# Patient Record
Sex: Female | Born: 1935 | Race: Black or African American | Hispanic: No | Smoking: Current every day smoker
Health system: Southern US, Community
[De-identification: ages and names within clinical notes are randomized; demographics above are authoritative.]

## PROBLEM LIST (undated history)

## (undated) DIAGNOSIS — I10 Essential (primary) hypertension: Secondary | ICD-10-CM

## (undated) DIAGNOSIS — K644 Residual hemorrhoidal skin tags: Secondary | ICD-10-CM

## (undated) DIAGNOSIS — R7303 Prediabetes: Secondary | ICD-10-CM

## (undated) DIAGNOSIS — C50919 Malignant neoplasm of unspecified site of unspecified female breast: Secondary | ICD-10-CM

## (undated) DIAGNOSIS — R42 Dizziness and giddiness: Secondary | ICD-10-CM

## (undated) DIAGNOSIS — R0600 Dyspnea, unspecified: Secondary | ICD-10-CM

## (undated) DIAGNOSIS — R001 Bradycardia, unspecified: Secondary | ICD-10-CM

## (undated) DIAGNOSIS — K219 Gastro-esophageal reflux disease without esophagitis: Secondary | ICD-10-CM

## (undated) DIAGNOSIS — Z8719 Personal history of other diseases of the digestive system: Secondary | ICD-10-CM

## (undated) DIAGNOSIS — K5790 Diverticulosis of intestine, part unspecified, without perforation or abscess without bleeding: Secondary | ICD-10-CM

## (undated) DIAGNOSIS — E785 Hyperlipidemia, unspecified: Secondary | ICD-10-CM

## (undated) DIAGNOSIS — M069 Rheumatoid arthritis, unspecified: Secondary | ICD-10-CM

## (undated) DIAGNOSIS — R0609 Other forms of dyspnea: Secondary | ICD-10-CM

## (undated) DIAGNOSIS — R002 Palpitations: Secondary | ICD-10-CM

## (undated) DIAGNOSIS — R195 Other fecal abnormalities: Secondary | ICD-10-CM

## (undated) DIAGNOSIS — R519 Headache, unspecified: Secondary | ICD-10-CM

## (undated) DIAGNOSIS — R51 Headache: Secondary | ICD-10-CM

## (undated) DIAGNOSIS — R55 Syncope and collapse: Secondary | ICD-10-CM

## (undated) DIAGNOSIS — H269 Unspecified cataract: Secondary | ICD-10-CM

## (undated) DIAGNOSIS — Z8709 Personal history of other diseases of the respiratory system: Secondary | ICD-10-CM

## (undated) HISTORY — DX: Personal history of other diseases of the digestive system: Z87.19

## (undated) HISTORY — DX: Rheumatoid arthritis, unspecified: M06.9

## (undated) HISTORY — PX: EYE SURGERY: SHX253

## (undated) HISTORY — DX: Essential (primary) hypertension: I10

## (undated) HISTORY — DX: Unspecified cataract: H26.9

## (undated) HISTORY — PX: TONSILLECTOMY: SUR1361

## (undated) HISTORY — DX: Gastro-esophageal reflux disease without esophagitis: K21.9

## (undated) HISTORY — PX: POLYPECTOMY: SHX149

---

## 1968-01-24 HISTORY — PX: VAGINAL HYSTERECTOMY: SUR661

## 1991-01-24 HISTORY — PX: CYSTECTOMY: SUR359

## 1993-01-23 HISTORY — PX: MASTECTOMY: SHX3

## 1994-01-23 DIAGNOSIS — C50919 Malignant neoplasm of unspecified site of unspecified female breast: Secondary | ICD-10-CM

## 1994-01-23 HISTORY — DX: Malignant neoplasm of unspecified site of unspecified female breast: C50.919

## 1997-07-16 ENCOUNTER — Other Ambulatory Visit: Admission: RE | Admit: 1997-07-16 | Discharge: 1997-07-16 | Payer: Self-pay | Admitting: Hematology and Oncology

## 2000-04-19 ENCOUNTER — Encounter: Payer: Self-pay | Admitting: Emergency Medicine

## 2000-04-19 ENCOUNTER — Emergency Department (HOSPITAL_COMMUNITY): Admission: EM | Admit: 2000-04-19 | Discharge: 2000-04-19 | Payer: Self-pay | Admitting: Emergency Medicine

## 2000-05-24 ENCOUNTER — Encounter: Admission: RE | Admit: 2000-05-24 | Discharge: 2000-08-22 | Payer: Self-pay | Admitting: Orthopedic Surgery

## 2000-07-04 ENCOUNTER — Encounter: Payer: Self-pay | Admitting: General Surgery

## 2000-07-04 ENCOUNTER — Encounter: Admission: RE | Admit: 2000-07-04 | Discharge: 2000-07-04 | Payer: Self-pay | Admitting: General Surgery

## 2001-05-01 ENCOUNTER — Encounter: Admission: RE | Admit: 2001-05-01 | Discharge: 2001-05-01 | Payer: Self-pay | Admitting: Internal Medicine

## 2001-05-01 ENCOUNTER — Encounter (HOSPITAL_BASED_OUTPATIENT_CLINIC_OR_DEPARTMENT_OTHER): Payer: Self-pay | Admitting: Internal Medicine

## 2001-11-28 ENCOUNTER — Encounter: Admission: RE | Admit: 2001-11-28 | Discharge: 2002-01-06 | Payer: Self-pay | Admitting: Internal Medicine

## 2003-01-24 DIAGNOSIS — R195 Other fecal abnormalities: Secondary | ICD-10-CM

## 2003-01-24 HISTORY — DX: Other fecal abnormalities: R19.5

## 2003-06-12 ENCOUNTER — Emergency Department (HOSPITAL_COMMUNITY): Admission: EM | Admit: 2003-06-12 | Discharge: 2003-06-12 | Payer: Self-pay | Admitting: Emergency Medicine

## 2003-06-30 ENCOUNTER — Encounter: Admission: RE | Admit: 2003-06-30 | Discharge: 2003-06-30 | Payer: Self-pay | Admitting: Internal Medicine

## 2005-06-06 ENCOUNTER — Ambulatory Visit: Payer: Self-pay | Admitting: Internal Medicine

## 2005-06-12 ENCOUNTER — Ambulatory Visit: Payer: Self-pay | Admitting: Internal Medicine

## 2005-06-12 ENCOUNTER — Encounter (INDEPENDENT_AMBULATORY_CARE_PROVIDER_SITE_OTHER): Payer: Self-pay | Admitting: *Deleted

## 2006-01-20 ENCOUNTER — Emergency Department (HOSPITAL_COMMUNITY): Admission: EM | Admit: 2006-01-20 | Discharge: 2006-01-20 | Payer: Self-pay | Admitting: Emergency Medicine

## 2006-08-31 ENCOUNTER — Ambulatory Visit (HOSPITAL_COMMUNITY): Admission: RE | Admit: 2006-08-31 | Discharge: 2006-08-31 | Payer: Self-pay | Admitting: Internal Medicine

## 2008-03-26 ENCOUNTER — Observation Stay (HOSPITAL_COMMUNITY): Admission: EM | Admit: 2008-03-26 | Discharge: 2008-03-27 | Payer: Self-pay | Admitting: Emergency Medicine

## 2010-02-13 ENCOUNTER — Encounter: Payer: Self-pay | Admitting: Diagnostic Radiology

## 2010-05-05 LAB — DIFFERENTIAL
Basophils Absolute: 0 10*3/uL (ref 0.0–0.1)
Basophils Relative: 0 % (ref 0–1)
Eosinophils Absolute: 0 10*3/uL (ref 0.0–0.7)
Eosinophils Relative: 0 % (ref 0–5)
Monocytes Absolute: 0.2 10*3/uL (ref 0.1–1.0)
Neutrophils Relative %: 90 % — ABNORMAL HIGH (ref 43–77)

## 2010-05-05 LAB — CARDIAC PANEL(CRET KIN+CKTOT+MB+TROPI)
CK, MB: 2.2 ng/mL (ref 0.3–4.0)
CK, MB: 3.7 ng/mL (ref 0.3–4.0)
Relative Index: 0.7 (ref 0.0–2.5)
Relative Index: 1 (ref 0.0–2.5)
Troponin I: <0.01 ng/mL (ref 0.00–0.06)

## 2010-05-05 LAB — POCT CARDIAC MARKERS: Myoglobin, poc: 98.4 ng/mL (ref 12–200)

## 2010-05-05 LAB — CBC
HCT: 36 % (ref 36.0–46.0)
Hemoglobin: 12.1 g/dL (ref 12.0–15.0)
MCHC: 33.7 g/dL (ref 30.0–36.0)
MCV: 93.2 fL (ref 78.0–100.0)
RBC: 3.86 MIL/uL — ABNORMAL LOW (ref 3.87–5.11)
WBC: 9.5 10*3/uL (ref 4.0–10.5)

## 2010-05-05 LAB — BASIC METABOLIC PANEL
BUN: 10 mg/dL (ref 6–23)
GFR calc Af Amer: >60 mL/min (ref >60)
GFR calc non Af Amer: >60 mL/min (ref >60)
Potassium: 3.4 mEq/L — ABNORMAL LOW (ref 3.5–5.1)

## 2010-05-05 LAB — CK TOTAL AND CKMB (NOT AT ARMC): Total CK: 92 U/L (ref 7–177)

## 2010-05-05 LAB — PROTIME-INR: INR: 1.1 (ref 0.00–1.49)

## 2010-06-07 NOTE — Discharge Summary (Signed)
NAME:  Carol Bright, Carol Bright NO.:  [id number]   MEDICAL RECORD NO.:  [id number]          PATIENT TYPE:  INP   LOCATION:  3707                         FACILITY:  [facility]   PHYSICIAN:  Barry Dienes. Eloise Harman, M.D.DATE OF BIRTH:  05/29/1930   DATE OF ADMISSION:  03/26/2008  DATE OF DISCHARGE:  03/27/2008                               DISCHARGE SUMMARY   PERTINENT FINDINGS:  The patient is a 77 year old African American woman  who has had urticaria for the past few days.  Today, she awoke with  intermittent substernal chest pressure, nausea, and a mildly productive  cough.  The chest pressure was mild and would last less than 5 seconds  and resolved spontaneously.  It was not worse with exertion.  The chest  discomfort had resolved by the time I saw her in the emergency room.  She has no history of coronary artery disease and there is no family  history of premature coronary artery disease.   PAST MEDICAL HISTORY:  In 1996, left breast carcinoma, tobacco use that  is ongoing, and rheumatoid arthritis as well as hypertension.   MEDICATIONS PRIOR TO ADMISSION:  1. Benadryl 50 mg p.o. q.i.d. p.r.n. itching.  2. Folate 2 mg p.o. daily.  3. Methotrexate 2.5 mg 8 tablets p.o. once weekly.  4. Prednisone 5 mg daily, long term.  5. Maxzide 25 one tab p.o. daily.  6. Caltrate D 600 one tab twice daily.  7. Centrum Silver 1 tab daily.   ALLERGIES:  PENICILLIN and CARDIZEM has been associated with dizziness  and LOVASTATIN has been associated with leg pain.   PAST SURGICAL HISTORY:  In 1971, total abdominal hysterectomy; in 1993,  left shoulder lipoma resection; in 1996, left modified radical  mastectomy; and in 2002, hemorrhoidectomy.   FAMILY HISTORY:  Her mother died at age 9 of myocardial infarction.  She also has had a history of breast cancer.  There is no family history  of premature coronary artery disease.   SOCIAL HISTORY:  She has been a widow since March 25, 2003.  She  is  retired.  She has ongoing tobacco use of 2 cigarettes daily and no  history of alcohol abuse.  She lives with her daughter.   REVIEW OF SYSTEMS:  She has had itching, which is moderately severe over  the past 3 days.  She has had a tactile temperature and a mildly  productive cough.  She has not had palpitations, nausea, vomiting,  diarrhea, or constipation.  She has arthralgias that affect both of her  feet and knees and both of her wrists.   INITIAL PHYSICAL EXAMINATION:  VITAL SIGNS:  Blood pressure 127/81,  pulse 88, respirations 24, temperature 97.1, and pulse oxygen saturation  98% on room air.  GENERAL:  She is a well-nourished, well-developed black female who is in  no apparent distress.  HEAD, EYES, EARS, NOSE, AND THROAT:  Within normal limits.  NECK:  Supple without jugular venous distention or carotid bruit.  CHEST:  Clear to auscultation.  HEART:  Regular rate and rhythm with a systolic ejection murmur of  grade  1/6 at the left sternal border.  ABDOMEN:  Normal bowel sounds with no hepatosplenomegaly or tenderness.  EXTREMITIES:  Without cyanosis, clubbing, or edema.  NEUROLOGIC:  She was alert and well oriented with a normal affect,  cranial nerves II-XII were normal, she was able to move all extremities  well.   INITIAL LABORATORY STUDIES:  White blood cell count 9.5, hemoglobin 12,  hematocrit 36, and platelets 306.  Serum sodium 138, potassium 3.4,  chloride 107, carbon dioxide 24, BUN 10, creatinine 0.89, and glucose  121.  Troponin I was less than 0.05.  CK-MB was less than 1.0.  A chest  x-ray showed cardiomegaly only.  EKG showed the following:  1. Normal sinus rhythm.  2. Borderline first-degree AV block.   HOSPITAL COURSE:  The patient was admitted to a medical bed with  telemetry.  She showed no arrhythmia while on telemetry.  She had serial  cardiac isoenzymes including a total CPK levels of 92, 227, and 536 with  CK-MB levels of 1.1, 2.2, and 3.7  respectively.  She also had serum  troponin I levels of less than 0.05, less than 0.01, and less than 0.01.  She had some diffuse body itching that improved and it eventually  resolved with high-dose Solu-Medrol.  She had no recurrence of her  substernal chest pain.  She has been eating well and walking well  without difficulty.  Most recent physical exam includes vital signs of  blood pressure 112/61, pulse 76, respirations 20, and temperature 98.1  with pulse oxygen saturation 96% on room air.  In general, she is an  elderly black female who is in no apparent distress.  Chest was clear to  auscultation.  Heart had a regular rate and rhythm with a systolic  ejection murmur of grade 1/6 at the left sternal border.  Abdomen had  normal bowel sounds and no hepatosplenomegaly or tenderness.  Extremities were without cyanosis, clubbing, or edema.  Most recent 12-  lead EKG shows normal sinus rhythm with first-degree AV block.   DISCHARGE DIAGNOSES:  1. Chest pain, precordial, with no evidence of myocardial infarction.  2. Urticaria, improved.  3. Acute bronchitis, improved.  4. Emphysema.  5. In 1996, left-sided breast adenocarcinoma.  6. Rheumatoid arthritis.  7. Hypertension.  8. Elevated CPK levels, likely secondary to myositis from whole body      scratching.   DISCHARGE MEDICATIONS:  1. Prednisone 20 mg 3 tablets daily for 2 days, then 2 tablets daily      for 2 days, then 1 tablet daily for 2 days, then 1/2 tablet daily      for 2 days, then resume prednisone 5 mg daily.  2. Benadryl 25 mg p.o. q.i.d. p.r.n. itching.  3. Maxzide 25 one tab p.o. q.a.m.  4. Caltrate D 600 twice daily.  5. Centrum Silver once daily.  6. Folate 2 mg daily.  7. Methotrexate 2.5 mg tablets take 8 tablets once weekly.  8. Ecotrin 81 mg once daily.  9. Albuterol HFA 2 puffs q.i.d. p.r.n. wheezing or cough.  10.Robitussin DM as needed for cough.   DISPOSITION AND FOLLOWUP:  She will be discharged to  home.  She was  advised to schedule a followup appointment with Dr. Eloise Harman in  approximately 1-2 weeks following discharge.           ______________________________  Barry Dienes. Eloise Harman, M.D.     DGP/MEDQ  D:  03/27/2008  T:  03/28/2008  Job:  [id number]   cc:   Aundra Dubin, M.D.

## 2010-06-17 ENCOUNTER — Emergency Department (HOSPITAL_COMMUNITY)
Admission: EM | Admit: 2010-06-17 | Discharge: 2010-06-18 | Disposition: A | Discharge status: Home / Self Care | Payer: Medicare Other | Attending: Emergency Medicine | Admitting: Emergency Medicine

## 2010-06-17 ENCOUNTER — Emergency Department (HOSPITAL_COMMUNITY): Payer: Medicare Other

## 2010-06-17 DIAGNOSIS — E785 Hyperlipidemia, unspecified: Secondary | ICD-10-CM | POA: Insufficient documentation

## 2010-06-17 DIAGNOSIS — M069 Rheumatoid arthritis, unspecified: Secondary | ICD-10-CM | POA: Insufficient documentation

## 2010-06-17 DIAGNOSIS — I10 Essential (primary) hypertension: Secondary | ICD-10-CM | POA: Insufficient documentation

## 2010-06-17 DIAGNOSIS — Z79899 Other long term (current) drug therapy: Secondary | ICD-10-CM | POA: Insufficient documentation

## 2010-06-17 DIAGNOSIS — M25519 Pain in unspecified shoulder: Secondary | ICD-10-CM | POA: Insufficient documentation

## 2010-07-02 ENCOUNTER — Encounter: Payer: Self-pay | Admitting: Internal Medicine

## 2010-12-23 ENCOUNTER — Encounter: Payer: Self-pay | Admitting: Internal Medicine

## 2010-12-26 ENCOUNTER — Ambulatory Visit (INDEPENDENT_AMBULATORY_CARE_PROVIDER_SITE_OTHER): Payer: Medicare Other | Admitting: Internal Medicine

## 2010-12-26 ENCOUNTER — Institutional Professional Consult (permissible substitution): Payer: Medicare Other | Admitting: Internal Medicine

## 2010-12-26 ENCOUNTER — Encounter: Payer: Self-pay | Admitting: Internal Medicine

## 2010-12-26 DIAGNOSIS — F17200 Nicotine dependence, unspecified, uncomplicated: Secondary | ICD-10-CM

## 2010-12-26 DIAGNOSIS — R0989 Other specified symptoms and signs involving the circulatory and respiratory systems: Secondary | ICD-10-CM

## 2010-12-26 DIAGNOSIS — R079 Chest pain, unspecified: Secondary | ICD-10-CM

## 2010-12-26 DIAGNOSIS — E785 Hyperlipidemia, unspecified: Secondary | ICD-10-CM

## 2010-12-26 DIAGNOSIS — Z720 Tobacco use: Secondary | ICD-10-CM

## 2010-12-26 DIAGNOSIS — I10 Essential (primary) hypertension: Secondary | ICD-10-CM

## 2010-12-26 NOTE — Progress Notes (Signed)
Patient ID: Carol Bright, female   DOB: [birth year], 77 y.o.   MRN: [id number] HPI  Patient is a 77 year old who is followed by D. Jarold Motto For the past 2 months has had intermitt Chest pains with exertion (mopping)  Does have shoulder pain on L side. Gets occasional SOB with this. At rest can feel occasionally a little something runnign through chest. Doesn't do a lot of walking because of arthritis. Has been on prednisone for years.MTX added  Allergies  Allergen Reactions  . Penicillins     Current Outpatient Prescriptions  Medication Sig Dispense Refill  . cetirizine (ZYRTEC) 10 MG tablet Take 10 mg by mouth daily.        . folic acid (FOLVITE) 1 MG tablet 2 TABS DAILY       . methotrexate (RHEUMATREX) 2.5 MG tablet Take 2.5 mg by mouth once a week. Caution:Chemotherapy. Protect from light. ( Pt takes 10 TABS ON SUN)       . Multiple Vitamins-Minerals (CENTRUM SILVER PO) Take by mouth. 1 TAB DAILY       . NON FORMULARY Vitamin D3  1 tab weekly  50,000 units       . OVER THE COUNTER MEDICATION CALICUM WITH VITAMIN D 1 TAB DAILY       . predniSONE (DELTASONE) 5 MG tablet Take 5 mg by mouth daily.        Marland Kitchen triamterene-hydrochlorothiazide (MAXZIDE-25) 37.5-25 MG per tablet Take 1 tablet by mouth daily.          Past Medical History  Diagnosis Date  . Hypertension   . Rheumatoid arthritis     PT IS FOLLOWED BY DR Miachel Roux [facility]  . Cancer     BREAST CANCER -LEFT BREAST REMOVED -  1996    History reviewed. No pertinent past surgical history.  Family History  Problem Relation Age of Onset  . Cancer Mother     LEFT BREAST REMOVED  . Heart attack Mother   . Heart disease Mother     History   Social History  . Marital Status: Widowed    Spouse Name: N/A    Number of Children: N/A  . Years of Education: N/A   Occupational History  . Not on file.   Social History Main Topics  . Smoking status: Current Everyday Smoker -- 0.5 packs/day    Types: Cigarettes  .  Smokeless tobacco: Not on file  . Alcohol Use:   . Drug Use: No  . Sexually Active: No   Other Topics Concern  . Not on file   Social History Narrative  . No narrative on file    Review of Systems:  All systems reviewed.  They are negative to the above problem except as previously stated.  Vital Signs: BP 148/81  Pulse 70  Ht 4\' 11"  (1.499 m)  Wt 141 lb (63.957 kg)  BMI 28.48 kg/m2  Physical Exam Patient is in NAD.  HEENT:  Normocephalic, atraumatic. EOMI, PERRLA.  Neck: JVP is normal. No thyromegaly. Question R bruit Lungs: clear to auscultation. No rales no wheezes.  Heart: Regular rate and rhythm. Normal S1, S2. No S3.   No significant murmurs. PMI not displaced. Chest:  Some tenderness with movement of chest.  Abdomen:  Supple, nontender. Normal bowel sounds. No masses. No hepatomegaly.  Extremities:   Good distal pulses throughout. No lower extremity edema.  Musculoskeletal :moving all extremities.  Neuro:   alert and oriented x3.  CN II-XII grossly intact.  Assessment and Plan:

## 2010-12-26 NOTE — Patient Instructions (Signed)
Your physician has requested that you have a lexiscan myoview. For further information please visit [email or web address] Please follow instruction sheet, as given.  Your physician has requested that you have a carotid duplex. This test is an ultrasound of the carotid arteries in your neck. It looks at blood flow through these arteries that supply the brain with blood. Allow one hour for this exam. There are no restrictions or special instructions.

## 2010-12-27 DIAGNOSIS — E785 Hyperlipidemia, unspecified: Secondary | ICD-10-CM | POA: Insufficient documentation

## 2010-12-27 DIAGNOSIS — I10 Essential (primary) hypertension: Secondary | ICD-10-CM | POA: Insufficient documentation

## 2010-12-27 DIAGNOSIS — R0789 Other chest pain: Secondary | ICD-10-CM | POA: Insufficient documentation

## 2010-12-27 DIAGNOSIS — Z720 Tobacco use: Secondary | ICD-10-CM | POA: Insufficient documentation

## 2010-12-27 NOTE — Assessment & Plan Note (Signed)
Follow

## 2010-12-27 NOTE — Assessment & Plan Note (Addendum)
Patient's exertional CP is concerning.  I am not convinced it is cardiac.  There may be association with movement of UE/shoulder with her RA.  But, she has longstanding prednisone use and her lipids are increased.  She is not that active  She also is quite stoic, question if symptoms are worse. I would recomm a lexiscan myoview to eval for ischemia.  acitvities as tolerated.

## 2010-12-27 NOTE — Assessment & Plan Note (Signed)
Lipid are not opimal with LDL of 147.  It sounds like she has been on a statin in the past. Question bruit on R ICA region.  Will sched carotid USN to evaluate.

## 2010-12-27 NOTE — Assessment & Plan Note (Signed)
Counselled on cessation. 

## 2011-01-02 ENCOUNTER — Ambulatory Visit (HOSPITAL_COMMUNITY): Payer: Medicare Other | Attending: Internal Medicine | Admitting: Radiology

## 2011-01-02 DIAGNOSIS — M25519 Pain in unspecified shoulder: Secondary | ICD-10-CM | POA: Insufficient documentation

## 2011-01-02 DIAGNOSIS — F17200 Nicotine dependence, unspecified, uncomplicated: Secondary | ICD-10-CM | POA: Insufficient documentation

## 2011-01-02 DIAGNOSIS — R0602 Shortness of breath: Secondary | ICD-10-CM | POA: Insufficient documentation

## 2011-01-02 DIAGNOSIS — I10 Essential (primary) hypertension: Secondary | ICD-10-CM | POA: Insufficient documentation

## 2011-01-02 DIAGNOSIS — R5381 Other malaise: Secondary | ICD-10-CM | POA: Insufficient documentation

## 2011-01-02 DIAGNOSIS — R0609 Other forms of dyspnea: Secondary | ICD-10-CM | POA: Insufficient documentation

## 2011-01-02 DIAGNOSIS — R5383 Other fatigue: Secondary | ICD-10-CM

## 2011-01-02 DIAGNOSIS — R079 Chest pain, unspecified: Secondary | ICD-10-CM

## 2011-01-02 DIAGNOSIS — E785 Hyperlipidemia, unspecified: Secondary | ICD-10-CM | POA: Insufficient documentation

## 2011-01-02 DIAGNOSIS — R42 Dizziness and giddiness: Secondary | ICD-10-CM | POA: Insufficient documentation

## 2011-01-02 DIAGNOSIS — R0989 Other specified symptoms and signs involving the circulatory and respiratory systems: Secondary | ICD-10-CM | POA: Insufficient documentation

## 2011-01-02 MED ORDER — TECHNETIUM TC 99M TETROFOSMIN IV KIT: 33.0000 | PACK | Freq: Once | INTRAVENOUS | Status: DC | PRN

## 2011-01-02 MED ORDER — TECHNETIUM TC 99M TETROFOSMIN IV KIT: 11.0000 | PACK | Freq: Once | INTRAVENOUS | Status: DC | PRN

## 2011-01-02 MED ORDER — TECHNETIUM TC 99M TETROFOSMIN IV KIT
11.0000 | PACK | Freq: Once | INTRAVENOUS | Status: DC | PRN
  Administered 2011-01-02: 11 via INTRAVENOUS

## 2011-01-02 MED ORDER — TECHNETIUM TC 99M TETROFOSMIN IV KIT
33.0000 | PACK | Freq: Once | INTRAVENOUS | Status: DC | PRN
  Administered 2011-01-02: 33 via INTRAVENOUS

## 2011-01-02 MED ORDER — REGADENOSON 0.4 MG/5ML IV SOLN: 0.4000 mg | Freq: Once | INTRAVENOUS | Status: DC

## 2011-01-02 MED ORDER — AMINOPHYLLINE 25 MG/ML IV SOLN
75.0000 mg | Freq: Once | INTRAVENOUS | Status: AC
  Administered 2011-01-02: 75 mg via INTRAVENOUS

## 2011-01-02 MED ORDER — REGADENOSON 0.4 MG/5ML IV SOLN
0.4000 mg | Freq: Once | INTRAVENOUS | Status: AC
  Administered 2011-01-02: 0.4 mg via INTRAVENOUS

## 2011-01-02 NOTE — Progress Notes (Signed)
[facility] SITE 3 NUCLEAR MED [address] [place] [address] [phone]  Cardiology Nuclear Med Study  Carol Bright is a 77 y.o. female [id number] 1930-10-21   Nuclear Med Background Indication for Stress Test:  Evaluation for Ischemia History:  No previous documented CAD Cardiac Risk Factors: Family History - CAD, Hypertension, Lipids and Smoker  Symptoms:  Chest Pressure with Exertion radiates to left Shoulder (last date of chest discomfort yesterday), Dizziness, DOE, Fatigue, Fatigue with Exertion and SOB   Nuclear Pre-Procedure Caffeine/Decaff Intake:  None NPO After: 9:00pm   Lungs:  Clear IV 0.9% NS with Angio Cath:  22g  IV Site: R Forearm x 1,tolerated well IV Started by:  Irean Hong, RN  Chest Size (in):  36 Cup Size: D  Height: 4\' 11"  (1.499 m)  Weight:  140 lb (63.504 kg)  BMI:  Body mass index is 28.28 kg/(m^2). Tech Comments:  N/A    Nuclear Med Study 1 or 2 day study: 1 day  Stress Test Type:  Lexiscan  Reading MD: Dietrich Pates, MD  Order Authorizing Provider:  Dietrich Pates, MD  Resting Radionuclide: Technetium 47m Tetrofosmin  Resting Radionuclide Dose: 11.0 mCi   Stress Radionuclide:  Technetium 44m Tetrofosmin  Stress Radionuclide Dose: 33.0 mCi           Stress Protocol Rest HR: 56 Stress HR: 88  Rest BP: 116/60 Stress BP: 112/78  Exercise Time (min): n/a METS: n/a   Predicted Max HR: 142 bpm % Max HR: 61.97 bpm Rate Pressure Product: 9856   Dose of Adenosine (mg):  n/a Dose of Lexiscan: 0.4 mg  Dose of Atropine (mg): n/a Dose of Dobutamine: n/a mcg/kg/min (at max HR)  Stress Test Technologist: Irean Hong, RN  Nuclear Technologist:  Domenic Polite, CNMT     Rest Procedure:  Myocardial perfusion imaging was performed at rest 45 minutes following the intravenous administration of Technetium 44m Tetrofosmin. Rest ECG: SB with marked sinus arrhythmia, 1st degree AVB, and minimal early replorization  Stress Procedure:   The patient received IV Lexiscan 0.4 mg over 15-seconds.  Technetium 87m Tetrofosmin injected at 30-seconds.  The EKG was non-diagnostic due to baseline changes. There were occasional PAC's, rare PJC.  The patient complained of extreme fatigue, and marked body aches 8 minutes in recovery. Aminophylline 75 mg IV push given with rapid resolution of symptoms.  Quantitative spect images were obtained after a 45 minute delay. Stress ECG: No significant change from baseline ECG  QPS Raw Data Images:  Soft tissue (diaphragm, bowel activity) underlie heart. Stress Images:  Normal homogeneous uptake in all areas of the myocardium. Rest Images:  Normal homogeneous uptake in all areas of the myocardium. Subtraction (SDS):  No evidence of ischemia. Transient Ischemic Dilatation (Normal <1.22):  1.03 Lung/Heart Ratio (Normal <0.45):  0.30  Quantitative Gated Spect Images QGS EDV:  65 ml QGS ESV:  23 ml QGS cine images:  NL LV Function; NL Wall Motion QGS EF: 65%  Impression Exercise Capacity:  Lexiscan with no exercise. BP Response:  Normal blood pressure response. Clinical Symptoms:  Significant chest pain. ECG Impression:  No significant ST segment change suggestive of ischemia. Comparison with Prior Nuclear Study: No previous nuclear study performed  Overall Impression:  Normal stress nuclear study. LVEF 65% Dietrich Pates

## 2011-01-23 ENCOUNTER — Encounter (INDEPENDENT_AMBULATORY_CARE_PROVIDER_SITE_OTHER): Payer: Medicare Other | Admitting: *Deleted

## 2011-01-23 DIAGNOSIS — I6529 Occlusion and stenosis of unspecified carotid artery: Secondary | ICD-10-CM

## 2011-01-23 DIAGNOSIS — R0989 Other specified symptoms and signs involving the circulatory and respiratory systems: Secondary | ICD-10-CM

## 2011-02-03 ENCOUNTER — Other Ambulatory Visit: Payer: Self-pay | Admitting: *Deleted

## 2011-02-03 DIAGNOSIS — E782 Mixed hyperlipidemia: Secondary | ICD-10-CM

## 2011-02-03 MED ORDER — SIMVASTATIN 20 MG PO TABS: 20.0000 mg | ORAL_TABLET | Freq: Every evening | ORAL | Status: DC

## 2011-04-04 ENCOUNTER — Other Ambulatory Visit (INDEPENDENT_AMBULATORY_CARE_PROVIDER_SITE_OTHER): Payer: Medicare Other

## 2011-04-04 DIAGNOSIS — E782 Mixed hyperlipidemia: Secondary | ICD-10-CM

## 2011-04-04 LAB — LIPID PANEL
LDL Cholesterol: 109 mg/dL — ABNORMAL HIGH (ref 0–99)
Total CHOL/HDL Ratio: 3
Triglycerides: 59 mg/dL (ref 0.0–149.0)

## 2011-04-04 LAB — AST: AST: 18 U/L (ref 0–37)

## 2011-04-19 ENCOUNTER — Other Ambulatory Visit: Payer: Self-pay | Admitting: *Deleted

## 2011-04-19 DIAGNOSIS — E7800 Pure hypercholesterolemia, unspecified: Secondary | ICD-10-CM

## 2011-04-19 MED ORDER — ATORVASTATIN CALCIUM 20 MG PO TABS: 20.0000 mg | ORAL_TABLET | Freq: Every day | ORAL | Status: DC

## 2011-05-08 ENCOUNTER — Telehealth: Payer: Self-pay | Admitting: *Deleted

## 2011-05-08 NOTE — Telephone Encounter (Signed)
Patient's daughter had reported that she was feeling achy in her joints since starting Lipitor. Dr.Ross advised that we could hold medication and check response in a few weeks. I called the patient and she told me that she thought it was her arthritis that had flared up because now she feels fine. Will stay on Lipitor and let us know if anything changes. I will let Dr.Ross know about update.

## 2011-06-04 ENCOUNTER — Emergency Department (INDEPENDENT_AMBULATORY_CARE_PROVIDER_SITE_OTHER): Payer: Medicare Other

## 2011-06-04 ENCOUNTER — Encounter (HOSPITAL_COMMUNITY): Payer: Self-pay | Admitting: *Deleted

## 2011-06-04 ENCOUNTER — Emergency Department (HOSPITAL_COMMUNITY)
Admission: EM | Admit: 2011-06-04 | Discharge: 2011-06-04 | Disposition: A | Discharge status: Home / Self Care | Payer: Medicare Other | Source: Home / Self Care | Attending: Family Medicine | Admitting: Family Medicine

## 2011-06-04 DIAGNOSIS — M67919 Unspecified disorder of synovium and tendon, unspecified shoulder: Secondary | ICD-10-CM

## 2011-06-04 DIAGNOSIS — M719 Bursopathy, unspecified: Secondary | ICD-10-CM

## 2011-06-04 DIAGNOSIS — M7552 Bursitis of left shoulder: Secondary | ICD-10-CM

## 2011-06-04 DIAGNOSIS — J410 Simple chronic bronchitis: Secondary | ICD-10-CM

## 2011-06-04 DIAGNOSIS — J40 Bronchitis, not specified as acute or chronic: Secondary | ICD-10-CM

## 2011-06-04 HISTORY — DX: Hyperlipidemia, unspecified: E78.5

## 2011-06-04 MED ORDER — DOXYCYCLINE HYCLATE 100 MG PO CAPS: 100.0000 mg | ORAL_CAPSULE | Freq: Two times a day (BID) | ORAL | Status: AC

## 2011-06-04 MED ORDER — DEXTROMETHORPHAN POLISTIREX 30 MG/5ML PO LQCR: 60.0000 mg | ORAL | Status: AC | PRN

## 2011-06-04 MED ORDER — DICLOFENAC SODIUM 1 % TD GEL: 1.0000 "application " | Freq: Four times a day (QID) | TRANSDERMAL | Status: DC

## 2011-06-04 NOTE — Discharge Instructions (Signed)
Take all of medicine, drink lots of fluids, no more smoking, see your doctor if further problems  °

## 2011-06-04 NOTE — ED Provider Notes (Signed)
History     CSN: [id number]  Arrival date & time 06/04/11  7829   First MD Initiated Contact with Patient 06/04/11 [phone]      Chief Complaint  Patient presents with  . Shoulder Pain  . Cough    (Consider location/radiation/quality/duration/timing/severity/associated sxs/prior treatment) Patient is a 78 y.o. female presenting with shoulder pain and cough. The history is provided by the patient and a relative.  Shoulder Pain This is a new problem. The current episode started more than 1 week ago. The problem has been gradually worsening. Associated symptoms include chest pain. Associated symptoms comments: Bad cough, using 3 bottles robitussin, is a smoker.. The symptoms are aggravated by coughing.  Cough Associated symptoms include chest pain.    Past Medical History  Diagnosis Date  . Hypertension   . Cancer     BREAST CANCER -LEFT BREAST REMOVED -  1996  . Rheumatoid arthritis     PT IS FOLLOWED BY DR Miachel Roux [facility]  . Hyperlipidemia     Past Surgical History  Procedure Date  . Mastectomy     Left  . Cystectomy     Left shoulder    Family History  Problem Relation Age of Onset  . Cancer Mother     LEFT BREAST REMOVED  . Heart attack Mother   . Heart disease Mother     History  Substance Use Topics  . Smoking status: Current Everyday Smoker -- 0.2 packs/day    Types: Cigarettes  . Smokeless tobacco: Not on file  . Alcohol Use: Yes     occasional    OB History    Grav Para Term Preterm Abortions TAB SAB Ect Mult Living                  Review of Systems  Constitutional: Negative.   HENT: Negative.   Respiratory: Positive for cough.   Cardiovascular: Positive for chest pain.  Gastrointestinal: Negative.   Musculoskeletal: Positive for joint swelling.  Skin: Negative.     Allergies  Penicillins  Home Medications   Current Outpatient Rx  Name Route Sig Dispense Refill  . ATORVASTATIN CALCIUM 20 MG PO TABS Oral Take 1 tablet (20  mg total) by mouth daily. 30 tablet 6  . FOLIC ACID 1 MG PO TABS  2 TABS DAILY     . METHOTREXATE 2.5 MG PO TABS Oral Take 2.5 mg by mouth once a week. Caution:Chemotherapy. Protect from light. ( Pt takes 10 TABS ON SUN)     . NON FORMULARY  Vitamin D3  1 tab weekly  50,000 units     . OVER THE COUNTER MEDICATION  CALICUM WITH VITAMIN D 1 TAB DAILY     . PREDNISONE 5 MG PO TABS Oral Take 1 mg by mouth 3 (three) times daily.     . TRIAMTERENE-HCTZ 37.5-25 MG PO TABS Oral Take 1 tablet by mouth daily.      Marland Kitchen CETIRIZINE HCL 10 MG PO TABS Oral Take 10 mg by mouth daily.      Marland Kitchen DEXTROMETHORPHAN POLISTIREX ER 30 MG/5ML PO LQCR Oral Take 10 mLs (60 mg total) by mouth as needed for cough. 89 mL 0  . DICLOFENAC SODIUM 1 % TD GEL Topical Apply 1 application topically 4 (four) times daily. 4gm  Per dose,    Please instruct in dosing 1 Tube 1  . DOXYCYCLINE HYCLATE 100 MG PO CAPS Oral Take 1 capsule (100 mg total) by mouth 2 (two) times daily.  20 capsule 0  . CENTRUM SILVER PO Oral Take by mouth. 1 TAB DAILY       BP 140/61  Pulse 58  Temp(Src) 98.5 F (36.9 C) (Oral)  Resp 18  SpO2 95%  Physical Exam  Nursing note and vitals reviewed. Constitutional: She is oriented to person, place, and time. She appears well-developed and well-nourished.  Cardiovascular: Normal rate, regular rhythm, normal heart sounds and intact distal pulses.   Pulmonary/Chest: Breath sounds normal.  Musculoskeletal: She exhibits tenderness.       Left shoulder: She exhibits decreased range of motion, tenderness, bony tenderness and swelling. She exhibits normal pulse and normal strength.  Neurological: She is alert and oriented to person, place, and time.  Skin: Skin is warm and dry.    ED Course  Procedures (including critical care time)  Labs Reviewed - No data to display Dg Chest 2 View  06/04/2011  *RADIOLOGY REPORT*  Clinical Data: Cough, shoulder pain  CHEST - 2 VIEW  Comparison: 03/26/2008  Findings:  Cardiomegaly again noted.  No acute infiltrate or pleural effusion.  No pulmonary edema.  Mild hyperinflation.  Stable mild degenerative changes thoracic spine.  IMPRESSION: No active disease.  No significant change.  Original Report Authenticated By: Natasha Mead, M.D.   Dg Shoulder Left  06/04/2011  *RADIOLOGY REPORT*  Clinical Data: Swelling, pain  LEFT SHOULDER - 2+ VIEW  Comparison: 01/20/2006  Findings: Three views of the left shoulder submitted.  No acute fracture or subluxation.  Mild degenerative changes left AC joint. Stable mild exostosis or spurring left humeral head.  IMPRESSION: No acute fracture or subluxation.  Mild degenerative changes left AC joint.  Original Report Authenticated By: Natasha Mead, M.D.     1. Bursitis of left shoulder   2. Bronchitis due to tobacco use       MDM  X-rays reviewed and report per radiologist.         Linna Hoff, MD 06/04/11 1051

## 2011-06-04 NOTE — ED Notes (Signed)
Denies injury.  C/O left shoulder pain starting beginning of last week with significant worsening since yesterday.  + swelling to left shoulder with decreased ROM.  Also c/o persistent cough x 1.5 wks - family states patient "has gone through 2 bottles Robitussin".  Has been taking Tyl for pain.

## 2011-07-12 ENCOUNTER — Other Ambulatory Visit (INDEPENDENT_AMBULATORY_CARE_PROVIDER_SITE_OTHER): Payer: Medicare Other

## 2011-07-12 DIAGNOSIS — E7800 Pure hypercholesterolemia, unspecified: Secondary | ICD-10-CM

## 2011-07-12 LAB — LIPID PANEL
Cholesterol: 188 mg/dL (ref 0–200)
LDL Cholesterol: 106 mg/dL — ABNORMAL HIGH (ref 0–99)
Triglycerides: 82 mg/dL (ref 0.0–149.0)

## 2011-07-12 LAB — AST: AST: 15 U/L (ref 0–37)

## 2011-07-13 ENCOUNTER — Other Ambulatory Visit: Payer: Medicare Other

## 2011-07-21 ENCOUNTER — Other Ambulatory Visit: Payer: Self-pay | Admitting: *Deleted

## 2011-10-19 ENCOUNTER — Encounter: Payer: Self-pay | Admitting: Internal Medicine

## 2011-10-25 ENCOUNTER — Encounter: Payer: Self-pay | Admitting: Internal Medicine

## 2011-11-24 HISTORY — PX: COLONOSCOPY: SHX174

## 2011-12-05 ENCOUNTER — Ambulatory Visit (AMBULATORY_SURGERY_CENTER): Payer: Medicare Other

## 2011-12-05 VITALS — Ht 59.0 in | Wt 138.7 lb

## 2011-12-05 DIAGNOSIS — Z86010 Personal history of colonic polyps: Secondary | ICD-10-CM

## 2011-12-05 DIAGNOSIS — Z1211 Encounter for screening for malignant neoplasm of colon: Secondary | ICD-10-CM

## 2011-12-05 MED ORDER — MOVIPREP 100 G PO SOLR: ORAL | Status: DC

## 2011-12-19 ENCOUNTER — Ambulatory Visit (AMBULATORY_SURGERY_CENTER): Payer: Medicare Other | Admitting: Internal Medicine

## 2011-12-19 ENCOUNTER — Encounter: Payer: Self-pay | Admitting: Internal Medicine

## 2011-12-19 VITALS — BP 174/77 | HR 47 | Temp 98.4°F | Resp 23 | Ht 59.0 in | Wt 138.0 lb

## 2011-12-19 DIAGNOSIS — Z86010 Personal history of colonic polyps: Secondary | ICD-10-CM

## 2011-12-19 DIAGNOSIS — Z1211 Encounter for screening for malignant neoplasm of colon: Secondary | ICD-10-CM

## 2011-12-19 MED ORDER — SODIUM CHLORIDE 0.9 % IV SOLN: 500.0000 mL | INTRAVENOUS | Status: DC

## 2011-12-19 NOTE — Progress Notes (Signed)
1056 AOX3 . Pleased with MAC. Report to April RN

## 2011-12-19 NOTE — Progress Notes (Signed)
Patient did not experience any of the following events: a burn prior to discharge; a fall within the facility; wrong site/side/patient/procedure/implant event; or a hospital transfer or hospital admission upon discharge from the facility. (G8907) Patient did not have preoperative order for IV antibiotic SSI prophylaxis. (G8918)   Charted by April Mirts RN 

## 2011-12-19 NOTE — Patient Instructions (Addendum)

## 2011-12-19 NOTE — Progress Notes (Signed)
Pt. Left breast prosthesis in bag under stretcher.

## 2011-12-19 NOTE — Op Note (Signed)
[facility] [address] [place], [address]   COLONOSCOPY PROCEDURE REPORT  PATIENT: Carol Bright, Carol Bright  MR#: [id number] BIRTHDATE: 1930-08-26 , 81  yrs. old GENDER: Female ENDOSCOPIST: Roxy Cedar, MD REFERRED AO:[id number] Program Recall PROCEDURE DATE:  12/19/2011 PROCEDURE:   Colonoscopy, surveillance ASA CLASS:   Class II INDICATIONS:Patient's personal history of adenomatous colon polyps - index 2004; f/u 2007 w/ small adenomas. MEDICATIONS: MAC sedation, administered by CRNA and propofol (Diprivan) 150mg  IV  DESCRIPTION OF PROCEDURE:   After the risks benefits and alternatives of the procedure were thoroughly explained, informed consent was obtained.  A digital rectal exam revealed no abnormalities of the rectum.   The LB [id number]-[id number]  endoscope was introduced through the anus and advanced to the cecum, which was identified by both the appendix and ileocecal valve. No adverse events experienced.   The quality of the prep was excellent, using MoviPrep  The instrument was then slowly withdrawn as the colon was fully examined.      COLON FINDINGS: Moderate diverticulosis was noted in the ascending colon, descending colon, and sigmoid colon.   The colon was otherwise normal.  There was no diverticulosis, inflammation, polyps or cancers unless previously stated.  Retroflexed views revealed internal hemorrhoids. The time to cecum=2 minutes 58 seconds.  Withdrawal time=6 minutes 02 seconds.  The scope was withdrawn and the procedure completed. COMPLICATIONS: There were no complications.  ENDOSCOPIC IMPRESSION: 1.   Moderate diverticulosis was noted in the ascending colon, descending colon, and sigmoid colon 2.   The colon was otherwise normal  RECOMMENDATIONS: 1. Return to the care of your primary provider.  GI follow up as needed   eSigned:  Roxy Cedar, MD 12/19/2011 10:56 AM   cc: Jarome Matin, MD and The Patient

## 2011-12-20 ENCOUNTER — Telehealth: Payer: Self-pay

## 2011-12-20 NOTE — Telephone Encounter (Signed)
  Follow up Call-  Call back number 12/19/2011  Post procedure Call Back phone  # [phone]  Permission to leave phone message No  comments no answering machine     Patient questions:  Do you have a fever, pain , or abdominal swelling? no Pain Score  0 *  Have you tolerated food without any problems? yes  Have you been able to return to your normal activities? yes  Do you have any questions about your discharge instructions: Diet   no Medications  no Follow up visit  no  Do you have questions or concerns about your Care? no  Actions: * If pain score is 4 or above: No action needed, pain <4.  No problems per pt. Maw

## 2012-02-04 ENCOUNTER — Observation Stay (HOSPITAL_COMMUNITY): Payer: Medicare Other

## 2012-02-04 ENCOUNTER — Emergency Department (HOSPITAL_COMMUNITY): Payer: Medicare Other

## 2012-02-04 ENCOUNTER — Encounter (HOSPITAL_COMMUNITY): Payer: Self-pay | Admitting: Emergency Medicine

## 2012-02-04 ENCOUNTER — Observation Stay (HOSPITAL_COMMUNITY)
Admission: EM | Admit: 2012-02-04 | Discharge: 2012-02-08 | Disposition: A | Discharge status: Home / Self Care | Payer: Medicare Other | Attending: Internal Medicine | Admitting: Internal Medicine

## 2012-02-04 DIAGNOSIS — Z79899 Other long term (current) drug therapy: Secondary | ICD-10-CM | POA: Insufficient documentation

## 2012-02-04 DIAGNOSIS — Z853 Personal history of malignant neoplasm of breast: Secondary | ICD-10-CM | POA: Insufficient documentation

## 2012-02-04 DIAGNOSIS — I951 Orthostatic hypotension: Secondary | ICD-10-CM | POA: Insufficient documentation

## 2012-02-04 DIAGNOSIS — R55 Syncope and collapse: Secondary | ICD-10-CM

## 2012-02-04 DIAGNOSIS — R079 Chest pain, unspecified: Secondary | ICD-10-CM

## 2012-02-04 DIAGNOSIS — F17200 Nicotine dependence, unspecified, uncomplicated: Secondary | ICD-10-CM | POA: Insufficient documentation

## 2012-02-04 DIAGNOSIS — K219 Gastro-esophageal reflux disease without esophagitis: Secondary | ICD-10-CM | POA: Insufficient documentation

## 2012-02-04 DIAGNOSIS — I498 Other specified cardiac arrhythmias: Principal | ICD-10-CM | POA: Insufficient documentation

## 2012-02-04 DIAGNOSIS — R42 Dizziness and giddiness: Secondary | ICD-10-CM | POA: Diagnosis present

## 2012-02-04 DIAGNOSIS — E785 Hyperlipidemia, unspecified: Secondary | ICD-10-CM | POA: Insufficient documentation

## 2012-02-04 DIAGNOSIS — I10 Essential (primary) hypertension: Secondary | ICD-10-CM | POA: Insufficient documentation

## 2012-02-04 DIAGNOSIS — R109 Unspecified abdominal pain: Secondary | ICD-10-CM | POA: Insufficient documentation

## 2012-02-04 DIAGNOSIS — I2510 Atherosclerotic heart disease of native coronary artery without angina pectoris: Secondary | ICD-10-CM | POA: Insufficient documentation

## 2012-02-04 DIAGNOSIS — R0789 Other chest pain: Secondary | ICD-10-CM | POA: Diagnosis present

## 2012-02-04 DIAGNOSIS — K5900 Constipation, unspecified: Secondary | ICD-10-CM | POA: Insufficient documentation

## 2012-02-04 DIAGNOSIS — H269 Unspecified cataract: Secondary | ICD-10-CM | POA: Insufficient documentation

## 2012-02-04 DIAGNOSIS — E7889 Other lipoprotein metabolism disorders: Secondary | ICD-10-CM | POA: Insufficient documentation

## 2012-02-04 DIAGNOSIS — M069 Rheumatoid arthritis, unspecified: Secondary | ICD-10-CM | POA: Insufficient documentation

## 2012-02-04 DIAGNOSIS — R002 Palpitations: Secondary | ICD-10-CM | POA: Diagnosis present

## 2012-02-04 DIAGNOSIS — R001 Bradycardia, unspecified: Secondary | ICD-10-CM

## 2012-02-04 HISTORY — DX: Palpitations: R00.2

## 2012-02-04 HISTORY — DX: Dizziness and giddiness: R42

## 2012-02-04 HISTORY — DX: Dyspnea, unspecified: R06.00

## 2012-02-04 HISTORY — DX: Prediabetes: R73.03

## 2012-02-04 HISTORY — DX: Headache: R51

## 2012-02-04 HISTORY — DX: Other forms of dyspnea: R06.09

## 2012-02-04 HISTORY — DX: Personal history of other diseases of the respiratory system: Z87.09

## 2012-02-04 HISTORY — DX: Other fecal abnormalities: R19.5

## 2012-02-04 HISTORY — DX: Syncope and collapse: R55

## 2012-02-04 HISTORY — DX: Diverticulosis of intestine, part unspecified, without perforation or abscess without bleeding: K57.90

## 2012-02-04 HISTORY — DX: Residual hemorrhoidal skin tags: K64.4

## 2012-02-04 HISTORY — DX: Bradycardia, unspecified: R00.1

## 2012-02-04 HISTORY — DX: Malignant neoplasm of unspecified site of unspecified female breast: C50.919

## 2012-02-04 HISTORY — DX: Headache, unspecified: R51.9

## 2012-02-04 LAB — URINE MICROSCOPIC-ADD ON

## 2012-02-04 LAB — CBC WITH DIFFERENTIAL/PLATELET
Basophils Absolute: 0.1 10*3/uL (ref 0.0–0.1)
Basophils Relative: 1 % (ref 0–1)
MCHC: 32.5 g/dL (ref 30.0–36.0)
Monocytes Absolute: 0.6 10*3/uL (ref 0.1–1.0)
Neutro Abs: 4.6 10*3/uL (ref 1.7–7.7)
Neutrophils Relative %: 49 % (ref 43–77)
Platelets: 404 10*3/uL — ABNORMAL HIGH (ref 150–400)
RDW: 14.1 % (ref 11.5–15.5)

## 2012-02-04 LAB — BASIC METABOLIC PANEL
BUN: 15 mg/dL (ref 6–23)
Calcium: 9.3 mg/dL (ref 8.4–10.5)
Creatinine, Ser: 0.84 mg/dL (ref 0.50–1.10)
GFR calc Af Amer: 74 mL/min — ABNORMAL LOW (ref >90)
GFR calc non Af Amer: 64 mL/min — ABNORMAL LOW (ref >90)

## 2012-02-04 LAB — CBC
HCT: 34.8 % — ABNORMAL LOW (ref 36.0–46.0)
Hemoglobin: 11.5 g/dL — ABNORMAL LOW (ref 12.0–15.0)
MCH: 29.7 pg (ref 26.0–34.0)
MCHC: 33 g/dL (ref 30.0–36.0)
MCV: 89.9 fL (ref 78.0–100.0)
RDW: 14.1 % (ref 11.5–15.5)

## 2012-02-04 LAB — URINALYSIS, ROUTINE W REFLEX MICROSCOPIC
Bilirubin Urine: NEGATIVE
Nitrite: NEGATIVE
Specific Gravity, Urine: 1.012 (ref 1.005–1.030)
Urobilinogen, UA: 0.2 mg/dL (ref 0.0–1.0)

## 2012-02-04 MED ORDER — FOLIC ACID 1 MG PO TABS
1.0000 mg | ORAL_TABLET | Freq: Two times a day (BID) | ORAL | Status: DC
  Administered 2012-02-04 – 2012-02-05 (×2): 1 mg via ORAL
  Administered 2012-02-05: 1 mg
  Administered 2012-02-06 – 2012-02-08 (×5): 1 mg via ORAL
  Filled 2012-02-04 (×9): qty 1

## 2012-02-04 MED ORDER — ASPIRIN 81 MG PO CHEW
324.0000 mg | CHEWABLE_TABLET | ORAL | Status: AC
  Administered 2012-02-05: 324 mg via ORAL
  Filled 2012-02-04: qty 4

## 2012-02-04 MED ORDER — ACETAMINOPHEN 325 MG PO TABS
650.0000 mg | ORAL_TABLET | ORAL | Status: DC | PRN
  Administered 2012-02-04 – 2012-02-06 (×2): 650 mg via ORAL
  Filled 2012-02-04 (×2): qty 2

## 2012-02-04 MED ORDER — FAMOTIDINE 10 MG PO CHEW: 10.0000 mg | CHEWABLE_TABLET | Freq: Two times a day (BID) | ORAL | Status: DC

## 2012-02-04 MED ORDER — FAMOTIDINE 10 MG PO TABS
10.0000 mg | ORAL_TABLET | Freq: Two times a day (BID) | ORAL | Status: DC
  Administered 2012-02-05 – 2012-02-08 (×8): 10 mg via ORAL
  Filled 2012-02-04 (×9): qty 1

## 2012-02-04 MED ORDER — IOHEXOL 300 MG/ML  SOLN
100.0000 mL | Freq: Once | INTRAMUSCULAR | Status: AC | PRN
  Administered 2012-02-04: 100 mL via INTRAVENOUS

## 2012-02-04 MED ORDER — FUROSEMIDE 10 MG/ML IJ SOLN
20.0000 mg | Freq: Once | INTRAMUSCULAR | Status: AC
  Administered 2012-02-04: 20 mg via INTRAVENOUS
  Filled 2012-02-04: qty 2

## 2012-02-04 MED ORDER — METHOTREXATE 2.5 MG PO TABS
25.0000 mg | ORAL_TABLET | ORAL | Status: DC
  Administered 2012-02-04: 25 mg via ORAL
  Filled 2012-02-04: qty 10

## 2012-02-04 MED ORDER — DIAZEPAM 5 MG PO TABS
5.0000 mg | ORAL_TABLET | ORAL | Status: AC
  Administered 2012-02-05: 5 mg via ORAL
  Filled 2012-02-04: qty 1

## 2012-02-04 MED ORDER — SODIUM CHLORIDE 0.9 % IV SOLN: 250.0000 mL | INTRAVENOUS | Status: DC | PRN

## 2012-02-04 MED ORDER — HEPARIN SODIUM (PORCINE) 5000 UNIT/ML IJ SOLN
5000.0000 [IU] | Freq: Three times a day (TID) | INTRAMUSCULAR | Status: DC
  Administered 2012-02-04 – 2012-02-07 (×9): 5000 [IU] via SUBCUTANEOUS
  Filled 2012-02-04 (×11): qty 1

## 2012-02-04 MED ORDER — SODIUM CHLORIDE 0.9 % IV SOLN
INTRAVENOUS | Status: DC
  Administered 2012-02-04: 10:00:00 via INTRAVENOUS

## 2012-02-04 MED ORDER — PREDNISONE 1 MG PO TABS
3.0000 mg | ORAL_TABLET | Freq: Every day | ORAL | Status: DC
  Administered 2012-02-04: 3 mg via ORAL
  Administered 2012-02-05: 3 mg
  Administered 2012-02-06 – 2012-02-08 (×3): 3 mg via ORAL
  Filled 2012-02-04 (×5): qty 3

## 2012-02-04 MED ORDER — SODIUM CHLORIDE 0.9 % IJ SOLN: 3.0000 mL | INTRAMUSCULAR | Status: DC | PRN

## 2012-02-04 MED ORDER — SODIUM CHLORIDE 0.9 % IJ SOLN
3.0000 mL | Freq: Two times a day (BID) | INTRAMUSCULAR | Status: DC
  Administered 2012-02-04 – 2012-02-08 (×4): 3 mL via INTRAVENOUS

## 2012-02-04 MED ORDER — NITROGLYCERIN 0.4 MG SL SUBL: 0.4000 mg | SUBLINGUAL_TABLET | SUBLINGUAL | Status: DC | PRN

## 2012-02-04 MED ORDER — SODIUM CHLORIDE 0.9 % IV SOLN
INTRAVENOUS | Status: DC
  Administered 2012-02-07: 50 mL via INTRAVENOUS
  Administered 2012-02-07: 02:00:00 via INTRAVENOUS

## 2012-02-04 MED ORDER — ATORVASTATIN CALCIUM 10 MG PO TABS
10.0000 mg | ORAL_TABLET | Freq: Every day | ORAL | Status: DC
  Administered 2012-02-04 – 2012-02-08 (×4): 10 mg via ORAL
  Filled 2012-02-04 (×5): qty 1

## 2012-02-04 MED ORDER — ASPIRIN 81 MG PO CHEW
324.0000 mg | CHEWABLE_TABLET | Freq: Once | ORAL | Status: AC
  Administered 2012-02-04: 324 mg via ORAL
  Filled 2012-02-04: qty 4

## 2012-02-04 MED ORDER — ONDANSETRON HCL 4 MG/2ML IJ SOLN: 4.0000 mg | Freq: Four times a day (QID) | INTRAMUSCULAR | Status: DC | PRN

## 2012-02-04 NOTE — ED Notes (Signed)
Patient asleep with family at bedside.

## 2012-02-04 NOTE — ED Notes (Signed)
Patient taken to restroom in wheelchair by Jill Alexanders, EMT.

## 2012-02-04 NOTE — ED Provider Notes (Signed)
History     CSN: [id number]  Arrival date & time 02/04/12  0904   First MD Initiated Contact with Patient 02/04/12 [phone]      Chief Complaint  Patient presents with  . Chest Pain  . Loss of Consciousness     HPI Pt was seen at 0930.   Per pt, c/o sudden onset and resolution of one episode of brief syncope that occurred PTA.  States she was walking into another room when she "just passed out."  States she woke up with left sided "heavy" and "squeezing" chest pain this morning.  Endorses she has been experiencing this same chest discomfort intermittently over the past several days. Worsens with exertion.  States she has been taking her "GERD medicine" thinking that would help the pain but it does not.  Denies palpitations, no SOB/cough, no abd pain, no N/V/D, no focal motor weakness, no tingling/numbness in extremities, no facial droop, no slurred speech, no seizure activity, no incont/retention of bowel or bladder.    Cards: Dr. Lovina Reach Past Medical History  Diagnosis Date  . Hypertension   . Cancer 1996    BREAST CANCER -LEFT BREAST REMOVED -  1996  . Rheumatoid arthritis     PT IS FOLLOWED BY DR Miachel Roux [facility]  . Hyperlipidemia   . Cataract     Bil  . GERD (gastroesophageal reflux disease)   . H/O hiatal hernia     Past Surgical History  Procedure Date  . Mastectomy     Left  . Cystectomy     Left shoulder  . Vaginal hysterectomy   . Tonsillectomy   . Colonoscopy   . Polypectomy     Family History  Problem Relation Age of Onset  . Cancer Mother     LEFT BREAST REMOVED  . Heart attack Mother   . Heart disease Mother   . Breast cancer Mother     History  Substance Use Topics  . Smoking status: Current Every Day Smoker -- 0.2 packs/day    Types: Cigarettes  . Smokeless tobacco: Never Used  . Alcohol Use: No     Comment: occasional    Review of Systems ROS: Statement: All systems negative except as marked or noted in the HPI; Constitutional:  Negative for fever and chills. ; ; Eyes: Negative for eye pain, redness and discharge. ; ; ENMT: Negative for ear pain, hoarseness, nasal congestion, sinus pressure and sore throat. ; ; Cardiovascular: +CP. Negative for palpitations, diaphoresis, dyspnea and peripheral edema. ; ; Respiratory: Negative for cough, wheezing and stridor. ; ; Gastrointestinal: Negative for nausea, vomiting, diarrhea, abdominal pain, blood in stool, hematemesis, jaundice and rectal bleeding. . ; ; Genitourinary: Negative for dysuria, flank pain and hematuria. ; ; Musculoskeletal: Negative for back pain and neck pain. Negative for swelling and trauma.; ; Skin: Negative for pruritus, rash, abrasions, blisters, bruising and skin lesion.; ; Neuro: Negative for headache, lightheadedness and neck stiffness. Negative for weakness, altered level of consciousness , altered mental status, extremity weakness, paresthesias, involuntary movement, seizure and +syncope.       Allergies  Penicillins  Home Medications   Current Outpatient Rx  Name  Route  Sig  Dispense  Refill  . CITRACAL PO   Oral   Take by mouth daily.         Marland Kitchen FAMOTIDINE 10 MG PO CHEW   Oral   Chew 10 mg by mouth 2 (two) times daily.         Marland Kitchen  FOLIC ACID 1 MG PO TABS      2 TABS DAILY          . CENTRUM SILVER PO   Oral   Take by mouth. 1 TAB DAILY         . OVER THE COUNTER MEDICATION      CALICUM WITH VITAMIN D 1 TAB DAILY          . PREDNISONE 5 MG PO TABS   Oral   Take 1 mg by mouth. Take 3 pills daily         . ROSUVASTATIN CALCIUM 5 MG PO TABS   Oral   Take 1 tablet (5 mg total) by mouth at bedtime.   30 tablet   11   . TRIAMTERENE-HCTZ 37.5-25 MG PO TABS   Oral   Take 1 tablet by mouth daily.           Marland Kitchen METHOTREXATE 2.5 MG PO TABS   Oral   Take 2.5 mg by mouth once a week. Caution:Chemotherapy. Protect from light. ( Pt takes 10 TABS ON SUN)            BP 157/67  Pulse 51  Resp 20  SpO2 99%  Physical  Exam 0935: Physical examination:  Nursing notes reviewed; Vital signs and O2 SAT reviewed;  Constitutional: Well developed, Well nourished, Well hydrated, In no acute distress; Head:  Normocephalic, atraumatic; Eyes: EOMI, PERRL, No scleral icterus; ENMT: Mouth and pharynx normal, Mucous membranes moist; Neck: Supple, Full range of motion, No lymphadenopathy; Cardiovascular: Bradycardic rate and rhythm, No gallop; Respiratory: Breath sounds clear & equal bilaterally, No wheezes.  Speaking full sentences with ease, Normal respiratory effort/excursion; Chest: Nontender, No rash. Movement normal; Abdomen: Soft, Nontender, Nondistended, Normal bowel sounds;; Extremities: Pulses normal, No tenderness, No edema, No calf edema or asymmetry.; Neuro: AA&Ox3, Major CN grossly intact.  Speech clear. No facial droop. No gross focal motor or sensory deficits in extremities.; Skin: Color normal, Warm, Dry.   ED Course  Procedures   0945:  T/C from [facility] Dr. Tenny Craw, case discussed: requests to please admit to their service when workup completed.   1130:  Not orthostatic.  CP improving with ASA.   Continues bradycardic on monitor, rate 40-50's.  Dx and testing d/w pt and family.  Questions answered.  Verb understanding, agreeable to admit.  T/C to [facility] Dr. Graciela Husbands, case discussed, including:  HPI, pertinent PM/SHx, VS/PE, dx testing, ED course and treatment:  Agreeable to come to ED for eval to admit.   1250:  Cards Dr. Graciela Husbands has eval, requesst to obtain CT A/P as pt's abd is sore on his exam.  Pt re-eval:  A&O, laughing and talking with family, resps easy, VSS, CTA, abd soft/+TTP RLQ and LLQ.  Pt states "it's just sore there now where he pressed me."  Denies upper abd pain, denies back pain, no CP/SOB.  Will obtain CT A/P now to further clarify abd pain.  Family and pt aware and agreeable.  EPIC chart reviewed: no hx of AAA on previous CT scans; reassuring.    MDM  MDM Reviewed: nursing note, previous  chart and vitals Reviewed previous: ECG and labs Interpretation: ECG, labs and x-ray    Date: 02/04/2012  Rate: 50  Rhythm: sinus bradycardia  QRS Axis: normal  Intervals: PR prolonged  ST/T Wave abnormalities: nonspecific T wave changes  Conduction Disutrbances:first-degree A-V block   Narrative Interpretation:   Old EKG Reviewed: unchanged; rate slower, otherwise  no significant changes compared to previous EKG dated 03/26/2008.  Results for orders placed during the hospital encounter of 02/04/12  BASIC METABOLIC PANEL      Component Value Range   Sodium 141  135 - 145 mEq/L   Potassium 3.9  3.5 - 5.1 mEq/L   Chloride 106  96 - 112 mEq/L   CO2 24  19 - 32 mEq/L   Glucose, Bld 94  70 - 99 mg/dL   BUN 15  6 - 23 mg/dL   Creatinine, Ser 1.91  0.50 - 1.10 mg/dL   Calcium 9.3  8.4 - 47.8 mg/dL   GFR calc non Af Amer 64 (*) >90 mL/min   GFR calc Af Amer 74 (*) >90 mL/min  CBC WITH DIFFERENTIAL      Component Value Range   WBC 9.4  4.0 - 10.5 K/uL   RBC 4.00  3.87 - 5.11 MIL/uL   Hemoglobin 11.7 (*) 12.0 - 15.0 g/dL   HCT 29.5  62.1 - 30.8 %   MCV 90.0  78.0 - 100.0 fL   MCH 29.3  26.0 - 34.0 pg   MCHC 32.5  30.0 - 36.0 g/dL   RDW 65.7  84.6 - 96.2 %   Platelets 404 (*) 150 - 400 K/uL   Neutrophils Relative 49  43 - 77 %   Neutro Abs 4.6  1.7 - 7.7 K/uL   Lymphocytes Relative 39  12 - 46 %   Lymphs Abs 3.7  0.7 - 4.0 K/uL   Monocytes Relative 6  3 - 12 %   Monocytes Absolute 0.6  0.1 - 1.0 K/uL   Eosinophils Relative 5  0 - 5 %   Eosinophils Absolute 0.5  0.0 - 0.7 K/uL   Basophils Relative 1  0 - 1 %   Basophils Absolute 0.1  0.0 - 0.1 K/uL  TROPONIN I      Component Value Range   Troponin I <0.30  <0.30 ng/mL   Dg Chest 2 View 02/04/2012  *RADIOLOGY REPORT*  Clinical Data: Chest pain.  Syncope.  CHEST - 2 VIEW  Comparison: Chest x-ray 06/04/2011.  Findings: Lung volumes are normal.  No consolidative airspace disease.  No pleural effusions.  Mild pulmonary venous  congestion without frank pulmonary edema.  Mild cardiomegaly is unchanged. The patient is rotated to the right on today's exam, resulting in distortion of the mediastinal contours and reduced diagnostic sensitivity and specificity for mediastinal pathology. Atherosclerosis in the thoracic aorta.  IMPRESSION: 1.  Mild cardiomegaly with pulmonary venous congestion, but no frank pulmonary edema. 2.  Atherosclerosis.   Original Report Authenticated By: Trudie Reed, M.D.    Ct Head Wo Contrast 02/04/2012  *RADIOLOGY REPORT*  Clinical Data: Chest pain.  Loss of consciousness.  CT HEAD WITHOUT CONTRAST  Technique:  Contiguous axial images were obtained from the base of the skull through the vertex without contrast.  Comparison: No priors.  Findings: No acute intracranial abnormalities.  Specifically, no definite areas to suggest acute/subacute cerebral ischemia, no evidence of acute intracerebral hemorrhage, no mass, mass effect, hydrocephalus or abnormal intra or extra-axial fluid collections. Faint physiologic calcifications are noted in the basal ganglia bilaterally (right greater than left).  The visualized paranasal sinuses and mastoids are well pneumatized.  No acute displaced skull fractures are noted.  IMPRESSION: 1.  No acute intracranial abnormalities. 2.  The appearance of the brain is normal for age.   Original Report Authenticated By: Trudie Reed, M.D.  Laray Anger, DO 02/05/12 1234

## 2012-02-04 NOTE — ED Notes (Signed)
Received pt from home with c/o per family pt reported that she woke up with left sided chest pain, but did not tell her family. Pt went to bathroom and family heard a "Thud." Pt last seen normal last night.

## 2012-02-04 NOTE — H&P (Signed)
History and Physical  Patient ID: Carol Bright MRN: [id number], SOB: 1930-07-24 79 y.o. Date of Encounter: 02/04/2012, 1:07 PM  Primary Physician: Garlan Fillers, MD Primary Cardiologist: PR Primary Electrophysiologist:  n/a  Chief Complaint:  Chest pain and syncope  History of Present Illness: Carol Bright is a 79 y.o. female has history of RA and CP for which she underwent Myoview 1 yr ago with normal LV function and no ischemia, but also significant and worseing DOE unassoc with edema but with Orthopnea/PND and with history of recently worsening Orthostatic intolerance despite reductions in antihypertensives  This am she got up in am, sat on bed as usual and then went to [facility] but collapsed on floor.  Duration unknown; eyes rolled back in head per granddaughter.  BP measured as elevated and HR slow, but review of trends data over last year all HR<60 and many<50.  She has started sitting on the side of teh bed because of lightheadedness with standing.  She has prog DOE often asoc with CP and also frequently associated with tachycardia palpitations. She has no known history of tachycardia.  She denies recent fevers or chills. She has however had a recent  exacerbation of her rheumatoid arthritis requiring pulse dose of  Steroids.     Past Medical History  Diagnosis Date  . Hypertension   . Cancer Breast 1996    BREAST CANCER -LEFT BREAST REMOVED -  1996  . Rheumatoid arthritis     PT IS FOLLOWED BY DR Miachel Roux [facility]  . Hyperlipidemia   . Cataract     Bil  . GERD (gastroesophageal reflux disease)   . H/O hiatal hernia   . Dyspnea on exertion   . Diverticulosis   . Syncope   . Orthostatic lightheadedness   . Palpitations      Past Surgical History  Procedure Date  . Mastectomy     Left  . Cystectomy     Left shoulder  . Vaginal hysterectomy   . Tonsillectomy   . Colonoscopy   . Polypectomy       Current Facility-Administered Medications    Medication Dose Route Frequency Provider Last Rate Last Dose  . 0.9 %  sodium chloride infusion   Intravenous Continuous Laray Anger, DO 75 mL/hr at 02/04/12 0955    . nitroGLYCERIN (NITROSTAT) SL tablet 0.4 mg  0.4 mg Sublingual Q5 min PRN Laray Anger, DO       Current Outpatient Prescriptions  Medication Sig Dispense Refill  . Calcium Citrate (CITRACAL PO) Take by mouth daily.      . famotidine (PEPCID AC) 10 MG chewable tablet Chew 10 mg by mouth 2 (two) times daily.      . folic acid (FOLVITE) 1 MG tablet 2 TABS DAILY       . Multiple Vitamins-Minerals (CENTRUM SILVER PO) Take by mouth. 1 TAB DAILY      . OVER THE COUNTER MEDICATION CALICUM WITH VITAMIN D 1 TAB DAILY       . predniSONE (DELTASONE) 5 MG tablet Take 1 mg by mouth. Take 3 pills daily      . rosuvastatin (CRESTOR) 5 MG tablet Take 1 tablet (5 mg total) by mouth at bedtime.  30 tablet  11  . triamterene-hydrochlorothiazide (MAXZIDE-25) 37.5-25 MG per tablet Take 1 tablet by mouth daily.        . methotrexate (RHEUMATREX) 2.5 MG tablet Take 2.5 mg by mouth once a week. Caution:Chemotherapy. Protect from  light. ( Pt takes 10 TABS ON SUN)       . [DISCONTINUED] simvastatin (ZOCOR) 20 MG tablet Take 1 tablet (20 mg total) by mouth every evening.  30 tablet  6     Allergies: Allergies  Allergen Reactions  . Penicillins      History  Substance Use Topics  . Smoking status: Current Every Day Smoker -- 0.2 packs/day    Types: Cigarettes  . Smokeless tobacco: Never Used  . Alcohol Use: No     Comment: occasional      Family History  Problem Relation Age of Onset  . Cancer Mother     LEFT BREAST REMOVED  . Heart attack Mother   . Heart disease Mother   . Breast cancer Mother       ROS:  Please see the history of present illness.   Negative except issues with a mean relating to losing teeth  All other systems reviewed and negative.   Vital Signs: Blood pressure 154/78, pulse 52, resp. rate 15,  SpO2 100.00%.  PHYSICAL EXAM: General:  Well nourished, well developed female in no acute distress  HEENT: normal Lymph: no adenopathy Neck: no JVD, carotids without bruits,  CSM -neg Endocrine:  No thryomegaly Vascular: FA pulses 2+ bilaterally without bruits Cardiac:  normal S1, S2; positive S4 and an early systolic murmur Back: without kyphosis/scoliosis, no CVA tenderness Lungs:  clear to auscultation bilaterally, no wheezing, rhonchi or rales Abd: soft, no hepatomegaly bowel sounds present but moderate diffuse tenderness, invoking tears Ext: Trace edema Musculoskeletal:  No deformities, BUE and BLE strength normal and equal Skin: warm and dry Neuro:  CNs 2-12 intact, no focal abnormalities noted Psych:  Normal affect   EKG:  SINUS BRady 50  21/08/43 prwp o/w normal Labs:   Lab Results  Component Value Date   WBC 9.4 02/04/2012   HGB 11.7* 02/04/2012   HCT 36.0 02/04/2012   MCV 90.0 02/04/2012   PLT 404* 02/04/2012     Lab 02/04/12 0940  NA 141  K 3.9  CL 106  CO2 24  BUN 15  CREATININE 0.84  CALCIUM 9.3  PROT --  BILITOT --  ALKPHOS --  ALT --  AST --  GLUCOSE 94    Basename 02/04/12 0940  CKTOTAL --  CKMB --  TROPONINI <0.30   Lab Results  Component Value Date   CHOL 188 07/12/2011   HDL 65.90 07/12/2011   LDLCALC 106* 07/12/2011   TRIG 82.0 07/12/2011   No results found for this basename: DDIMER   BNP No results found for this basename: probnp          ASSESSMENT AND PLAN:   Patient Active Hospital Problem List: Syncope ()   Chest pain (12/27/2010)   Dyspnea on exertion ()   Orthostatic lightheadedness ()   Palpitations ()  Sinus bradycardia   The patient presents with syncope upon standing and going towards the bathroom. She has a history of a.m. orthostatic intolerance and so it is likely that the mechanism for the same. Her sinus bradycardia represents another possibility, although, her heart rates have been slow for most of the year  when documented. Assessment of left ventricular function will be important although I suspect it will be normal and if so would probably not workup for syncope any further in terms of structural causes.  The bradycardia and the palpitations with exertion raises the possibility of tachybradycardia syndrome. I also wonder whether she is chronotropic incompetence contributing  to her dyspnea on exertion and Holter monitoring or low level stress testing I think will be helpful to try to elucidate chronotropic function. I broached briefly the potential value of pacing in the event that chronotropic incompetence were identified.  The patient continues to suffer from progressive exercise intolerance accompanied by chest pain. She has multiple cardiac risk factors include hypertension dyslipidemia and family history. She has had a negative Myoview scan, but with her dyspnea, I would favor right and left heart catheterization to put the issue of ischemia to rest as well as to try to elucidate potential contributors to her dyspnea. I suspect that she has some degree/much degree of diastolic dysfunction.  Event recording will be important to try to clarify the mechanism of the palpitations that she associates with her exertion. This may also provide an adequate assessment of heart rate excursion and would undertake this prior to submitted her for treadmill testing of which she has a fair amount of anxiety.  Her blood pressure has been treated in the past and she has recently discontinued medications on her own because of lightheadedness. Apparently she was on a beta blocker although I don't see it in any of her medication lists. Her daughter is a CNA so I am not sure which one is correct.    Plan 1-admit for cycle enzymes 2-right and left heart catheterization 3-raise head of bed 4-anticipate outpatient event recorder 5-emergency room to evaluate abdominal pain 6- urine cultures pending 7-TSH

## 2012-02-04 NOTE — ED Notes (Signed)
Patient transported to X-ray and CT 

## 2012-02-05 ENCOUNTER — Encounter (HOSPITAL_COMMUNITY)
Admission: EM | Disposition: A | Discharge status: Home / Self Care | Payer: Self-pay | Source: Home / Self Care | Attending: Emergency Medicine

## 2012-02-05 DIAGNOSIS — I2510 Atherosclerotic heart disease of native coronary artery without angina pectoris: Secondary | ICD-10-CM

## 2012-02-05 DIAGNOSIS — I319 Disease of pericardium, unspecified: Secondary | ICD-10-CM

## 2012-02-05 HISTORY — PX: CARDIAC CATHETERIZATION: SHX172

## 2012-02-05 HISTORY — PX: LEFT AND RIGHT HEART CATHETERIZATION WITH CORONARY ANGIOGRAM: SHX5449

## 2012-02-05 LAB — POCT I-STAT 3, VENOUS BLOOD GAS (G3P V)
O2 Saturation: 64 %
TCO2: 27 mmol/L (ref 0–100)
pCO2, Ven: 42.1 mmHg — ABNORMAL LOW (ref 45.0–50.0)

## 2012-02-05 LAB — POCT I-STAT 3, ART BLOOD GAS (G3+)
Acid-base deficit: 1 mmol/L (ref 0.0–2.0)
Bicarbonate: 23.9 mEq/L (ref 20.0–24.0)
pH, Arterial: 7.408 (ref 7.350–7.450)

## 2012-02-05 LAB — BASIC METABOLIC PANEL
BUN: 11 mg/dL (ref 6–23)
BUN: 11 mg/dL (ref 6–23)
CO2: 22 mEq/L (ref 19–32)
Chloride: 103 mEq/L (ref 96–112)
Chloride: 103 mEq/L (ref 96–112)
Creatinine, Ser: 0.79 mg/dL (ref 0.50–1.10)
Creatinine, Ser: 0.79 mg/dL (ref 0.50–1.10)
Glucose, Bld: 168 mg/dL — ABNORMAL HIGH (ref 70–99)
Potassium: 3.4 mEq/L — ABNORMAL LOW (ref 3.5–5.1)

## 2012-02-05 LAB — PROTIME-INR: Prothrombin Time: 13.9 seconds (ref 11.6–15.2)

## 2012-02-05 LAB — URINE CULTURE

## 2012-02-05 LAB — TROPONIN I: Troponin I: <0.3 ng/mL (ref <0.30)

## 2012-02-05 SURGERY — LEFT AND RIGHT HEART CATHETERIZATION WITH CORONARY ANGIOGRAM
Anesthesia: LOCAL

## 2012-02-05 MED ORDER — NITROGLYCERIN 0.2 MG/ML ON CALL CATH LAB
INTRAVENOUS | Status: AC
  Filled 2012-02-05: qty 1

## 2012-02-05 MED ORDER — LIDOCAINE HCL (PF) 1 % IJ SOLN
INTRAMUSCULAR | Status: AC
  Filled 2012-02-05: qty 30

## 2012-02-05 MED ORDER — SODIUM CHLORIDE 0.9 % IJ SOLN: 3.0000 mL | INTRAMUSCULAR | Status: DC | PRN

## 2012-02-05 MED ORDER — SODIUM CHLORIDE 0.9 % IV SOLN: 1.0000 mL/kg/h | INTRAVENOUS | Status: AC

## 2012-02-05 MED ORDER — SODIUM CHLORIDE 0.9 % IJ SOLN: 3.0000 mL | Freq: Two times a day (BID) | INTRAMUSCULAR | Status: DC

## 2012-02-05 MED ORDER — FENTANYL CITRATE 0.05 MG/ML IJ SOLN
INTRAMUSCULAR | Status: AC
  Filled 2012-02-05: qty 2

## 2012-02-05 MED ORDER — ACETAMINOPHEN 325 MG PO TABS: 650.0000 mg | ORAL_TABLET | ORAL | Status: DC | PRN

## 2012-02-05 MED ORDER — SODIUM CHLORIDE 0.9 % IV SOLN: 250.0000 mL | INTRAVENOUS | Status: DC | PRN

## 2012-02-05 MED ORDER — MIDAZOLAM HCL 2 MG/2ML IJ SOLN
INTRAMUSCULAR | Status: AC
Start: 2012-02-05 — End: 2012-02-05
  Filled 2012-02-05: qty 2

## 2012-02-05 MED ORDER — HEPARIN (PORCINE) IN NACL 2-0.9 UNIT/ML-% IJ SOLN
INTRAMUSCULAR | Status: AC
  Filled 2012-02-05: qty 1500

## 2012-02-05 MED ORDER — ONDANSETRON HCL 4 MG/2ML IJ SOLN: 4.0000 mg | Freq: Four times a day (QID) | INTRAMUSCULAR | Status: DC | PRN

## 2012-02-05 NOTE — Progress Notes (Addendum)
Subjective: Patient denies CP  Is dizzy when she gets up Objective: Filed Vitals:   02/04/12 1425 02/04/12 1500 02/04/12 2100 02/05/12 0500  BP: 143/78 147/64 95/53 110/52  Pulse: 62 67 68 64  Temp:  98.4 F (36.9 C) 98.2 F (36.8 C) 98.5 F (36.9 C)  TempSrc:  Oral    Resp: 20 18 16 16   Height:  4\' 11"  (1.499 m)    Weight:  142 lb 6.7 oz (64.6 kg)  132 lb 14.4 oz (60.283 kg)  SpO2: 100% 98% 95% 96%   Weight change:   Intake/Output Summary (Last 24 hours) at 02/05/12 0802 Last data filed at 02/04/12 2350  Gross per 24 hour  Intake 1001.25 ml  Output    900 ml  Net 101.25 ml    General: Alert, awake, oriented x3, in no acute distress Neck:  JVP is normal Heart: Regular rate and rhythm, without murmurs, rubs, gallops.  Lungs: Clear to auscultation.  No rales or wheezes. Abdomen.  Tender on palpation.  No masses  INcreased bowel sounds Exemities:  No edema.   Neuro: Grossly intact, nonfocal.  Tele:  SR Lab Results: Results for orders placed during the hospital encounter of 02/04/12 (from the past 24 hour(s))  BASIC METABOLIC PANEL     Status: Abnormal   Collection Time   02/04/12  9:40 AM      Component Value Range   Sodium 141  135 - 145 mEq/L   Potassium 3.9  3.5 - 5.1 mEq/L   Chloride 106  96 - 112 mEq/L   CO2 24  19 - 32 mEq/L   Glucose, Bld 94  70 - 99 mg/dL   BUN 15  6 - 23 mg/dL   Creatinine, Ser 4.09  0.50 - 1.10 mg/dL   Calcium 9.3  8.4 - 81.1 mg/dL   GFR calc non Af Amer 64 (*) >90 mL/min   GFR calc Af Amer 74 (*) >90 mL/min  CBC WITH DIFFERENTIAL     Status: Abnormal   Collection Time   02/04/12  9:40 AM      Component Value Range   WBC 9.4  4.0 - 10.5 K/uL   RBC 4.00  3.87 - 5.11 MIL/uL   Hemoglobin 11.7 (*) 12.0 - 15.0 g/dL   HCT 91.4  78.2 - 95.6 %   MCV 90.0  78.0 - 100.0 fL   MCH 29.3  26.0 - 34.0 pg   MCHC 32.5  30.0 - 36.0 g/dL   RDW 21.3  08.6 - 57.8 %   Platelets 404 (*) 150 - 400 K/uL   Neutrophils Relative 49  43 - 77 %   Neutro Abs  4.6  1.7 - 7.7 K/uL   Lymphocytes Relative 39  12 - 46 %   Lymphs Abs 3.7  0.7 - 4.0 K/uL   Monocytes Relative 6  3 - 12 %   Monocytes Absolute 0.6  0.1 - 1.0 K/uL   Eosinophils Relative 5  0 - 5 %   Eosinophils Absolute 0.5  0.0 - 0.7 K/uL   Basophils Relative 1  0 - 1 %   Basophils Absolute 0.1  0.0 - 0.1 K/uL  TROPONIN I     Status: Normal   Collection Time   02/04/12  9:40 AM      Component Value Range   Troponin I <0.30  <0.30 ng/mL  URINALYSIS, ROUTINE W REFLEX MICROSCOPIC     Status: Abnormal   Collection Time   02/04/12  12:21 PM      Component Value Range   Color, Urine YELLOW  YELLOW   APPearance CLOUDY (*) CLEAR   Specific Gravity, Urine 1.012  1.005 - 1.030   pH 6.5  5.0 - 8.0   Glucose, UA NEGATIVE  NEGATIVE mg/dL   Hgb urine dipstick NEGATIVE  NEGATIVE   Bilirubin Urine NEGATIVE  NEGATIVE   Ketones, ur NEGATIVE  NEGATIVE mg/dL   Protein, ur NEGATIVE  NEGATIVE mg/dL   Urobilinogen, UA 0.2  0.0 - 1.0 mg/dL   Nitrite NEGATIVE  NEGATIVE   Leukocytes, UA SMALL (*) NEGATIVE  URINE MICROSCOPIC-ADD ON     Status: Abnormal   Collection Time   02/04/12 12:21 PM      Component Value Range   Squamous Epithelial / LPF FEW (*) RARE   WBC, UA 3-6  <3 WBC/hpf   Bacteria, UA RARE  RARE  TROPONIN I     Status: Normal   Collection Time   02/04/12  5:46 PM      Component Value Range   Troponin I <0.30  <0.30 ng/mL  TSH     Status: Normal   Collection Time   02/04/12  5:47 PM      Component Value Range   TSH 0.557  0.350 - 4.500 uIU/mL  CBC     Status: Abnormal   Collection Time   02/04/12  5:47 PM      Component Value Range   WBC 9.2  4.0 - 10.5 K/uL   RBC 3.87  3.87 - 5.11 MIL/uL   Hemoglobin 11.5 (*) 12.0 - 15.0 g/dL   HCT 78.2 (*) 95.6 - 21.3 %   MCV 89.9  78.0 - 100.0 fL   MCH 29.7  26.0 - 34.0 pg   MCHC 33.0  30.0 - 36.0 g/dL   RDW 08.6  57.8 - 46.9 %   Platelets 353  150 - 400 K/uL  CREATININE, SERUM     Status: Abnormal   Collection Time   02/04/12  5:47 PM       Component Value Range   Creatinine, Ser 0.82  0.50 - 1.10 mg/dL   GFR calc non Af Amer 66 (*) >90 mL/min   GFR calc Af Amer 76 (*) >90 mL/min  TROPONIN I     Status: Normal   Collection Time   02/05/12  1:36 AM      Component Value Range   Troponin I <0.30  <0.30 ng/mL    Studies/Results: @[id number] @  Medications:  Reviewed   Patient Active Hospital Problem List: Syncope     Assessment: Patient has noticed increased dizziness with moving around over the past 6 wks  Has cut back on her activities for fear of falling Yesterday was the first syncopal spell she has ever had. ON review of orthostatics from yesterday It looks like BP went up with standing but so did heart rate (46 to 66 bpm)  Will need to repeat to confirm after cath. Patient does say that she gets adequate fluids during the day. Continue telemetry.  Chest pain   CP with DOE  Patient says it has been getting worse. Of note patient had calcification of abdominal aorta on CT yesterday. Patient for R and L heart cath today.  Palpitations  No arrhythmia noted.  Sinus bradycardia (02/04/2012)   Assessment:Continue telemetry. Will be important to have patient ambulate before d/c to track HR response.  May need event monitor as outpatient   Abdominal tenderness.  On exam patient is tender   Appears to have a lot of gas Had colonoscopy in November that showed diverticula  Told to eat more fiber CT yesterday was without signif abnormality.  HL  WIl check fasting lipids.  Needs aggressive control Dietrich Pates 02/05/2012, 8:02 AM

## 2012-02-05 NOTE — Progress Notes (Signed)
  Echocardiogram 2D Echocardiogram has been performed.  Georgian Co 02/05/2012, 12:53 PM

## 2012-02-05 NOTE — Progress Notes (Signed)
Utilization review completed.  

## 2012-02-05 NOTE — Interval H&P Note (Signed)
History and Physical Interval Note:  02/05/2012 9:09 AM  Carol Bright  has presented today for surgery, with the diagnosis of cp  The various methods of treatment have been discussed with the patient and family. After consideration of risks, benefits and other options for treatment, the patient has consented to  Procedure(s) (LRB) with comments: LEFT AND RIGHT HEART CATHETERIZATION WITH CORONARY ANGIOGRAM (N/A) as a surgical intervention .  The patient's history has been reviewed, patient examined, no change in status, stable for surgery.  I have reviewed the patient's chart and labs.  Questions were answered to the patient's satisfaction.     Tonny Bollman

## 2012-02-05 NOTE — CV Procedure (Signed)
   Cardiac Catheterization Procedure Note  Name: Carol Bright MRN: [id number] DOB: 16-Sep-1930  Procedure: Right Heart Cath, Left Heart Cath, Selective Coronary Angiography, LV angiography  Indication: Chest pain, syncope, shortness of breath   Procedural Details: The right groin was prepped, draped, and anesthetized with 1% lidocaine. Using the modified Seldinger technique a 5 French sheath was placed in the right femoral artery and a 6 French sheath was placed in the right femoral vein. A multipurpose catheter was used for the right heart catheterization. Standard protocol was followed for recording of right heart pressures and sampling of oxygen saturations. Fick cardiac output was calculated. Standard Judkins catheters were used for selective coronary angiography and left ventriculography. There were no immediate procedural complications. The patient was transferred to the post catheterization recovery area for further monitoring.  Procedural Findings: Hemodynamics RA 2 RV 21/3 PA 18/5 mean 10 PCWP 4 LV 133/6 AO 127/60  Oxygen saturations: PA 64 AO 92  Cardiac Output (Fick) 4.9  Cardiac Index (Fick) 3.0   Coronary angiography: Coronary dominance: right  Left mainstem: Widely patent, no obstructive disease  Left anterior descending (LAD): 20-30% mid-vessel stenosis. Moderate calcification. No high-grade stenosis. The diagonal branches are small in caliber without disease.  Left circumflex (LCx): Widely patent. 2 large OM branches without significant stenoses.  Right coronary artery (RCA): Dominant vessel. Diffuse 30-40% stenosis in the mid-vessel. PDA branch widely patent.   Left ventriculography: Left ventricular systolic function is normal, LVEF is estimated at 55-65%, there is no significant mitral regurgitation   Final Conclusions:   1. Minor nonobstructive CAD 2. Very low intracardiac filling pressures  Recommendations: Push fluids, avoid antihypertensive  medications/diuretics if possible. Recommend observe another 24 hours and ambulate to make sure no further symptoms of orthostasis/syncope.  Tonny Bollman 02/05/2012, 9:37 AM

## 2012-02-05 NOTE — H&P (View-Only) (Signed)
Subjective: Patient denies CP  Is dizzy when she gets up Objective: Filed Vitals:   02/04/12 1425 02/04/12 1500 02/04/12 2100 02/05/12 0500  BP: 143/78 147/64 95/53 110/52  Pulse: 62 67 68 64  Temp:  98.4 F (36.9 C) 98.2 F (36.8 C) 98.5 F (36.9 C)  TempSrc:  Oral    Resp: 20 18 16 16  Height:  4' 11" (1.499 m)    Weight:  142 lb 6.7 oz (64.6 kg)  132 lb 14.4 oz (60.283 kg)  SpO2: 100% 98% 95% 96%   Weight change:   Intake/Output Summary (Last 24 hours) at 02/05/12 0802 Last data filed at 02/04/12 2350  Gross per 24 hour  Intake 1001.25 ml  Output    900 ml  Net 101.25 ml    General: Alert, awake, oriented x3, in no acute distress Neck:  JVP is normal Heart: Regular rate and rhythm, without murmurs, rubs, gallops.  Lungs: Clear to auscultation.  No rales or wheezes. Abdomen.  Tender on palpation.  No masses  INcreased bowel sounds Exemities:  No edema.   Neuro: Grossly intact, nonfocal.  Tele:  SR Lab Results: Results for orders placed during the hospital encounter of 02/04/12 (from the past 24 hour(s))  BASIC METABOLIC PANEL     Status: Abnormal   Collection Time   02/04/12  9:40 AM      Component Value Range   Sodium 141  135 - 145 mEq/L   Potassium 3.9  3.5 - 5.1 mEq/L   Chloride 106  96 - 112 mEq/L   CO2 24  19 - 32 mEq/L   Glucose, Bld 94  70 - 99 mg/dL   BUN 15  6 - 23 mg/dL   Creatinine, Ser 0.84  0.50 - 1.10 mg/dL   Calcium 9.3  8.4 - 10.5 mg/dL   GFR calc non Af Amer 64 (*) >90 mL/min   GFR calc Af Amer 74 (*) >90 mL/min  CBC WITH DIFFERENTIAL     Status: Abnormal   Collection Time   02/04/12  9:40 AM      Component Value Range   WBC 9.4  4.0 - 10.5 K/uL   RBC 4.00  3.87 - 5.11 MIL/uL   Hemoglobin 11.7 (*) 12.0 - 15.0 g/dL   HCT 36.0  36.0 - 46.0 %   MCV 90.0  78.0 - 100.0 fL   MCH 29.3  26.0 - 34.0 pg   MCHC 32.5  30.0 - 36.0 g/dL   RDW 14.1  11.5 - 15.5 %   Platelets 404 (*) 150 - 400 K/uL   Neutrophils Relative 49  43 - 77 %   Neutro Abs  4.6  1.7 - 7.7 K/uL   Lymphocytes Relative 39  12 - 46 %   Lymphs Abs 3.7  0.7 - 4.0 K/uL   Monocytes Relative 6  3 - 12 %   Monocytes Absolute 0.6  0.1 - 1.0 K/uL   Eosinophils Relative 5  0 - 5 %   Eosinophils Absolute 0.5  0.0 - 0.7 K/uL   Basophils Relative 1  0 - 1 %   Basophils Absolute 0.1  0.0 - 0.1 K/uL  TROPONIN I     Status: Normal   Collection Time   02/04/12  9:40 AM      Component Value Range   Troponin I <0.30  <0.30 ng/mL  URINALYSIS, ROUTINE W REFLEX MICROSCOPIC     Status: Abnormal   Collection Time   02/04/12   12:21 PM      Component Value Range   Color, Urine YELLOW  YELLOW   APPearance CLOUDY (*) CLEAR   Specific Gravity, Urine 1.012  1.005 - 1.030   pH 6.5  5.0 - 8.0   Glucose, UA NEGATIVE  NEGATIVE mg/dL   Hgb urine dipstick NEGATIVE  NEGATIVE   Bilirubin Urine NEGATIVE  NEGATIVE   Ketones, ur NEGATIVE  NEGATIVE mg/dL   Protein, ur NEGATIVE  NEGATIVE mg/dL   Urobilinogen, UA 0.2  0.0 - 1.0 mg/dL   Nitrite NEGATIVE  NEGATIVE   Leukocytes, UA SMALL (*) NEGATIVE  URINE MICROSCOPIC-ADD ON     Status: Abnormal   Collection Time   02/04/12 12:21 PM      Component Value Range   Squamous Epithelial / LPF FEW (*) RARE   WBC, UA 3-6  <3 WBC/hpf   Bacteria, UA RARE  RARE  TROPONIN I     Status: Normal   Collection Time   02/04/12  5:46 PM      Component Value Range   Troponin I <0.30  <0.30 ng/mL  TSH     Status: Normal   Collection Time   02/04/12  5:47 PM      Component Value Range   TSH 0.557  0.350 - 4.500 uIU/mL  CBC     Status: Abnormal   Collection Time   02/04/12  5:47 PM      Component Value Range   WBC 9.2  4.0 - 10.5 K/uL   RBC 3.87  3.87 - 5.11 MIL/uL   Hemoglobin 11.5 (*) 12.0 - 15.0 g/dL   HCT 34.8 (*) 36.0 - 46.0 %   MCV 89.9  78.0 - 100.0 fL   MCH 29.7  26.0 - 34.0 pg   MCHC 33.0  30.0 - 36.0 g/dL   RDW 14.1  11.5 - 15.5 %   Platelets 353  150 - 400 K/uL  CREATININE, SERUM     Status: Abnormal   Collection Time   02/04/12  5:47 PM       Component Value Range   Creatinine, Ser 0.82  0.50 - 1.10 mg/dL   GFR calc non Af Amer 66 (*) >90 mL/min   GFR calc Af Amer 76 (*) >90 mL/min  TROPONIN I     Status: Normal   Collection Time   02/05/12  1:36 AM      Component Value Range   Troponin I <0.30  <0.30 ng/mL    Studies/Results: @RISRSLT24@  Medications:  Reviewed   Patient Active Hospital Problem List: Syncope     Assessment: Patient has noticed increased dizziness with moving around over the past 6 wks  Has cut back on her activities for fear of falling Yesterday was the first syncopal spell she has ever had. ON review of orthostatics from yesterday It looks like BP went up with standing but so did heart rate (46 to 66 bpm)  Will need to repeat to confirm after cath. Patient does say that she gets adequate fluids during the day. Continue telemetry.  Chest pain   CP with DOE  Patient says it has been getting worse. Of note patient had calcification of abdominal aorta on CT yesterday. Patient for R and L heart cath today.  Palpitations  No arrhythmia noted.  Sinus bradycardia (02/04/2012)   Assessment:Continue telemetry. Will be important to have patient ambulate before d/c to track HR response.  May need event monitor as outpatient   Abdominal tenderness.     On exam patient is tender   Appears to have a lot of gas Had colonoscopy in November that showed diverticula  Told to eat more fiber CT yesterday was without signif abnormality.  HL  WIl check fasting lipids.  Needs aggressive control Mackenzy Grumbine 02/05/2012, 8:02 AM   

## 2012-02-06 ENCOUNTER — Encounter (HOSPITAL_COMMUNITY): Payer: Self-pay | Admitting: General Practice

## 2012-02-06 LAB — LIPID PANEL
Cholesterol: 161 mg/dL (ref 0–200)
Triglycerides: 87 mg/dL (ref <150)

## 2012-02-06 LAB — GLUCOSE, CAPILLARY: Glucose-Capillary: 102 mg/dL — ABNORMAL HIGH (ref 70–99)

## 2012-02-06 MED ORDER — SODIUM CHLORIDE 0.9 % IV SOLN
INTRAVENOUS | Status: AC
  Administered 2012-02-06: 17:00:00 via INTRAVENOUS

## 2012-02-06 MED ORDER — POTASSIUM CHLORIDE CRYS ER 20 MEQ PO TBCR
40.0000 meq | EXTENDED_RELEASE_TABLET | Freq: Once | ORAL | Status: AC
  Administered 2012-02-06: 40 meq via ORAL

## 2012-02-06 MED ORDER — DOCUSATE SODIUM 100 MG PO CAPS
100.0000 mg | ORAL_CAPSULE | Freq: Every day | ORAL | Status: DC
  Administered 2012-02-06 – 2012-02-08 (×3): 100 mg via ORAL
  Filled 2012-02-06 (×3): qty 1

## 2012-02-06 NOTE — Progress Notes (Signed)
Cardiology Progress Note Patient Name: Carol Bright Date of Encounter: 02/06/2012, 9:08 AM     Subjective  No chest pain or shortness of breath. Still with dizziness when changing positions. Has not ambulated. C/o constipation    Objective   Telemetry: Sinus rhythm 50-70s, drops into the 30-40s at night  Medications: . atorvastatin  10 mg Oral q1800  . famotidine  10 mg Oral BID WC  . folic acid  1 mg Oral BID  . heparin  5,000 Units Subcutaneous Q8H  . methotrexate  25 mg Oral Q7 days  . predniSONE  3 mg Oral Daily  . sodium chloride  3 mL Intravenous Q12H  . sodium chloride  3 mL Intravenous Q12H   . sodium chloride 50 mL/hr at 02/05/12 0614    Physical Exam: Temp:  [98 F (36.7 C)-98.5 F (36.9 C)] 98 F (36.7 C) (01/14 0605) Pulse Rate:  [42-76] 42  (01/14 0605) Resp:  [16] 16  (01/14 0605) BP: (105-138)/(51-68) 132/68 mmHg (01/14 0605) SpO2:  [92 %-100 %] 97 % (01/14 0605) Weight:  [131 lb 14.4 oz (59.829 kg)] 131 lb 14.4 oz (59.829 kg) (01/14 7829)  General: Well developed elderly female, in no acute distress. Head: Normocephalic, atraumatic, sclera non-icteric, nares are without discharge.  Neck: Supple. Negative for carotid bruits or JVD Lungs: Clear bilaterally to auscultation without wheezes, rales, or rhonchi. Breathing is unlabored. Heart: RRR S1 S2 without murmurs, rubs, or gallops.  Abdomen: Soft, non-tender, non-distended with normoactive bowel sounds. No rebound/guarding. No obvious abdominal masses. Msk:  Strength and tone appear normal for age. Extremities: No edema. No clubbing or cyanosis. Distal pedal pulses are intact and equal bilaterally. Neuro: Alert and oriented X 3. Moves all extremities spontaneously. Psych:  Responds to questions appropriately with a normal affect.   Intake/Output Summary (Last 24 hours) at 02/06/12 0908 Last data filed at 02/05/12 2300  Gross per 24 hour  Intake 1202.4 ml  Output    500 ml  Net  702.4 ml     Labs:  Basename 02/05/12 0800 02/05/12 0500  NA 139 140  K 3.3* 3.4*  CL 103 103  CO2 22 22  GLUCOSE 168* 168*  BUN 11 11  CREATININE 0.79 0.79  CALCIUM 8.9 8.9   Basename 02/04/12 1747 02/04/12 0940  WBC 9.2 9.4  NEUTROABS -- 4.6  HGB 11.5* 11.7*  HCT 34.8* 36.0  MCV 89.9 90.0  PLT 353 404*   Basename 02/05/12 0500 02/05/12 0136 02/04/12 1746 02/04/12 0940  TROPONINI <0.30 <0.30 <0.30 <0.30   Basename 02/06/12 0555  CHOL 161  HDL 55  LDLCALC 89  TRIG 87  CHOLHDL 2.9   Basename 02/04/12 1747  TSH 0.557   Radiology/Studies:   02/04/2012  -  CHEST - 2 VIEW  Findings: Lung volumes are normal.  No consolidative airspace disease.  No pleural effusions.  Mild pulmonary venous congestion without frank pulmonary edema.  Mild cardiomegaly is unchanged. The patient is rotated to the right on today's exam, resulting in distortion of the mediastinal contours and reduced diagnostic sensitivity and specificity for mediastinal pathology. Atherosclerosis in the thoracic aorta.  IMPRESSION: 1.  Mild cardiomegaly with pulmonary venous congestion, but no frank pulmonary edema. 2.  Atherosclerosis.     02/04/2012 -  CT HEAD WITHOUT CONTRAST  Findings: No acute intracranial abnormalities.  Specifically, no definite areas to suggest acute/subacute cerebral ischemia, no evidence of acute intracerebral hemorrhage, no mass, mass effect, hydrocephalus or  abnormal intra or extra-axial fluid collections. Faint physiologic calcifications are noted in the basal ganglia bilaterally (right greater than left).  The visualized paranasal sinuses and mastoids are well pneumatized.  No acute displaced skull fractures are noted.  IMPRESSION: 1.  No acute intracranial abnormalities. 2.  The appearance of the brain is normal for age.     02/04/2012 - CT ABDOMEN AND PELVIS WITH CONTRAST   Findings:  Lung bases:  The heart size appears normal.  There is a small pericardial effusion.  No pleural effusion  identified.  The lung bases appear clear.  Calcified granuloma noted within the right hilar region.  Abdomen/pelvis:  No focal liver abnormality.  Gallbladder appears normal.  No biliary dilatation.  There is no significant biliary dilatation.  Prominence of the pancreatic duct which measures up to 3 mm.  No mass.  The spleen is normal.  Normal appearance of the adrenal glands.  Normal appearance of the left kidney.  There is a cyst within the upper pole of the right kidney measuring 1.5 cm.  Urinary bladder appears normal. Postsurgical change from prior hysterectomy identified.  There is a large area of dystrophic calcification in the left side of vaginal cuff.  Calcified atherosclerotic disease affects the abdominal aorta and its branches.  No aneurysm identified.  There is no upper abdominal adenopathy noted.  No pelvic or inguinal adenopathy.  No free fluid or fluid collections within the abdomen or pelvis.  Small hiatal hernia.  The small bowel loops appear within normal limits.  Normal appearance of the proximal colon.  There is multiple sigmoid diverticula but no acute inflammation.  Bones/Musculoskeletal:  Review of the visualized bony structures is significant for mild degenerative disc disease.  IMPRESSION:  1.  No acute findings identified within the abdomen or the pelvis. 2.  Renal cyst. 3.  Small pericardial effusion.      02/05/12 - R/L Cardiac Cath Hemodynamics  RA 2  RV 21/3  PA 18/5 mean 10  PCWP 4  LV 133/6  AO 127/60  Oxygen saturations:  PA 64  AO 92  Cardiac Output (Fick) 4.9  Cardiac Index (Fick) 3.0  Coronary angiography:  Coronary dominance: right  Left mainstem: Widely patent, no obstructive disease  Left anterior descending (LAD): 20-30% mid-vessel stenosis. Moderate calcification. No high-grade stenosis. The diagonal branches are small in caliber without disease.  Left circumflex (LCx): Widely patent. 2 large OM branches without significant stenoses.  Right coronary  artery (RCA): Dominant vessel. Diffuse 30-40% stenosis in the mid-vessel. PDA branch widely patent.  Left ventriculography: Left ventricular systolic function is normal, LVEF is estimated at 55-65%, there is no significant mitral regurgitation  Final Conclusions:  1. Minor nonobstructive CAD  2. Very low intracardiac filling pressures  Recommendations: Push fluids, avoid antihypertensive medications/diuretics if possible. Recommend observe another 24 hours and ambulate to make sure no further symptoms of orthostasis/syncope.  02/05/12 - Echo Study Conclusions: - Left ventricle: The cavity size was normal. Wall thickness was increased in a pattern of moderate LVH. Systolic function was normal. The estimated ejection fraction was in the range of 55% to 60%. - Atrial septum: No defect or patent foramen ovale was identified. - Pericardium, extracardiac: Small mostly posterolateral pericardial effusion with no tamponade    Assessment and Plan   1. Syncope: Likely due to orthostatic hypotension. No arrhythmias, significant pauses, or heart block on tele. Left heart cath with only minor nonobstructive CAD. Right heart cath showed very low intracardiac filling pressures. Echo  showed EF 55-60%, small posterolateral pericardial effusion without tamponade. She has been hydrated. Will check orthostatics and ambulate today. MD comment on event monitor at DC.  2. Orthostatic Hypotension: BP increased with position change, but so did HR (46 --> 66bpm). Maxzide on hold. Check orthostatics today and ambulate.  3. Sinus Bradycardia: Tele shows rates 50-70s with drops into the 30-40s overnight.  No significant pauses, heart block ( ). Will assess heart rate response with ambulation.  4. Chest pain: Cath showed minor nonobstructive CAD. Chest pain noncardiac in nature. Cont statin  5. Abdominal pain: Abd CT without acute findings. No BM for two days. Will give stool softener  6. Hypokalemia: K+ 3.3  yesterday. Will supplement today  7. Tobacco Abuse   Signed, HOPE, JESSICA PA-C  Patient seen, examined. Available data reviewed. Agree with findings, assessment, and plan as outlined by [facility], PA-C. Pt remains bradycardic without significant pauses. Very weak and dizzy today when up out of bed, heart rate in 40's initially then up to approximately 60. She is off of all antihypertensive meds and has not been on AV nodal blocking agents. Will fluid load her with normal saline again this afternoon and reassess in the AM. Will ask EP to see her tomorrow.  Tonny Bollman, M.D. 02/06/2012 3:23 PM

## 2012-02-06 NOTE — Clinical Social Work Psychosocial (Signed)
     Clinical Social Work Department BRIEF PSYCHOSOCIAL ASSESSMENT 02/06/2012  Patient:  Carol Bright, Carol Bright     Account Number:  [id number]     Admit date:  02/04/2012  Clinical Social Worker:  Margaree Mackintosh  Date/Time:  02/06/2012 03:10 PM  Referred by:  Physician  Date Referred:  02/06/2012 Referred for  Advanced Directives   Other Referral:   Interview type:  Patient Other interview type:   with family present.    PSYCHOSOCIAL DATA Living Status:  ALONE Admitted from facility:   Level of care:   Primary support name:  Calleigh Lafontant: [phone] Primary support relationship to patient:  CHILD, ADULT Degree of support available:   Unknown.    CURRENT CONCERNS Current Concerns  Other - See comment   Other Concerns:   Advance Directives.    SOCIAL WORK ASSESSMENT / PLAN Clinical Social WOrker recieved referral for Advance Directives.  CSW met with pt and family at [email or web address]  CSW introduced self, explained rol,e and provided support.  CSW reviewed HCPOA paperwork.  CSW provided opportunity for quesitons/concerns.  Pt requesting to complete paperwork at a later time.  CSW to sign off at this time, please re consult if needed.   Assessment/plan status:  Information/Referral to [facility] Other assessment/ plan:   Information/referral to community resources:   [employer].    PATIENTS/FAMILYS RESPONSE TO PLAN OF CARE: Pt was pleasant and engaged in conversation. Pt thanked CSW for intervention.

## 2012-02-06 NOTE — Progress Notes (Signed)
Pt ambulated per MD order.   Pt BP prior to ambulation 169/69, HR 69.  Pt ambulated from bed to right outside doorway of room.  Pt c/o dizziness and fatigue.  Pt HR range from 42-62 during ambulation.  Pt escorted to bathroom where pt complained of nausea.  Pt also complaining of headache. PT BP 186/75. Pt escorted back to bed via wheelchair.  Pt c/o feeling "drowsy." Pt HR 42-44 at the time.  MD notified. Will continue to monitor.  Efraim Kaufmann

## 2012-02-07 ENCOUNTER — Encounter (HOSPITAL_COMMUNITY)
Admission: EM | Disposition: A | Discharge status: Home / Self Care | Payer: Self-pay | Source: Home / Self Care | Attending: Emergency Medicine

## 2012-02-07 DIAGNOSIS — I498 Other specified cardiac arrhythmias: Secondary | ICD-10-CM

## 2012-02-07 HISTORY — PX: PERMANENT PACEMAKER INSERTION: SHX5480

## 2012-02-07 LAB — CBC
Hemoglobin: 10.9 g/dL — ABNORMAL LOW (ref 12.0–15.0)
MCH: 29.4 pg (ref 26.0–34.0)
Platelets: 385 10*3/uL (ref 150–400)
RBC: 3.71 MIL/uL — ABNORMAL LOW (ref 3.87–5.11)
WBC: 8.5 10*3/uL (ref 4.0–10.5)

## 2012-02-07 LAB — CREATININE, SERUM
Creatinine, Ser: 0.74 mg/dL (ref 0.50–1.10)
GFR calc Af Amer: >90 mL/min (ref >90)
GFR calc non Af Amer: 78 mL/min — ABNORMAL LOW (ref >90)

## 2012-02-07 SURGERY — PERMANENT PACEMAKER INSERTION
Anesthesia: LOCAL

## 2012-02-07 MED ORDER — SODIUM CHLORIDE 0.9 % IV SOLN: 250.0000 mL | INTRAVENOUS | Status: DC

## 2012-02-07 MED ORDER — SODIUM CHLORIDE 0.9 % IJ SOLN: 3.0000 mL | INTRAMUSCULAR | Status: DC | PRN

## 2012-02-07 MED ORDER — MIDAZOLAM HCL 2 MG/2ML IJ SOLN
INTRAMUSCULAR | Status: AC
  Filled 2012-02-07: qty 2

## 2012-02-07 MED ORDER — FOLIC ACID 1 MG PO TABS: 2.0000 mg | ORAL_TABLET | Freq: Every day | ORAL | Status: DC

## 2012-02-07 MED ORDER — ACETAMINOPHEN 325 MG PO TABS: 325.0000 mg | ORAL_TABLET | ORAL | Status: DC | PRN

## 2012-02-07 MED ORDER — CHLORHEXIDINE GLUCONATE 4 % EX LIQD
60.0000 mL | Freq: Once | CUTANEOUS | Status: DC
  Filled 2012-02-07: qty 15

## 2012-02-07 MED ORDER — HEPARIN (PORCINE) IN NACL 2-0.9 UNIT/ML-% IJ SOLN
INTRAMUSCULAR | Status: AC
  Filled 2012-02-07: qty 1000

## 2012-02-07 MED ORDER — ONDANSETRON HCL 4 MG/2ML IJ SOLN: 4.0000 mg | Freq: Four times a day (QID) | INTRAMUSCULAR | Status: DC | PRN

## 2012-02-07 MED ORDER — SODIUM CHLORIDE 0.9 % IJ SOLN: 3.0000 mL | Freq: Two times a day (BID) | INTRAMUSCULAR | Status: DC

## 2012-02-07 MED ORDER — VANCOMYCIN HCL 10 G IV SOLR
1250.0000 mg | Freq: Two times a day (BID) | INTRAVENOUS | Status: DC
  Filled 2012-02-07: qty 1250

## 2012-02-07 MED ORDER — SODIUM CHLORIDE 0.45 % IV SOLN: INTRAVENOUS | Status: DC

## 2012-02-07 MED ORDER — CHLORHEXIDINE GLUCONATE 4 % EX LIQD
60.0000 mL | Freq: Once | CUTANEOUS | Status: DC
Start: 2012-02-08 — End: 2012-02-07

## 2012-02-07 MED ORDER — ENOXAPARIN SODIUM 40 MG/0.4ML ~~LOC~~ SOLN
40.0000 mg | SUBCUTANEOUS | Status: DC
  Administered 2012-02-08: 40 mg via SUBCUTANEOUS
  Filled 2012-02-07 (×2): qty 0.4

## 2012-02-07 MED ORDER — FENTANYL CITRATE 0.05 MG/ML IJ SOLN
INTRAMUSCULAR | Status: AC
  Filled 2012-02-07: qty 2

## 2012-02-07 MED ORDER — METHOTREXATE 2.5 MG PO TABS: 25.0000 mg | ORAL_TABLET | ORAL | Status: DC

## 2012-02-07 MED ORDER — OXYCODONE-ACETAMINOPHEN 5-325 MG PO TABS
1.0000 | ORAL_TABLET | ORAL | Status: DC | PRN
  Administered 2012-02-07 – 2012-02-08 (×2): 2 via ORAL
  Filled 2012-02-07 (×2): qty 2

## 2012-02-07 MED ORDER — SODIUM CHLORIDE 0.9 % IR SOLN
80.0000 mg | Status: AC
  Administered 2012-02-07: 80 mg
  Filled 2012-02-07 (×2): qty 2

## 2012-02-07 MED ORDER — PREDNISONE 1 MG PO TABS: 3.0000 mg | ORAL_TABLET | Freq: Every day | ORAL | Status: DC

## 2012-02-07 MED ORDER — VANCOMYCIN HCL IN DEXTROSE 1-5 GM/200ML-% IV SOLN
1000.0000 mg | INTRAVENOUS | Status: AC
  Administered 2012-02-07: 1000 mg via INTRAVENOUS
  Filled 2012-02-07 (×2): qty 200

## 2012-02-07 MED ORDER — LIDOCAINE HCL (PF) 1 % IJ SOLN
INTRAMUSCULAR | Status: AC
  Filled 2012-02-07: qty 60

## 2012-02-07 NOTE — Interval H&P Note (Signed)
History and Physical Interval Note:In the interim, the patient continues to have chronotropic incompetence, unable to increase her heart rate above 48/min, despite walking. Will plan PPM  02/07/2012 5:25 PM  Carol Bright  has presented today for surgery, with the diagnosis of Heart block  The various methods of treatment have been discussed with the patient and family. After consideration of risks, benefits and other options for treatment, the patient has consented to  Procedure(s) (LRB) with comments: PERMANENT PACEMAKER INSERTION (N/A) as a surgical intervention .  The patient's history has been reviewed, patient examined, no change in status, stable for surgery.  I have reviewed the patient's chart and labs.  Questions were answered to the patient's satisfaction.     Carol Bright

## 2012-02-07 NOTE — Progress Notes (Signed)
Patient: Carol Bright Date of Encounter: 02/07/2012, 1:58 PM Admit date: 02/04/2012     Subjective  Carol Bright has no complaints this afternoon. She denies CP, SOB or palpitations. She continues to have dizziness with postural changes.   Objective  Physical Exam: Vitals: BP 144/68  Pulse 60  Temp 98.3 F (36.8 C) (Oral)  Resp 18  Ht 4\' 11"  (1.499 m)  Wt 132 lb 11.2 oz (60.192 kg)  BMI 26.80 kg/m2  SpO2 97% General: Well developed, well appearing, elderly 79 year old female in no acute distress. Neck: Supple. JVD not elevated. Lungs: Clear bilaterally to auscultation without wheezes, rales, or rhonchi. Breathing is unlabored. Heart: RRR S1 S2 without murmurs, rubs, or gallops.  Abdomen: Soft, non-distended. Extremities: No clubbing or cyanosis. No edema.  Distal pedal pulses are 2+ and equal bilaterally. Neuro: Alert and oriented X 3. Moves all extremities spontaneously. No focal deficits.  Intake/Output:  Intake/Output Summary (Last 24 hours) at 02/07/12 1358 Last data filed at 02/07/12 1229  Gross per 24 hour  Intake    840 ml  Output   1350 ml  Net   -510 ml    Inpatient Medications:     . atorvastatin  10 mg Oral q1800  . docusate sodium  100 mg Oral Daily  . famotidine  10 mg Oral BID WC  . folic acid  1 mg Oral BID  . heparin  5,000 Units Subcutaneous Q8H  . methotrexate  25 mg Oral Q7 days  . predniSONE  3 mg Oral Daily  . sodium chloride  3 mL Intravenous Q12H  . sodium chloride  3 mL Intravenous Q12H      . sodium chloride 50 mL/hr at 02/07/12 0147    Labs:  Basename 02/05/12 0800 02/05/12 0500  NA 139 140  K 3.3* 3.4*  CL 103 103  CO2 22 22  GLUCOSE 168* 168*  BUN 11 11  CREATININE 0.79 0.79  CALCIUM 8.9 8.9  MG -- --  PHOS -- --    Basename 02/04/12 1747  WBC 9.2  NEUTROABS --  HGB 11.5*  HCT 34.8*  MCV 89.9  PLT 353    Basename 02/05/12 0500 02/05/12 0136 02/04/12 1746  CKTOTAL -- -- --  CKMB -- -- --  TROPONINI <0.30  <0.30 <0.30    Basename 02/06/12 0555  CHOL 161  HDL 55  LDLCALC 89  TRIG 87  CHOLHDL 2.9    Basename 02/04/12 1747  TSH 0.557  T4TOTAL --  T3FREE --  THYROIDAB --    Basename 02/05/12 0805  INR 1.08    Radiology/Studies: Dg Chest 2 View  02/04/2012  *RADIOLOGY REPORT*  Clinical Data: Chest pain.  Syncope.  CHEST - 2 VIEW  Comparison: Chest x-ray 06/04/2011.  Findings: Lung volumes are normal.  No consolidative airspace disease.  No pleural effusions.  Mild pulmonary venous congestion without frank pulmonary edema.  Mild cardiomegaly is unchanged. The patient is rotated to the right on today's exam, resulting in distortion of the mediastinal contours and reduced diagnostic sensitivity and specificity for mediastinal pathology. Atherosclerosis in the thoracic aorta.  IMPRESSION: 1.  Mild cardiomegaly with pulmonary venous congestion, but no frank pulmonary edema. 2.  Atherosclerosis.   Original Report Authenticated By: Trudie Reed, M.D.    Ct Head Wo Contrast  02/04/2012  *RADIOLOGY REPORT*  Clinical Data: Chest pain.  Loss of consciousness.  CT HEAD WITHOUT CONTRAST  Technique:  Contiguous axial images were obtained from  the base of the skull through the vertex without contrast.  Comparison: No priors.  Findings: No acute intracranial abnormalities.  Specifically, no definite areas to suggest acute/subacute cerebral ischemia, no evidence of acute intracerebral hemorrhage, no mass, mass effect, hydrocephalus or abnormal intra or extra-axial fluid collections. Faint physiologic calcifications are noted in the basal ganglia bilaterally (right greater than left).  The visualized paranasal sinuses and mastoids are well pneumatized.  No acute displaced skull fractures are noted.  IMPRESSION: 1.  No acute intracranial abnormalities. 2.  The appearance of the brain is normal for age.   Original Report Authenticated By: Trudie Reed, M.D.    Ct Abdomen Pelvis W Contrast  02/04/2012   *RADIOLOGY REPORT*  Clinical Data: Abdominal pain and syncope  CT ABDOMEN AND PELVIS WITH CONTRAST  Technique:  Multidetector CT imaging of the abdomen and pelvis was performed following the standard protocol during bolus administration of intravenous contrast.  Contrast: OMNIPAQUE IOHEXOL 300 MG/ML  SOLN  Comparison: None  Findings:  Lung bases:  The heart size appears normal.  There is a small pericardial effusion.  No pleural effusion identified.  The lung bases appear clear.  Calcified granuloma noted within the right hilar region.  Abdomen/pelvis:  No focal liver abnormality.  Gallbladder appears normal.  No biliary dilatation.  There is no significant biliary dilatation.  Prominence of the pancreatic duct which measures up to 3 mm.  No mass.  The spleen is normal.  Normal appearance of the adrenal glands.  Normal appearance of the left kidney.  There is a cyst within the upper pole of the right kidney measuring 1.5 cm.  Urinary bladder appears normal. Postsurgical change from prior hysterectomy identified.  There is a large area of dystrophic calcification in the left side of vaginal cuff.  Calcified atherosclerotic disease affects the abdominal aorta and its branches.  No aneurysm identified.  There is no upper abdominal adenopathy noted.  No pelvic or inguinal adenopathy.  No free fluid or fluid collections within the abdomen or pelvis.  Small hiatal hernia.  The small bowel loops appear within normal limits.  Normal appearance of the proximal colon.  There is multiple sigmoid diverticula but no acute inflammation.  Bones/Musculoskeletal:  Review of the visualized bony structures is significant for mild degenerative disc disease.  IMPRESSION:  1.  No acute findings identified within the abdomen or the pelvis. 2.  Renal cyst. 3.  Small pericardial effusion.   Original Report Authenticated By: Signa Kell, M.D.     Echocardiogram: Study Conclusions - Left ventricle: The cavity size was normal.  Wall thickness was increased in a pattern of moderate LVH. Systolic function was normal. The estimated ejection fraction was in the range of 55% to 60%. - Atrial septum: No defect or patent foramen ovale was identified. - Pericardium, extracardiac: Small mostly posterolateral pericardial effusion with no tamponade  Telemetry: NSR at 67 bpm currently; No evidence of AV block or pauses    Assessment and Plan  1. Syncope  Likely due to orthostatic hypotension. No arrhythmias, AV block or significant pauses on tele. Left heart cath with only minor nonobstructive CAD. Right heart cath showed very low intracardiac filling pressures. Echo showed EF 55-60%, small posterolateral pericardial effusion without tamponade. She has been hydrated. Will check orthostatics and ambulate today. MD to comment on need for event monitor at DC.  2. Orthostatic hypotension  Maxide held and patient hydrated 3. Sinus bradycardia Tele shows rates 40-60s. No significant pauses or AV block.  Attempted to ambulate today but patient felt dizzy, "hot" and need to sit down after ambulating from room to just outside door in hallway. HR sustained in the 40s. 4. Chest pain Cardiac cath showed minor nonobstructive CAD. Chest pain noncardiac in nature. Continue risk factor modification. Continue statin.  5. Abdominal pain Abd CT without acute findings.  6. Hypokalemia  Given K+ yesterday. Will repeat BMP in AM. 7. Tobacco abuse  Smoking cessation advised.  Signed, Exie Parody  EP attending  Patient seen and examined. Discussed with Dr. Graciela Husbands and Janora Norlander, PA-C. With ambulation, her heart rate did not get above 45/min. I have recommended insertion of a PPM with minute ventilation rate response. The risks/benefits/goals/expectations of the procedure have been discussed with the patient and she wishes to proceed.

## 2012-02-07 NOTE — Op Note (Signed)
DDD PPM insertion via the right subclavian vein without immediate complication. Z#[id number]

## 2012-02-08 ENCOUNTER — Observation Stay (HOSPITAL_COMMUNITY): Payer: Medicare Other

## 2012-02-08 DIAGNOSIS — R0989 Other specified symptoms and signs involving the circulatory and respiratory systems: Secondary | ICD-10-CM

## 2012-02-08 DIAGNOSIS — E785 Hyperlipidemia, unspecified: Secondary | ICD-10-CM

## 2012-02-08 DIAGNOSIS — R0609 Other forms of dyspnea: Secondary | ICD-10-CM

## 2012-02-08 LAB — BASIC METABOLIC PANEL
BUN: 8 mg/dL (ref 6–23)
Chloride: 105 mEq/L (ref 96–112)
Creatinine, Ser: 0.75 mg/dL (ref 0.50–1.10)
GFR calc Af Amer: >90 mL/min (ref >90)
Glucose, Bld: 119 mg/dL — ABNORMAL HIGH (ref 70–99)

## 2012-02-08 MED ORDER — OXYCODONE-ACETAMINOPHEN 5-325 MG PO TABS: 1.0000 | ORAL_TABLET | ORAL | Status: DC | PRN

## 2012-02-08 MED ORDER — DSS 100 MG PO CAPS: 100.0000 mg | ORAL_CAPSULE | Freq: Every day | ORAL | Status: DC

## 2012-02-08 NOTE — Progress Notes (Signed)
Subjective: Chest wall sore.  No other CP  No SOB Objective: Filed Vitals:   02/07/12 2100 02/07/12 2200 02/07/12 2300 02/08/12 0400  BP: 177/103 116/53 116/53 158/71  Pulse: 64   60  Temp: 97.4 F (36.3 C)   98.3 F (36.8 C)  TempSrc:      Resp: 18   18  Height:      Weight:    133 lb 11.2 oz (60.646 kg)  SpO2: 96%   96%   Weight change: 1 lb (0.454 kg)  Intake/Output Summary (Last 24 hours) at 02/08/12 0845 Last data filed at 02/07/12 1229  Gross per 24 hour  Intake    480 ml  Output    800 ml  Net   -320 ml    General: Alert, awake, oriented x3, in no acute distress Neck:  JVP is normal R chest:  Dressing dry. Heart: Regular rate and rhythm, without murmurs, rubs, gallops.  Lungs: Clear to auscultation.  No rales or wheezes. Exemities:  No edema.   Neuro: Grossly intact, nonfocal.  Tele:  Atrial paced 60 Lab Results: Results for orders placed during the hospital encounter of 02/04/12 (from the past 24 hour(s))  CBC     Status: Abnormal   Collection Time   02/07/12  8:19 PM      Component Value Range   WBC 8.5  4.0 - 10.5 K/uL   RBC 3.71 (*) 3.87 - 5.11 MIL/uL   Hemoglobin 10.9 (*) 12.0 - 15.0 g/dL   HCT 29.5 (*) 62.1 - 30.8 %   MCV 89.5  78.0 - 100.0 fL   MCH 29.4  26.0 - 34.0 pg   MCHC 32.8  30.0 - 36.0 g/dL   RDW 65.7  84.6 - 96.2 %   Platelets 385  150 - 400 K/uL  CREATININE, SERUM     Status: Abnormal   Collection Time   02/07/12  8:19 PM      Component Value Range   Creatinine, Ser 0.74  0.50 - 1.10 mg/dL   GFR calc non Af Amer 78 (*) >90 mL/min   GFR calc Af Amer >90  >90 mL/min  BASIC METABOLIC PANEL     Status: Abnormal   Collection Time   02/08/12  5:25 AM      Component Value Range   Sodium 138  135 - 145 mEq/L   Potassium 3.8  3.5 - 5.1 mEq/L   Chloride 105  96 - 112 mEq/L   CO2 22  19 - 32 mEq/L   Glucose, Bld 119 (*) 70 - 99 mg/dL   BUN 8  6 - 23 mg/dL   Creatinine, Ser 9.52  0.50 - 1.10 mg/dL   Calcium 9.1  8.4 - 84.1 mg/dL   GFR calc  non Af Amer 78 (*) >90 mL/min   GFR calc Af Amer >90  >90 mL/min    Studies/Results: @RISRSLT24 @  Medications: REviewed   Patient Active Hospital Problem List: Syncope () Patient had low Venous pressures at cath  Diuretic has been D/c'd  She also showed signs of chronotropic incompetence  Now s/p PPM instertion Will follow  Ambulate Chest pain (12/27/2010)   Cath showed no signif CAD  Follow Dyspnea on exertion () Will follow now that pacer placed  Orthostatic lightheadedness ()  Follow BP now.  Actually high  May need to add anotehr agent.  Avoid diuretic and vasodilators. Sinus bradycardia (02/04/2012)   PPM HL  Continue statin  Will have PT evaluate  patient. Ambulate  Poss D/C home if PT thinks able.  LOS: 4 days   Dietrich Pates 02/08/2012, 8:45 AM

## 2012-02-08 NOTE — Progress Notes (Signed)
Pt discharged to home per MD order. Pt and family received and reviewed all discharge instructions and medication information including follow-up appointments, prescriptions and activity restrictions.  Pt and family verbalized understanding. Pt discharged with all home equipment  (rolling walker and 3 in 1).  Pt alert and oriented at discharge with no complaints. Pt escorted to private vehicle via wheelchair by nurse tech. Efraim Kaufmann

## 2012-02-08 NOTE — Evaluation (Signed)
Have collaborated with this patient and the note below. 02/08/2012  Halfway Bing, PT [phone] (pager)

## 2012-02-08 NOTE — Op Note (Signed)
Carol Bright, SAINTIL NO.:  [id number]  MEDICAL RECORD NO.:  [id number]  LOCATION:  [id number]                        FACILITY:  [facility]  PHYSICIAN:  Doylene Canning. Ladona Ridgel, MD    DATE OF BIRTH:  10-28-1930  DATE OF PROCEDURE:  02/07/2012 DATE OF DISCHARGE:                              OPERATIVE REPORT   PROCEDURE PERFORMED:  Insertion of a dual-chamber pacemaker.  INDICATION:  Symptomatic bradycardia.  INTRODUCTION: The patient is an 79 year old woman with a history of bradycardia and chronotropic incompetence.  She was admitted to the hospital with fatigue and weakness.  She had syncope.  She was found to be bradycardic with heart rates in the 20s as up to the low 40s.  With exertion, her heart rate would go up to the mid to high 40s, but then no higher and she is now referred for dual-chamber pacemaker insertion.  PROCEDURE:  After informed consent was obtained, the patient was taken to the diagnostic EP lab in a fasting state.  After usual preparation and draping, intravenous fentanyl and midazolam was given for sedation. A 30 mL of lidocaine was infiltrated into the right infraclavicular region.  A 5-cm incision was carried out over this region. Electrocautery was utilized to dissect down the fascial plane.  The right subclavian vein was then punctured x2 and the [employer] model [phone], active fixation pacing lead, serial [id number] was advanced to the right ventricle and the [employer] model [phone], active fixation pacing lead, serial #[id number] was advanced into the right atrium.  Mapping in the right ventricle was carried out.  At the final site, the R-waves were measured at 16, the impedance was 700, the threshold 0.5 V at 0.4 milliseconds.  With active fixation of the lead, there was large injury current.  With the ventricular lead in satisfactory position, attention was then turned to the atrial lead which was placed in anterolateral  portion of the right atrium where P- waves measured 2 mV.  The impedance was 500 ohms and threshold 0.8 V at 0.4 milliseconds.  Again, 10 V pacing not stimulate with the diaphragm and again there was large injury current with active fixation lead. With these satisfactory parameters, the leads were secured to the subpectoral fascia with a figure-of-eight silk suture.  Sewing sleeve was secured with silk suture.  Electrocautery was utilized to make subcutaneous pocket.  Antibiotic irrigation was utilized to irrigate the pocket.  Electrocautery was utilized to assure hemostasis.  The [facility] [employer]vant[employer] dual-chamber pacemaker serial [id number] was connected to the atrial and RV leads and placed back in the subcutaneous pocket. The pocket was irrigated with antibiotic irrigation and the incision was closed with 2-0 and 3-0 Vicryl.  Benzoin and Steri-Strips were painted in the skin.  Pressure dressing applied and the patient was returned to her room in satisfactory condition.  COMPLICATIONS:  There were no immediate procedure complications.  RESULTS:  This demonstrates successful implantation of a [employer] dual-chamber pacemaker in a patient with symptomatic bradycardia.     Doylene Canning. Ladona Ridgel, MD     GWT/MEDQ  D:  02/07/2012  T:  02/08/2012  Job:  [id number]  cc:  Pricilla Riffle, MD, [facility]

## 2012-02-08 NOTE — Evaluation (Signed)
Physical Therapy Evaluation Patient Details Name: Carol Bright MRN: [id number] DOB: [birth year] Today's Date: 02/08/2012 Time: 4098-1191 PT Time Calculation (min): 47 min  PT Assessment / Plan / Recommendation Clinical Impression  Pt admitted with syncope episode, ICD implanted on 02/07/12.  Pt is limited in mobility, activity tolerance, and balance.  Reports dizziness intermittently upon standing and walking.  Discussed use of Rolator for assistance and incr safety with ambulation, pt and family verbalize understanding.Based on these limitations and pt's statement that she does not get off the couch some days, I recommend HHPT with 24hr help.  Discussed in detail the difference between HHPT and SNF, the benefits and risks of each, patient adimently refuses to go to SNF.  Family and Pt all verbalize understanding of my recommendation.      PT Assessment  Patient needs continued PT services    Follow Up Recommendations  Home health PT;Supervision/Assistance - 24 hour       Barriers to Discharge Decreased caregiver support      Equipment Recommendations  Rolling walker with 5" wheels; 3 in 1       Frequency Min 3X/week    Precautions / Restrictions Precautions Precautions: Fall;ICD/Pacemaker Restrictions Weight Bearing Restrictions: No   Pertinent Vitals/Pain HR 50s-60s O2 sats >94% 4/10 pain Reports dizziness       Mobility  Bed Mobility Bed Mobility: Supine to Sit;Sitting - Scoot to Edge of Bed Supine to Sit: 4: Min assist;HOB flat Sitting - Scoot to [place] of Bed: 5: Supervision Details for Bed Mobility Assistance: req A to obtain sitting position, unable to push off bed with RUE at this time.  Cues to limit UE use and sequencing to scoot to edge of bed Transfers Transfers: Sit to Stand;Stand to Sit Sit to Stand: 4: Min assist;With upper extremity assist;From bed Stand to Sit: 4: Min guard;With upper extremity assist;To bed;To chair/3-in-1 Details for Transfer  Assistance: pt states she typically uses furniture to A in transfer at home, req min A to stand from bed and assist to stabilize balance upon standing, reports incr dizziness with standing, goes away after however returns throughout activity Ambulation/Gait Ambulation/Gait Assistance: 3: Mod assist Ambulation Distance (Feet): 30 Feet Assistive device: 1 person hand held assist Ambulation/Gait Assistance Details: walked 57ft, standing break, 10 ft, then seated break, 10 feet around bed to chair.  Req A and reports dizziness intermittently, mod postural sway, very slow shuffled gait which family states is decr from baseline but similar characteristics; pt closes eyes often throughout gait Gait Pattern: Step-to pattern;Decreased step length - right;Decreased step length - left;Decreased stride length;Decreased hip/knee flexion - right;Decreased hip/knee flexion - left;Shuffle Gait velocity: decr Stairs: No Wheelchair Mobility Wheelchair Mobility: No       Exercises General Exercises - Lower Extremity Ankle Circles/Pumps: AROM;Both;10 reps;Seated Long Arc Quad: AROM;Both;10 reps;Seated Hip Flexion/Marching: AROM;Both;10 reps;Seated   PT Diagnosis: Difficulty walking  PT Problem List: Decreased strength;Decreased range of motion;Decreased activity tolerance;Decreased balance;Decreased mobility;Decreased coordination;Decreased knowledge of use of DME PT Treatment Interventions: DME instruction;Gait training;Functional mobility training;Therapeutic activities;Therapeutic exercise;Balance training   PT Goals Acute Rehab PT Goals PT Goal Formulation: With patient Time For Goal Achievement: 02/22/12 Potential to Achieve Goals: Good Pt will go Supine/Side to Sit: Independently PT Goal: Supine/Side to Sit - Progress: Goal set today Pt will Sit at Edge of Bed: Independently PT Goal: Sit at Edge Of Bed - Progress: Goal set today Pt will go Sit to Supine/Side: Independently PT Goal: Sit to  Supine/Side -  Progress: Goal set today Pt will go Sit to Stand: Independently PT Goal: Sit to Stand - Progress: Goal set today Pt will go Stand to Sit: Independently PT Goal: Stand to Sit - Progress: Goal set today Pt will Transfer Bed to Chair/Chair to Bed: Independently PT Transfer Goal: Bed to Chair/Chair to Bed - Progress: Goal set today Pt will Stand: Independently PT Goal: Stand - Progress: Goal set today Pt will Ambulate: 51 - 150 feet;with rolling walker PT Goal: Ambulate - Progress: Goal set today  Visit Information  Last PT Received On: 02/08/12 Assistance Needed: +1    Subjective Data  Subjective: "Can I go home?" Patient Stated Goal: To go home   Prior Functioning  Home Living Lives With: Alone Available Help at Discharge: Family;Available PRN/intermittently;Other (Comment) (family willing to try to get aid for 24hr care) Type of Home: House Home Access: Stairs to enter [employer] of Steps: 3-6 Entrance Stairs-Rails: Right (broken ) Home Layout: One level Bathroom Shower/Tub: Other (comment) (pt sits edge of tub to sponge bath, full bath 1x/wk) Bathroom Toilet: Standard Home Adaptive Equipment: Straight cane Prior Function Level of Independence: Needs assistance Needs Assistance: Bathing;Light Housekeeping Bath: Minimal Light Housekeeping: Minimal Driving: No Vocation: Retired Comments: Pt lives alone, per dtr pt has shown decline in activty level and mobility since November, pt inconsistantly cooks and cleans, req A sometimes to complete task Communication Communication: No difficulties Dominant Hand: Right    Cognition  Overall Cognitive Status: Appears within functional limits for tasks assessed/performed Arousal/Alertness: Awake/alert Orientation Level: Oriented X4 / Intact Behavior During Session: WFL for tasks performed Cognition - Other Comments: Pt has very quick emotionally negative response when discussing SNF as option at d/c,  stating "I'm not going to no home."    Extremity/Trunk Assessment Right Upper Extremity Assessment RUE ROM/Strength/Tone: Deficits RUE ROM/Strength/Tone Deficits: in sling; ICD placed on Right side Left Upper Extremity Assessment LUE ROM/Strength/Tone: Deficits LUE ROM/Strength/Tone Deficits: generalized weakness Right Lower Extremity Assessment RLE ROM/Strength/Tone: Deficits RLE ROM/Strength/Tone Deficits: knee and ankle weakness greater on right due to pain secondary to RA RLE Sensation: WFL - Light Touch Left Lower Extremity Assessment LLE ROM/Strength/Tone: Deficits LLE ROM/Strength/Tone Deficits: generalized weakness Trunk Assessment Trunk Assessment: Normal   Balance Balance Balance Assessed: Yes Static Sitting Balance Static Sitting - Balance Support: Left upper extremity supported;Feet supported Static Sitting - Level of Assistance: 5: Stand by assistance Static Sitting - Comment/# of Minutes: initially req min A due to dizziness, when dizziness subsided pt able to maintain sitting EOB with min postural adjustments Static Standing Balance Static Standing - Balance Support: Left upper extremity supported;During functional activity Static Standing - Level of Assistance: 3: Mod assist Static Standing - Comment/# of Minutes: Pt with intermittent dizziness and mod postural sway; pt tends to close eyes often  End of Session PT - End of Session Activity Tolerance: Patient limited by fatigue Patient left: in chair;with call bell/phone within reach;with family/visitor present Nurse Communication: Mobility status;Other (comment) (d/c options and plan)  GP     Sharion Balloon 02/08/2012, 4:34 PM Sharion Balloon, SPT Acute Rehab Services [phone]

## 2012-02-08 NOTE — Discharge Summary (Signed)
CARDIOLOGY DISCHARGE SUMMARY   Patient ID: Carol Bright MRN: [id number] DOB/AGE: [birth year] 79 y.o.  Admit date: 02/04/2012 Discharge date: 02/08/2012  Primary Discharge Diagnosis:   *Syncope - S/P [employer] Advantio dual-chamber pacemaker serial #[id number]   Secondary Discharge Diagnosis:   Chest pain  Dyspnea on exertion  Orthostatic lightheadedness  Palpitations  Sinus bradycardia  Procedures:  2D echocardiogram, CT of the head, CT of the abdomen and pelvis,Right Heart Cath, Left Heart Cath, Selective Coronary Angiography, LV angiography, insertion of a dual-lead pacemaker    Hospital Course:  Carol Bright is an 79 year old female with a previously normal Myoview. She had a syncopal episode and was found to be bradycardic. She was also having dyspnea on exertion. She was admitted for further evaluation and treatment.  She had been having some chest pain. Her cardiac enzymes were negative but because of her current chest pain as well as the syncope and shortness of breath, she had a right and left heart catheterization on 02/05/2011. The full results are below but she had nonobstructive coronary artery disease and low filling pressures. A 2-D echocardiogram showed a preserved EF with a small effusion. The syncope was likely due to orthostatic hypotension. This improved with hydration. On telemetry she had bradycardia and continued to complain of dizziness with ambulation. She was in sinus bradycardia but her heart rate did not increase with ambulation. Dr. Ladona Ridgel evaluated Carol Bright and recommended a permanent pacemaker. She had a dual-lead pacemaker inserted on 02/07/2012 for symptomatic bradycardia.   On 02/08/2012, her chest x-ray showed no acute abnormality. Her device was interrogated and she was seen by Dr. Tenny Craw. Her blood pressure medications have been stopped and she is to follow her blood pressure as an outpatient, with further decisions on blood pressure control to be made  later. She was seen by physical therapy as well. They have evaluated her, and recommend HHRN, HHPT and an aide as well as 24-hour supervision. Once this is arranged, she will be ready for discharge, to followup as an outpatient.   Labs:   Lab Results  Component Value Date   WBC 8.5 02/07/2012   HGB 10.9* 02/07/2012   HCT 33.2* 02/07/2012   MCV 89.5 02/07/2012   PLT 385 02/07/2012     Lab 02/08/12 0525  NA 138  K 3.8  CL 105  CO2 22  BUN 8  CREATININE 0.75  CALCIUM 9.1  PROT --  BILITOT --  ALKPHOS --  ALT --  AST --  GLUCOSE 119*   Lab Results  Component Value Date   TROPONINI <0.30 02/05/2012   Lipid Panel     Component Value Date/Time   CHOL 161 02/06/2012 0555   TRIG 87 02/06/2012 0555   HDL 55 02/06/2012 0555   CHOLHDL 2.9 02/06/2012 0555   VLDL 17 02/06/2012 0555   LDLCALC 89 02/06/2012 0555    No results found for this basename: probnp   No results found for this basename: INR in the last 72 hours    Radiology: Dg Chest 2 View  02/08/2012  *RADIOLOGY REPORT*  Clinical Data: Status post pacemaker placement.  Chest and arm pain.  CHEST - 2 VIEW  Comparison: 02/04/2012  Findings: A new dual lead transvenous pacemaker is seen with leads in appropriate position in the right atrium and right ventricle. No evidence of pneumothorax.  Cardiomegaly stable.  No evidence of pulmonary infiltrate or edema. No evidence of pleural effusion.  No mass or lymphadenopathy identified.  IMPRESSION:  1.  New transvenous pacemaker in appropriate position.  No evidence of pneumothorax or other acute findings. 2.  Stable cardiomegaly.   Original Report Authenticated By: Myles Rosenthal, M.D.    Dg Chest 2 View  02/04/2012  *RADIOLOGY REPORT*  Clinical Data: Chest pain.  Syncope.  CHEST - 2 VIEW  Comparison: Chest x-ray 06/04/2011.  Findings: Lung volumes are normal.  No consolidative airspace disease.  No pleural effusions.  Mild pulmonary venous congestion without frank pulmonary edema.  Mild  cardiomegaly is unchanged. The patient is rotated to the right on today's exam, resulting in distortion of the mediastinal contours and reduced diagnostic sensitivity and specificity for mediastinal pathology. Atherosclerosis in the thoracic aorta.  IMPRESSION: 1.  Mild cardiomegaly with pulmonary venous congestion, but no frank pulmonary edema. 2.  Atherosclerosis.   Original Report Authenticated By: Trudie Reed, M.D.    Ct Head Wo Contrast  02/04/2012  *RADIOLOGY REPORT*  Clinical Data: Chest pain.  Loss of consciousness.  CT HEAD WITHOUT CONTRAST  Technique:  Contiguous axial images were obtained from the base of the skull through the vertex without contrast.  Comparison: No priors.  Findings: No acute intracranial abnormalities.  Specifically, no definite areas to suggest acute/subacute cerebral ischemia, no evidence of acute intracerebral hemorrhage, no mass, mass effect, hydrocephalus or abnormal intra or extra-axial fluid collections. Faint physiologic calcifications are noted in the basal ganglia bilaterally (right greater than left).  The visualized paranasal sinuses and mastoids are well pneumatized.  No acute displaced skull fractures are noted.  IMPRESSION: 1.  No acute intracranial abnormalities. 2.  The appearance of the brain is normal for age.   Original Report Authenticated By: Trudie Reed, M.D.    Ct Abdomen Pelvis W Contrast  02/04/2012  *RADIOLOGY REPORT*  Clinical Data: Abdominal pain and syncope  CT ABDOMEN AND PELVIS WITH CONTRAST  Technique:  Multidetector CT imaging of the abdomen and pelvis was performed following the standard protocol during bolus administration of intravenous contrast.  Contrast: OMNIPAQUE IOHEXOL 300 MG/ML  SOLN  Comparison: None  Findings:  Lung bases:  The heart size appears normal.  There is a small pericardial effusion.  No pleural effusion identified.  The lung bases appear clear.  Calcified granuloma noted within the right hilar region.   Abdomen/pelvis:  No focal liver abnormality.  Gallbladder appears normal.  No biliary dilatation.  There is no significant biliary dilatation.  Prominence of the pancreatic duct which measures up to 3 mm.  No mass.  The spleen is normal.  Normal appearance of the adrenal glands.  Normal appearance of the left kidney.  There is a cyst within the upper pole of the right kidney measuring 1.5 cm.  Urinary bladder appears normal. Postsurgical change from prior hysterectomy identified.  There is a large area of dystrophic calcification in the left side of vaginal cuff.  Calcified atherosclerotic disease affects the abdominal aorta and its branches.  No aneurysm identified.  There is no upper abdominal adenopathy noted.  No pelvic or inguinal adenopathy.  No free fluid or fluid collections within the abdomen or pelvis.  Small hiatal hernia.  The small bowel loops appear within normal limits.  Normal appearance of the proximal colon.  There is multiple sigmoid diverticula but no acute inflammation.  Bones/Musculoskeletal:  Review of the visualized bony structures is significant for mild degenerative disc disease.  IMPRESSION:  1.  No acute findings identified within the abdomen or the pelvis. 2.  Renal cyst. 3.  Small pericardial  effusion.   Original Report Authenticated By: Signa Kell, M.D.    Cardiac cath: 02/05/2011 Left mainstem: Widely patent, no obstructive disease  Left anterior descending (LAD): 20-30% mid-vessel stenosis. Moderate calcification. No high-grade stenosis. The diagonal branches are small in caliber without disease.  Left circumflex (LCx): Widely patent. 2 large OM branches without significant stenoses.  Right coronary artery (RCA): Dominant vessel. Diffuse 30-40% stenosis in the mid-vessel. PDA branch widely patent.  Left ventriculography: Left ventricular systolic function is normal, LVEF is estimated at 55-65%, there is no significant mitral regurgitation  Final Conclusions:  1. Minor  nonobstructive CAD  2. Very low intracardiac filling pressures  EKG: 08-Feb-2012 04:37:59 [facility] ROUTINE RECORD Atrial-paced rhythm with prolonged AV conduction Abnormal ECG 27mm/s 51mm/mV 100Hz  8.0.1 12SL 241 HD CID: 1 Referred by: Unconfirmed Vent. rate 60 BPM PR interval 232 ms QRS duration 90 ms QT/QTc 390/390 ms P-R-T axes 55 22 60   Echo: 02/05/2012 Study Conclusions - Left ventricle: The cavity size was normal. Wall thickness was increased in a pattern of moderate LVH. Systolic function was normal. The estimated ejection fraction was in the range of 55% to 60%. - Atrial septum: No defect or patent foramen ovale was identified. - Pericardium, extracardiac: Small mostly posterolateral pericardial effusion with no tamponade    FOLLOW UP PLANS AND APPOINTMENTS Allergies  Allergen Reactions  . Penicillins Hives     Medication List     As of 02/08/2012  5:06 PM    STOP taking these medications         triamterene-hydrochlorothiazide 37.5-25 MG per tablet   Commonly known as: MAXZIDE-25      TAKE these medications         CENTRUM SILVER PO   Take 1 tablet by mouth daily.      CITRACAL PO   Take by mouth daily.      CRESTOR 5 MG tablet   Generic drug: rosuvastatin   Take 1 tablet (5 mg total) by mouth at bedtime.      DSS 100 MG Caps   Take 100 mg by mouth daily.      famotidine 10 MG chewable tablet   Commonly known as: PEPCID AC   Chew 10 mg by mouth 2 (two) times daily.      folic acid 1 MG tablet   Commonly known as: FOLVITE   Take 2 mg by mouth daily.      methotrexate 2.5 MG tablet   Commonly known as: RHEUMATREX   Take 25 mg by mouth once a week. Takes 10 tablets on Sunday. Caution:Chemotherapy. Protect from light.      oxyCODONE-acetaminophen 5-325 MG per tablet   Commonly known as: PERCOCET/ROXICET   Take 1-2 tablets by mouth every 4 (four) hours as needed.      predniSONE 1 MG tablet   Commonly known as:  DELTASONE   Take 3 mg by mouth daily. Takes 3 tablets daily          Discharge Orders    Future Appointments: Provider: Department: Dept Phone: Center:   02/15/2012 3:30 PM [facility] Main Office [place]) [phone] [facility]   05/17/2012 12:30 PM Marinus Maw, MD [facility] Main Office [place]) [phone] [facility]     Follow-up Information    Follow up with [facility] On 02/15/2012. (Wound and Device check at 3:30 pm)    Contact information:   [address] [place] [address]  Follow up with Lewayne Bunting, MD. On 04/16/2012. (at 12:30 pm)    Contact information:   [address] [place] [address] [phone]          BRING ALL MEDICATIONS WITH YOU TO FOLLOW UP APPOINTMENTS  Time spent with patient to include physician time: 35 min Signed: Theodore Demark 02/08/2012, 5:06 PM Co-Sign MD

## 2012-02-15 ENCOUNTER — Ambulatory Visit (INDEPENDENT_AMBULATORY_CARE_PROVIDER_SITE_OTHER): Payer: Medicare Other | Admitting: *Deleted

## 2012-02-15 DIAGNOSIS — R001 Bradycardia, unspecified: Secondary | ICD-10-CM

## 2012-02-15 DIAGNOSIS — I498 Other specified cardiac arrhythmias: Secondary | ICD-10-CM

## 2012-02-15 LAB — PACEMAKER DEVICE OBSERVATION
AL AMPLITUDE: 3.6 mv
AL IMPEDENCE PM: 499 Ohm
ATRIAL PACING PM: 79
RV LEAD AMPLITUDE: 18.9 mv
RV LEAD IMPEDENCE PM: 645 Ohm
VENTRICULAR PACING PM: 1

## 2012-02-15 NOTE — Progress Notes (Signed)
Wound check-PPM 

## 2012-02-20 ENCOUNTER — Telehealth: Payer: Self-pay | Admitting: *Deleted

## 2012-02-20 MED ORDER — AMLODIPINE BESYLATE 2.5 MG PO TABS: 2.5000 mg | ORAL_TABLET | Freq: Every day | ORAL | Status: DC

## 2012-02-20 NOTE — Telephone Encounter (Signed)
Spoke with patient's daughter Gavin Pound) and patient . Both aware to start Norvasc 2.5mg  every day and keep track of BP and let us know the trend.

## 2012-02-20 NOTE — Telephone Encounter (Signed)
Received notice from patient's daughter that her BP has been running 160/80's. Today it was 178/81. Complaining of dizziness and headache. Was taking a beta blocker prior to pacer implant but stopped due to bradycardia. Advised will let Dr.Ross know and call back. Call back phone number is [phone].

## 2012-02-20 NOTE — Telephone Encounter (Signed)
Recommend 2.5 mg amlodipine.  Follow BP over next wk and call.

## 2012-02-27 ENCOUNTER — Telehealth: Payer: Self-pay | Admitting: Internal Medicine

## 2012-02-27 NOTE — Telephone Encounter (Signed)
New Problem     [facility] Needs clearance to continue PT & OT.

## 2012-02-27 NOTE — Telephone Encounter (Signed)
Approved.  

## 2012-02-29 ENCOUNTER — Telehealth: Payer: Self-pay | Admitting: *Deleted

## 2012-02-29 ENCOUNTER — Encounter: Payer: Self-pay | Admitting: Internal Medicine

## 2012-02-29 NOTE — Telephone Encounter (Signed)
Patient's daughter Anuradha Chabot) reported that her mother had an episode of chest burning yesterday. She had ran out of her Pepcid AC. I called the patient and discussed above with her. She restarted the Pepcid [facility] yesterday and felt better. Advised her to stay on the Pepcid [facility] every day and call us if she has any recurrent episodes of chest burning. Will let Dr.Ross know.

## 2012-04-04 ENCOUNTER — Other Ambulatory Visit: Payer: Self-pay | Admitting: *Deleted

## 2012-04-04 MED ORDER — AMLODIPINE BESYLATE 2.5 MG PO TABS: 2.5000 mg | ORAL_TABLET | Freq: Every day | ORAL | Status: DC

## 2012-05-17 ENCOUNTER — Ambulatory Visit (INDEPENDENT_AMBULATORY_CARE_PROVIDER_SITE_OTHER): Payer: Medicare Other | Admitting: Internal Medicine

## 2012-05-17 ENCOUNTER — Encounter: Payer: Self-pay | Admitting: Internal Medicine

## 2012-05-17 VITALS — BP 155/75 | HR 67 | Ht 59.0 in | Wt 138.4 lb

## 2012-05-17 DIAGNOSIS — I498 Other specified cardiac arrhythmias: Secondary | ICD-10-CM

## 2012-05-17 DIAGNOSIS — Z950 Presence of cardiac pacemaker: Secondary | ICD-10-CM

## 2012-05-17 DIAGNOSIS — R001 Bradycardia, unspecified: Secondary | ICD-10-CM

## 2012-05-17 DIAGNOSIS — I10 Essential (primary) hypertension: Secondary | ICD-10-CM

## 2012-05-17 DIAGNOSIS — R55 Syncope and collapse: Secondary | ICD-10-CM

## 2012-05-17 LAB — PACEMAKER DEVICE OBSERVATION
AL IMPEDENCE PM: 471 Ohm
ATRIAL PACING PM: 36
RV LEAD IMPEDENCE PM: 632 Ohm
RV LEAD THRESHOLD: 0.5 V
VENTRICULAR PACING PM: 1

## 2012-05-17 NOTE — Patient Instructions (Addendum)
Your physician wants you to follow-up in: 9 months with Dr Nash Dimmer will receive a reminder letter in the mail two months in advance. If you don't receive a letter, please call our office to schedule the follow-up appointment.

## 2012-05-18 ENCOUNTER — Encounter: Payer: Self-pay | Admitting: Internal Medicine

## 2012-05-18 DIAGNOSIS — Z950 Presence of cardiac pacemaker: Secondary | ICD-10-CM | POA: Insufficient documentation

## 2012-05-18 NOTE — Progress Notes (Signed)
HPI Carol Bright returns today for followup. She is a pleasant 79 yo woman with symptomatic bradycardia, syncope, status post pacemaker insertion, hypertension, and rheumatoid arthritis. In the interim, she has been stable. She denies syncope, chest pain, or shortness of breath. Her arthritis has been fairly well controlled. Allergies  Allergen Reactions  . Penicillins Hives     Current Outpatient Prescriptions  Medication Sig Dispense Refill  . amLODipine (NORVASC) 2.5 MG tablet Take 1 tablet (2.5 mg total) by mouth daily.  30 tablet  6  . Calcium Citrate (CITRACAL PO) Take by mouth daily.      Marland Kitchen docusate sodium 100 MG CAPS Take 100 mg by mouth daily.  10 capsule    . famotidine (PEPCID AC) 10 MG chewable tablet Chew 10 mg by mouth 2 (two) times daily.      . folic acid (FOLVITE) 1 MG tablet Take 2 mg by mouth daily.      . methotrexate (RHEUMATREX) 2.5 MG tablet Take 25 mg by mouth once a week. Takes 10 tablets on Sunday. Caution:Chemotherapy. Protect from light.      . Multiple Vitamins-Minerals (CENTRUM SILVER PO) Take 1 tablet by mouth daily.      Marland Kitchen oxyCODONE-acetaminophen (PERCOCET/ROXICET) 5-325 MG per tablet Take 1-2 tablets by mouth every 4 (four) hours as needed.  30 tablet  0  . predniSONE (DELTASONE) 1 MG tablet Take 3 mg by mouth daily. Takes 3 tablets daily      . rosuvastatin (CRESTOR) 5 MG tablet Take 1 tablet (5 mg total) by mouth at bedtime.  30 tablet  11  . [DISCONTINUED] simvastatin (ZOCOR) 20 MG tablet Take 1 tablet (20 mg total) by mouth every evening.  30 tablet  6   No current facility-administered medications for this visit.     Past Medical History  Diagnosis Date  . Hypertension   . Rheumatoid arthritis     PT IS FOLLOWED BY DR Miachel Roux [facility]  . Hyperlipidemia   . Cataract     Bil  . H/O hiatal hernia   . Diverticulosis   . Orthostatic lightheadedness   . Palpitations   . Sinus bradycardia 02/04/2012  . Syncope and collapse 02/04/2012   "first time ever" (02/06/2012)  . History of bronchitis     "occasionally" (02/06/2012)  . Dyspnea on exertion     "off and on for years; just worse recently" (02/06/2012)  . Borderline diabetes   . GERD (gastroesophageal reflux disease)   . External hemorrhoids   . Guaiac + stool 2005    "went to Dr. Marina Goodell; took out polyps" (02/06/2012)  . Daily headache     "recently" (02/06/2012)  . Breast cancer 1996    BREAST CANCER -LEFT BREAST REMOVED -  1996    ROS:   All systems reviewed and negative except as noted in the HPI.   Past Surgical History  Procedure Laterality Date  . Mastectomy  1995    Left  . Cystectomy  1993    Left shoulder  . Colonoscopy  11/2011  . Polypectomy  ~2005; ~2008  . Tonsillectomy  1950's  . Vaginal hysterectomy  1970  . Cardiac catheterization  02/05/2012    "first one ever" (02/06/2012)     Family History  Problem Relation Age of Onset  . Cancer Mother     LEFT BREAST REMOVED  . Heart attack Mother   . Heart disease Mother   . Breast cancer Mother      History  Social History  . Marital Status: Widowed    Spouse Name: N/A    Number of Children: N/A  . Years of Education: N/A   Occupational History  . Not on file.   Social History Main Topics  . Smoking status: Current Every Day Smoker -- 0.50 packs/day for 50 years    Types: Cigarettes  . Smokeless tobacco: Never Used     Comment: 02/06/2012 'stopped smoking 4-5 times"; offered smoking cessation materials; pt declines  . Alcohol Use: Yes     Comment: 02/06/2012 "occasional; "like a holiday"  . Drug Use: No  . Sexually Active: No   Other Topics Concern  . Not on file   Social History Narrative  . No narrative on file     BP 155/75  Pulse 67  Ht 4\' 11"  (1.499 m)  Wt 138 lb 6.4 oz (62.778 kg)  BMI 27.94 kg/m2  Physical Exam:  Well appearing elderly woman,NAD HEENT: Unremarkable Neck:  No JVD, no thyromegally Back:  No CVA tenderness Lungs:  Clear with scattered  basilar rales, no wheezes, no rhonchi. HEART:  Regular rate rhythm, no murmurs, no rubs, no clicks Abd:  soft, positive bowel sounds, no organomegally, no rebound, no guarding Ext:  2 plus pulses, no edema, no cyanosis, no clubbing Skin:  No rashes no nodules Neuro:  CN II through XII intact, motor grossly intact  EKG - normal sinus rhythm with poor R-wave progression.  DEVICE  Normal device function.  See PaceArt for details.   Assess/Plan:

## 2012-05-18 NOTE — Assessment & Plan Note (Signed)
Her [employer] dual-chamber pacemaker is working normally. We'll plan to recheck in several months. 

## 2012-05-18 NOTE — Assessment & Plan Note (Signed)
Her blood pressure is elevated today. She has not missed any medications. She states that at home it is much better tolerated and controlled. I've asked her to follow her blood pressure and let us know or her primary care doctor know if it remains elevated.

## 2012-05-27 ENCOUNTER — Other Ambulatory Visit: Payer: Self-pay

## 2012-05-28 ENCOUNTER — Other Ambulatory Visit: Payer: Self-pay

## 2012-05-28 MED ORDER — METOPROLOL SUCCINATE ER 25 MG PO TB24: 25.0000 mg | ORAL_TABLET | Freq: Every day | ORAL | Status: DC

## 2012-06-11 ENCOUNTER — Encounter: Payer: Self-pay | Admitting: Internal Medicine

## 2012-06-24 ENCOUNTER — Other Ambulatory Visit: Payer: Self-pay | Admitting: Internal Medicine

## 2012-06-24 DIAGNOSIS — R109 Unspecified abdominal pain: Secondary | ICD-10-CM

## 2012-06-24 DIAGNOSIS — R1011 Right upper quadrant pain: Secondary | ICD-10-CM

## 2012-06-25 ENCOUNTER — Encounter: Payer: Self-pay | Admitting: Internal Medicine

## 2012-06-27 ENCOUNTER — Ambulatory Visit
Admission: RE | Admit: 2012-06-27 | Discharge: 2012-06-27 | Disposition: A | Discharge status: Home / Self Care | Payer: Medicare Other | Source: Ambulatory Visit | Attending: Internal Medicine | Admitting: Internal Medicine

## 2012-06-27 DIAGNOSIS — R109 Unspecified abdominal pain: Secondary | ICD-10-CM

## 2012-06-27 DIAGNOSIS — R1011 Right upper quadrant pain: Secondary | ICD-10-CM

## 2012-11-22 ENCOUNTER — Emergency Department (HOSPITAL_COMMUNITY)
Admission: EM | Admit: 2012-11-22 | Discharge: 2012-11-22 | Disposition: A | Discharge status: Home / Self Care | Payer: Medicare Other | Attending: Emergency Medicine | Admitting: Emergency Medicine

## 2012-11-22 ENCOUNTER — Emergency Department (HOSPITAL_COMMUNITY): Payer: Medicare Other

## 2012-11-22 ENCOUNTER — Encounter (HOSPITAL_COMMUNITY): Payer: Self-pay | Admitting: Emergency Medicine

## 2012-11-22 DIAGNOSIS — R071 Chest pain on breathing: Secondary | ICD-10-CM | POA: Insufficient documentation

## 2012-11-22 DIAGNOSIS — Z880 Allergy status to penicillin: Secondary | ICD-10-CM | POA: Insufficient documentation

## 2012-11-22 DIAGNOSIS — M069 Rheumatoid arthritis, unspecified: Secondary | ICD-10-CM | POA: Insufficient documentation

## 2012-11-22 DIAGNOSIS — E785 Hyperlipidemia, unspecified: Secondary | ICD-10-CM | POA: Insufficient documentation

## 2012-11-22 DIAGNOSIS — IMO0002 Reserved for concepts with insufficient information to code with codable children: Secondary | ICD-10-CM | POA: Insufficient documentation

## 2012-11-22 DIAGNOSIS — Y9389 Activity, other specified: Secondary | ICD-10-CM | POA: Insufficient documentation

## 2012-11-22 DIAGNOSIS — H269 Unspecified cataract: Secondary | ICD-10-CM | POA: Insufficient documentation

## 2012-11-22 DIAGNOSIS — Z8679 Personal history of other diseases of the circulatory system: Secondary | ICD-10-CM | POA: Insufficient documentation

## 2012-11-22 DIAGNOSIS — R0789 Other chest pain: Secondary | ICD-10-CM

## 2012-11-22 DIAGNOSIS — Z79899 Other long term (current) drug therapy: Secondary | ICD-10-CM | POA: Insufficient documentation

## 2012-11-22 DIAGNOSIS — F17200 Nicotine dependence, unspecified, uncomplicated: Secondary | ICD-10-CM | POA: Insufficient documentation

## 2012-11-22 DIAGNOSIS — I10 Essential (primary) hypertension: Secondary | ICD-10-CM | POA: Insufficient documentation

## 2012-11-22 DIAGNOSIS — Y92410 Unspecified street and highway as the place of occurrence of the external cause: Secondary | ICD-10-CM | POA: Insufficient documentation

## 2012-11-22 LAB — GLUCOSE, CAPILLARY: Glucose-Capillary: 77 mg/dL (ref 70–99)

## 2012-11-22 MED ORDER — HYDROCODONE-ACETAMINOPHEN 5-325 MG PO TABS: 1.0000 | ORAL_TABLET | ORAL | Status: DC | PRN

## 2012-11-22 NOTE — ED Notes (Signed)
CBG- 79 
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
## 2013-02-11 ENCOUNTER — Encounter: Payer: Self-pay | Admitting: Internal Medicine

## 2013-02-11 ENCOUNTER — Ambulatory Visit (INDEPENDENT_AMBULATORY_CARE_PROVIDER_SITE_OTHER): Payer: Medicare Other | Admitting: Internal Medicine

## 2013-02-11 VITALS — BP 151/77 | HR 70 | Ht 59.0 in | Wt 143.0 lb

## 2013-02-11 DIAGNOSIS — I10 Essential (primary) hypertension: Secondary | ICD-10-CM

## 2013-02-11 DIAGNOSIS — R079 Chest pain, unspecified: Secondary | ICD-10-CM

## 2013-02-11 DIAGNOSIS — I498 Other specified cardiac arrhythmias: Secondary | ICD-10-CM

## 2013-02-11 DIAGNOSIS — R001 Bradycardia, unspecified: Secondary | ICD-10-CM

## 2013-02-11 DIAGNOSIS — Z950 Presence of cardiac pacemaker: Secondary | ICD-10-CM

## 2013-02-11 LAB — MDC_IDC_ENUM_SESS_TYPE_INCLINIC
Lead Channel Pacing Threshold Amplitude: 0.8 V
Lead Channel Pacing Threshold Pulse Width: 0.4 ms
Lead Channel Sensing Intrinsic Amplitude: 19.3 mV
Lead Channel Setting Pacing Amplitude: 2 V
Lead Channel Setting Pacing Amplitude: 2.4 V
MDC IDC MSMT LEADCHNL RA IMPEDANCE VALUE: 605 Ohm
MDC IDC MSMT LEADCHNL RA PACING THRESHOLD AMPLITUDE: 1 V
MDC IDC MSMT LEADCHNL RA PACING THRESHOLD PULSEWIDTH: 0.4 ms
MDC IDC MSMT LEADCHNL RA SENSING INTR AMPL: 3.8 mV
MDC IDC MSMT LEADCHNL RV IMPEDANCE VALUE: 601 Ohm
MDC IDC PG SERIAL: 111372
MDC IDC SESS DTM: 20150120050000
MDC IDC SET LEADCHNL RV PACING PULSEWIDTH: 0.4 ms
MDC IDC SET LEADCHNL RV SENSING SENSITIVITY: 2.5 mV
MDC IDC SET ZONE DETECTION INTERVAL: 375 ms

## 2013-02-11 NOTE — Assessment & Plan Note (Signed)
Her [employer] dual-chamber pacemaker is working normally. We'll plan to recheck in several months. 

## 2013-02-11 NOTE — Assessment & Plan Note (Signed)
Her chest discomfort has been quiet. I've encouraged her to increase her physical activity, as tolerated.

## 2013-02-11 NOTE — Assessment & Plan Note (Signed)
Her blood pressure is elevated today but she notes that she did not take her medicines this morning. For the most part, it has been recently well controlled.

## 2013-02-11 NOTE — Patient Instructions (Signed)
Your physician wants you to follow-up in: 6 months in the device clinic and 12 months with Dr Knox Saliva will receive a reminder letter in the mail two months in advance. If you don't receive a letter, please call our office to schedule the follow-up appointment.

## 2013-02-11 NOTE — Progress Notes (Signed)
HPI Carol Bright returns today for followup. She is a pleasant 80 yo woman with symptomatic bradycardia, syncope, status post pacemaker insertion, hypertension, and rheumatoid arthritis. In the interim, she has been stable. She denies syncope, chest pain, or shortness of breath. Her arthritis has been fairly well controlled. She has occasional dizzy spells.  Allergies  Allergen Reactions  . Penicillins Hives     Current Outpatient Prescriptions  Medication Sig Dispense Refill  . amLODipine (NORVASC) 2.5 MG tablet Take 1 tablet (2.5 mg total) by mouth daily.  30 tablet  6  . Calcium Citrate (CITRACAL PO) Take 1 tablet by mouth daily.       . cetirizine (ZYRTEC) 10 MG tablet Take 10 mg by mouth daily as needed for allergies.      Marland Kitchen docusate sodium 100 MG CAPS Take 100 mg by mouth daily.  10 capsule    . famotidine (PEPCID AC) 10 MG chewable tablet Chew 10 mg by mouth 2 (two) times daily.      . folic acid (FOLVITE) 1 MG tablet Take 2 mg by mouth daily.      . methotrexate (RHEUMATREX) 2.5 MG tablet Take 25 mg by mouth once a week. Takes 10 tablets on Sunday. Caution:Chemotherapy. Protect from light.      . Multiple Vitamins-Minerals (CENTRUM SILVER PO) Take 1 tablet by mouth daily.      . predniSONE (DELTASONE) 1 MG tablet Take 3 mg by mouth daily. Takes 3 tablets daily      . rosuvastatin (CRESTOR) 5 MG tablet Take 1 tablet (5 mg total) by mouth at bedtime.  30 tablet  11  . Vitamin D, Ergocalciferol, (DRISDOL) 50000 UNITS CAPS capsule Take 50,000 Units by mouth every 7 (seven) days. Sunday.      . [DISCONTINUED] simvastatin (ZOCOR) 20 MG tablet Take 1 tablet (20 mg total) by mouth every evening.  30 tablet  6   No current facility-administered medications for this visit.     Past Medical History  Diagnosis Date  . Hypertension   . Rheumatoid arthritis(714.0)     PT IS FOLLOWED BY DR [place]  . Hyperlipidemia   . Cataract     Bil  . H/O hiatal hernia   .  Diverticulosis   . Orthostatic lightheadedness   . Palpitations   . Sinus bradycardia 02/04/2012  . Syncope and collapse 02/04/2012    "first time ever" (02/06/2012)  . History of bronchitis     "occasionally" (02/06/2012)  . Dyspnea on exertion     "off and on for years; just worse recently" (02/06/2012)  . Borderline diabetes   . GERD (gastroesophageal reflux disease)   . External hemorrhoids   . Guaiac + stool 2005    "went to Dr. Henrene Pastor; took out polyps" (02/06/2012)  . Daily headache     "recently" (02/06/2012)  . Breast cancer 1996    BREAST CANCER -LEFT BREAST REMOVED -  1996    ROS:   All systems reviewed and negative except as noted in the HPI.   Past Surgical History  Procedure Laterality Date  . Mastectomy  1995    Left  . Cystectomy  1993    Left shoulder  . Colonoscopy  11/2011  . Polypectomy  ~2005; ~2008  . Tonsillectomy  1950's  . Vaginal hysterectomy  1970  . Cardiac catheterization  02/05/2012    "first one ever" (02/06/2012)     Family History  Problem Relation Age of Onset  . Cancer  Mother     LEFT BREAST REMOVED  . Heart attack Mother   . Heart disease Mother   . Breast cancer Mother      History   Social History  . Marital Status: Widowed    Spouse Name: N/A    Number of Children: N/A  . Years of Education: N/A   Occupational History  . Not on file.   Social History Main Topics  . Smoking status: Current Every Day Smoker -- 0.50 packs/day for 50 years    Types: Cigarettes  . Smokeless tobacco: Never Used     Comment: 02/06/2012 'stopped smoking 4-5 times"; offered smoking cessation materials; pt declines  . Alcohol Use: Yes     Comment: 02/06/2012 "occasional; "like a holiday"  . Drug Use: No  . Sexual Activity: No   Other Topics Concern  . Not on file   Social History Narrative  . No narrative on file     BP 151/77  Pulse 70  Ht 4\' 11"  (1.499 m)  Wt 143 lb (64.864 kg)  BMI 28.87 kg/m2  Physical Exam:  Well appearing  elderly woman,NAD HEENT: Unremarkable Neck:  No JVD, no thyromegally Back:  No CVA tenderness Lungs:  Clear with scattered basilar rales, no wheezes, no rhonchi. HEART:  Regular rate rhythm, no murmurs, no rubs, no clicks Abd:  soft, positive bowel sounds, no organomegally, no rebound, no guarding Ext:  2 plus pulses, no edema, no cyanosis, no clubbing Skin:  No rashes no nodules Neuro:  CN II through XII intact, motor grossly intact  EKG - normal sinus rhythm with poor R-wave progression.  DEVICE  Normal device function.  See PaceArt for details.   Assess/Plan:

## 2013-03-15 ENCOUNTER — Encounter (HOSPITAL_COMMUNITY): Payer: Self-pay | Admitting: Emergency Medicine

## 2013-03-15 ENCOUNTER — Inpatient Hospital Stay (HOSPITAL_COMMUNITY)
Admission: EM | Admit: 2013-03-15 | Discharge: 2013-03-17 | DRG: 069 | Disposition: A | Discharge status: Home / Self Care | Payer: Medicare Other | Attending: Internal Medicine | Admitting: Internal Medicine

## 2013-03-15 ENCOUNTER — Emergency Department (HOSPITAL_COMMUNITY): Payer: Medicare Other

## 2013-03-15 DIAGNOSIS — I10 Essential (primary) hypertension: Secondary | ICD-10-CM | POA: Diagnosis present

## 2013-03-15 DIAGNOSIS — A5903 Trichomonal cystitis and urethritis: Secondary | ICD-10-CM

## 2013-03-15 DIAGNOSIS — R1310 Dysphagia, unspecified: Secondary | ICD-10-CM | POA: Diagnosis present

## 2013-03-15 DIAGNOSIS — IMO0002 Reserved for concepts with insufficient information to code with codable children: Secondary | ICD-10-CM

## 2013-03-15 DIAGNOSIS — E785 Hyperlipidemia, unspecified: Secondary | ICD-10-CM | POA: Diagnosis present

## 2013-03-15 DIAGNOSIS — I25810 Atherosclerosis of coronary artery bypass graft(s) without angina pectoris: Secondary | ICD-10-CM | POA: Diagnosis present

## 2013-03-15 DIAGNOSIS — R079 Chest pain, unspecified: Secondary | ICD-10-CM

## 2013-03-15 DIAGNOSIS — Z950 Presence of cardiac pacemaker: Secondary | ICD-10-CM

## 2013-03-15 DIAGNOSIS — Z853 Personal history of malignant neoplasm of breast: Secondary | ICD-10-CM

## 2013-03-15 DIAGNOSIS — R002 Palpitations: Secondary | ICD-10-CM

## 2013-03-15 DIAGNOSIS — R1314 Dysphagia, pharyngoesophageal phase: Secondary | ICD-10-CM | POA: Diagnosis present

## 2013-03-15 DIAGNOSIS — R55 Syncope and collapse: Secondary | ICD-10-CM

## 2013-03-15 DIAGNOSIS — K219 Gastro-esophageal reflux disease without esophagitis: Secondary | ICD-10-CM | POA: Diagnosis present

## 2013-03-15 DIAGNOSIS — Z720 Tobacco use: Secondary | ICD-10-CM

## 2013-03-15 DIAGNOSIS — R0789 Other chest pain: Secondary | ICD-10-CM | POA: Diagnosis present

## 2013-03-15 DIAGNOSIS — R0609 Other forms of dyspnea: Secondary | ICD-10-CM

## 2013-03-15 DIAGNOSIS — R4182 Altered mental status, unspecified: Secondary | ICD-10-CM | POA: Diagnosis present

## 2013-03-15 DIAGNOSIS — F17200 Nicotine dependence, unspecified, uncomplicated: Secondary | ICD-10-CM | POA: Diagnosis present

## 2013-03-15 DIAGNOSIS — R42 Dizziness and giddiness: Secondary | ICD-10-CM

## 2013-03-15 DIAGNOSIS — R531 Weakness: Secondary | ICD-10-CM

## 2013-03-15 DIAGNOSIS — M069 Rheumatoid arthritis, unspecified: Secondary | ICD-10-CM | POA: Diagnosis present

## 2013-03-15 DIAGNOSIS — I498 Other specified cardiac arrhythmias: Secondary | ICD-10-CM | POA: Diagnosis present

## 2013-03-15 DIAGNOSIS — R001 Bradycardia, unspecified: Secondary | ICD-10-CM

## 2013-03-15 DIAGNOSIS — G459 Transient cerebral ischemic attack, unspecified: Principal | ICD-10-CM | POA: Diagnosis present

## 2013-03-15 LAB — CBC
HEMATOCRIT: 35.1 % — AB (ref 36.0–46.0)
HEMOGLOBIN: 11.7 g/dL — AB (ref 12.0–15.0)
MCH: 30.3 pg (ref 26.0–34.0)
MCHC: 33.3 g/dL (ref 30.0–36.0)
MCV: 90.9 fL (ref 78.0–100.0)
Platelets: 303 10*3/uL (ref 150–400)
RBC: 3.86 MIL/uL — AB (ref 3.87–5.11)
RDW: 14.2 % (ref 11.5–15.5)
WBC: 11.8 10*3/uL — AB (ref 4.0–10.5)

## 2013-03-15 LAB — HEPATIC FUNCTION PANEL
ALT: 21 U/L (ref 0–35)
AST: 66 U/L — ABNORMAL HIGH (ref 0–37)
Albumin: 3.6 g/dL (ref 3.5–5.2)
Alkaline Phosphatase: 81 U/L (ref 39–117)
BILIRUBIN DIRECT: 0.3 mg/dL (ref 0.0–0.3)
Indirect Bilirubin: 0.1 mg/dL — ABNORMAL LOW (ref 0.3–0.9)
Total Bilirubin: 0.4 mg/dL (ref 0.3–1.2)
Total Protein: 8.4 g/dL — ABNORMAL HIGH (ref 6.0–8.3)

## 2013-03-15 LAB — URINE MICROSCOPIC-ADD ON

## 2013-03-15 LAB — BASIC METABOLIC PANEL
BUN: 11 mg/dL (ref 6–23)
CALCIUM: 9 mg/dL (ref 8.4–10.5)
CHLORIDE: 105 meq/L (ref 96–112)
CO2: 23 meq/L (ref 19–32)
CREATININE: 0.83 mg/dL (ref 0.50–1.10)
GFR calc Af Amer: 75 mL/min — ABNORMAL LOW (ref >90)
GFR calc non Af Amer: 64 mL/min — ABNORMAL LOW (ref >90)
GLUCOSE: 92 mg/dL (ref 70–99)
Potassium: 4.4 mEq/L (ref 3.7–5.3)
Sodium: 142 mEq/L (ref 137–147)

## 2013-03-15 LAB — URINALYSIS, ROUTINE W REFLEX MICROSCOPIC
Bilirubin Urine: NEGATIVE
GLUCOSE, UA: NEGATIVE mg/dL
HGB URINE DIPSTICK: NEGATIVE
Ketones, ur: NEGATIVE mg/dL
Nitrite: NEGATIVE
PROTEIN: NEGATIVE mg/dL
SPECIFIC GRAVITY, URINE: 1.021 (ref 1.005–1.030)
UROBILINOGEN UA: 0.2 mg/dL (ref 0.0–1.0)
pH: 5 (ref 5.0–8.0)

## 2013-03-15 LAB — I-STAT TROPONIN, ED: TROPONIN I, POC: 0 ng/mL (ref 0.00–0.08)

## 2013-03-15 LAB — CBG MONITORING, ED: Glucose-Capillary: 84 mg/dL (ref 70–99)

## 2013-03-15 LAB — TROPONIN I: Troponin I: <0.3 ng/mL (ref <0.30)

## 2013-03-15 LAB — PRO B NATRIURETIC PEPTIDE: PRO B NATRI PEPTIDE: 27.3 pg/mL (ref 0–450)

## 2013-03-15 LAB — LIPASE, BLOOD: LIPASE: 32 U/L (ref 11–59)

## 2013-03-15 LAB — D-DIMER, QUANTITATIVE (NOT AT ARMC): D DIMER QUANT: 0.72 ug{FEU}/mL — AB (ref 0.00–0.48)

## 2013-03-15 MED ORDER — IOHEXOL 350 MG/ML SOLN
80.0000 mL | Freq: Once | INTRAVENOUS | Status: AC | PRN
  Administered 2013-03-15: 80 mL via INTRAVENOUS

## 2013-03-15 MED ORDER — ASPIRIN EC 325 MG PO TBEC: 325.0000 mg | DELAYED_RELEASE_TABLET | Freq: Every day | ORAL | Status: DC

## 2013-03-15 MED ORDER — ALUM & MAG HYDROXIDE-SIMETH 200-200-20 MG/5ML PO SUSP: 30.0000 mL | Freq: Four times a day (QID) | ORAL | Status: DC | PRN

## 2013-03-15 MED ORDER — SODIUM CHLORIDE 0.9 % IJ SOLN
3.0000 mL | Freq: Two times a day (BID) | INTRAMUSCULAR | Status: DC
  Administered 2013-03-15: 3 mL via INTRAVENOUS

## 2013-03-15 MED ORDER — ACETAMINOPHEN 650 MG RE SUPP: 650.0000 mg | Freq: Four times a day (QID) | RECTAL | Status: DC | PRN

## 2013-03-15 MED ORDER — MORPHINE SULFATE 4 MG/ML IJ SOLN
4.0000 mg | Freq: Once | INTRAMUSCULAR | Status: AC
Start: 2013-03-15 — End: 2013-03-15
  Administered 2013-03-15: 4 mg via INTRAVENOUS
  Filled 2013-03-15: qty 1

## 2013-03-15 MED ORDER — AMLODIPINE BESYLATE 5 MG PO TABS
5.0000 mg | ORAL_TABLET | Freq: Every day | ORAL | Status: DC
  Filled 2013-03-15: qty 1

## 2013-03-15 MED ORDER — ACETAMINOPHEN 325 MG PO TABS: 650.0000 mg | ORAL_TABLET | Freq: Four times a day (QID) | ORAL | Status: DC | PRN

## 2013-03-15 MED ORDER — ASPIRIN EC 325 MG PO TBEC
325.0000 mg | DELAYED_RELEASE_TABLET | Freq: Every day | ORAL | Status: DC
  Administered 2013-03-15 – 2013-03-17 (×3): 325 mg via ORAL
  Filled 2013-03-15 (×3): qty 1

## 2013-03-15 MED ORDER — LEVETIRACETAM 500 MG PO TABS
500.0000 mg | ORAL_TABLET | Freq: Two times a day (BID) | ORAL | Status: DC
  Administered 2013-03-16: 500 mg via ORAL
  Filled 2013-03-15 (×3): qty 1

## 2013-03-15 MED ORDER — FOLIC ACID 1 MG PO TABS
2.0000 mg | ORAL_TABLET | Freq: Every day | ORAL | Status: DC
  Administered 2013-03-15 – 2013-03-17 (×3): 2 mg via ORAL
  Filled 2013-03-15 (×3): qty 2

## 2013-03-15 MED ORDER — HYDROMORPHONE HCL PF 1 MG/ML IJ SOLN: 1.0000 mg | INTRAMUSCULAR | Status: DC | PRN

## 2013-03-15 MED ORDER — FAMOTIDINE 10 MG PO TABS
10.0000 mg | ORAL_TABLET | Freq: Every day | ORAL | Status: DC
  Administered 2013-03-16 – 2013-03-17 (×2): 10 mg via ORAL
  Filled 2013-03-15 (×2): qty 1

## 2013-03-15 MED ORDER — ONDANSETRON HCL 4 MG/2ML IJ SOLN
4.0000 mg | Freq: Once | INTRAMUSCULAR | Status: AC
  Administered 2013-03-15: 4 mg via INTRAVENOUS
  Filled 2013-03-15: qty 2

## 2013-03-15 MED ORDER — HYDROCODONE-ACETAMINOPHEN 5-325 MG PO TABS
1.0000 | ORAL_TABLET | ORAL | Status: DC | PRN
  Administered 2013-03-16: 1 via ORAL
  Administered 2013-03-16: 2 via ORAL
  Filled 2013-03-15: qty 2
  Filled 2013-03-15: qty 1

## 2013-03-15 MED ORDER — POTASSIUM CHLORIDE IN NACL 20-0.9 MEQ/L-% IV SOLN
INTRAVENOUS | Status: DC
  Administered 2013-03-16: 1000 mL via INTRAVENOUS
  Filled 2013-03-15 (×4): qty 1000

## 2013-03-15 MED ORDER — SODIUM CHLORIDE 0.9 % IV SOLN
1000.0000 mg | Freq: Once | INTRAVENOUS | Status: AC
  Administered 2013-03-15: 1000 mg via INTRAVENOUS
  Filled 2013-03-15: qty 10

## 2013-03-15 MED ORDER — PROMETHAZINE HCL 12.5 MG PO TABS: 12.5000 mg | ORAL_TABLET | Freq: Four times a day (QID) | ORAL | Status: DC | PRN

## 2013-03-15 MED ORDER — ASPIRIN 300 MG RE SUPP
300.0000 mg | Freq: Every day | RECTAL | Status: DC
  Filled 2013-03-15 (×2): qty 1

## 2013-03-15 MED ORDER — METOPROLOL TARTRATE 1 MG/ML IV SOLN
2.5000 mg | Freq: Three times a day (TID) | INTRAVENOUS | Status: DC
  Administered 2013-03-15 – 2013-03-16 (×2): 2.5 mg via INTRAVENOUS
  Filled 2013-03-15 (×5): qty 5

## 2013-03-15 MED ORDER — DOCUSATE SODIUM 100 MG PO CAPS
100.0000 mg | ORAL_CAPSULE | Freq: Every day | ORAL | Status: DC
  Administered 2013-03-15 – 2013-03-17 (×3): 100 mg via ORAL
  Filled 2013-03-15 (×3): qty 1

## 2013-03-15 MED ORDER — LEVETIRACETAM 500 MG PO TABS: 500.0000 mg | ORAL_TABLET | Freq: Two times a day (BID) | ORAL | Status: DC

## 2013-03-15 MED ORDER — LORATADINE 10 MG PO TABS
10.0000 mg | ORAL_TABLET | Freq: Every day | ORAL | Status: DC
  Administered 2013-03-16 – 2013-03-17 (×2): 10 mg via ORAL
  Filled 2013-03-15 (×2): qty 1

## 2013-03-15 MED ORDER — SODIUM CHLORIDE 0.9 % IV SOLN
1000.0000 mg | Freq: Once | INTRAVENOUS | Status: DC
  Filled 2013-03-15: qty 10

## 2013-03-15 MED ORDER — PREDNISONE 20 MG PO TABS
20.0000 mg | ORAL_TABLET | Freq: Every day | ORAL | Status: DC
  Administered 2013-03-15 – 2013-03-17 (×2): 20 mg via ORAL
  Filled 2013-03-15 (×3): qty 1

## 2013-03-15 MED ORDER — BISACODYL 5 MG PO TBEC
5.0000 mg | DELAYED_RELEASE_TABLET | Freq: Every day | ORAL | Status: DC | PRN
  Filled 2013-03-15: qty 1

## 2013-03-15 MED ORDER — METOPROLOL TARTRATE 12.5 MG HALF TABLET
12.5000 mg | ORAL_TABLET | Freq: Once | ORAL | Status: DC
  Filled 2013-03-15: qty 1

## 2013-03-15 MED ORDER — ATORVASTATIN CALCIUM 10 MG PO TABS
10.0000 mg | ORAL_TABLET | Freq: Every day | ORAL | Status: DC
  Administered 2013-03-15 – 2013-03-17 (×3): 10 mg via ORAL
  Filled 2013-03-15 (×3): qty 1

## 2013-03-15 MED ORDER — HYDROMORPHONE HCL PF 1 MG/ML IJ SOLN
0.5000 mg | INTRAMUSCULAR | Status: DC | PRN
  Administered 2013-03-16: 0.5 mg via INTRAVENOUS
  Filled 2013-03-15: qty 1

## 2013-03-15 MED ORDER — ENOXAPARIN SODIUM 40 MG/0.4ML ~~LOC~~ SOLN
40.0000 mg | SUBCUTANEOUS | Status: DC
  Administered 2013-03-15 – 2013-03-16 (×2): 40 mg via SUBCUTANEOUS
  Filled 2013-03-15 (×3): qty 0.4

## 2013-03-15 MED ORDER — METHOTREXATE 2.5 MG PO TABS
25.0000 mg | ORAL_TABLET | ORAL | Status: DC
  Administered 2013-03-16: 25 mg via ORAL
  Filled 2013-03-15: qty 10

## 2013-03-15 MED ORDER — AMLODIPINE BESYLATE 2.5 MG PO TABS
2.5000 mg | ORAL_TABLET | Freq: Every day | ORAL | Status: DC
  Administered 2013-03-15 – 2013-03-17 (×3): 2.5 mg via ORAL
  Filled 2013-03-15 (×3): qty 1

## 2013-03-15 NOTE — Code Documentation (Signed)
Code stroke called at 1610.  Patient arrived to [facility] ED this am with c/o left side chest pain which radiates down and around the back.  Patient came back from xray and received some morphine and had some heaving and developed a facial droop, left side drift and aphasia.  AS per Rn and family, patient also dazed off staring for a few minutes where she was unable to communicate with them.  This was noticed at 1450.  NIHSS 1.

## 2013-03-15 NOTE — ED Provider Notes (Signed)
CSN: [id number]     Arrival date & time 03/15/13  1000 History   First MD Initiated Contact with Patient 03/15/13 1051     Chief Complaint  Patient presents with  . Chest Pain    (Consider location/radiation/quality/duration/timing/severity/associated sxs/prior Treatment) HPI Comments: Patient is an 80 year old female with a history of hypertension, hyperlipidemia, sinus bradycardia with syncope and collapse (s/p pacemaker placement) and breast cancer who presents to the emergency department for chest pain. Patient states her chest pain is left-sided and sharp in nature. Pain is intermittent and without modifying factors. Patient states that the pain lasts for "minutes" and radiates from her left anterior chest down to her left lower abdomen, and around to her back. Patient thought she was developing bronchitis which she had last month, so she took Robitussin for symptoms. Patient also tried increasing her daily prednisone dosage for her symptoms without relief. Patient denies associated fever, syncope or near syncope, cough or hemoptysis, shortness of breath, nausea or vomiting, melena or hematochezia, urinary symptoms, numbness/tingling, and weakness. Patient denies a history of ACS, DVT, PE, and coagulopathies. Patient denies the use of blood thinners. Pacemaker physician - Dr. Lovena Le. Cardiologist - Dr. Harrington Challenger.  Patient denies pain at present.  The history is provided by the patient. No language interpreter was used.    Past Medical History  Diagnosis Date  . Hypertension   . Rheumatoid arthritis(714.0)     PT IS FOLLOWED BY DR [place]  . Hyperlipidemia   . Cataract     Bil  . H/O hiatal hernia   . Diverticulosis   . Orthostatic lightheadedness   . Palpitations   . Sinus bradycardia 02/04/2012  . Syncope and collapse 02/04/2012    "first time ever" (02/06/2012)  . History of bronchitis     "occasionally" (02/06/2012)  . Dyspnea on exertion     "off and on for  years; just worse recently" (02/06/2012)  . Borderline diabetes   . GERD (gastroesophageal reflux disease)   . External hemorrhoids   . Guaiac + stool 2005    "went to Dr. Henrene Pastor; took out polyps" (02/06/2012)  . Daily headache     "recently" (02/06/2012)  . Breast cancer 1996    BREAST CANCER -LEFT BREAST REMOVED -  1996   Past Surgical History  Procedure Laterality Date  . Mastectomy  1995    Left  . Cystectomy  1993    Left shoulder  . Colonoscopy  11/2011  . Polypectomy  ~2005; ~2008  . Tonsillectomy  1950's  . Vaginal hysterectomy  1970  . Cardiac catheterization  02/05/2012    "first one ever" (02/06/2012)   Family History  Problem Relation Age of Onset  . Cancer Mother     LEFT BREAST REMOVED  . Heart attack Mother   . Heart disease Mother   . Breast cancer Mother    History  Substance Use Topics  . Smoking status: Current Every Day Smoker -- 0.50 packs/day for 50 years    Types: Cigarettes  . Smokeless tobacco: Never Used     Comment: 02/06/2012 'stopped smoking 4-5 times"; offered smoking cessation materials; pt declines  . Alcohol Use: Yes     Comment: 02/06/2012 "occasional; "like a holiday"   OB History   Grav Para Term Preterm Abortions TAB SAB Ect Mult Living                 Review of Systems  Cardiovascular: Positive for chest pain.  All other  systems reviewed and are negative.     Allergies  Penicillins  Home Medications   Current Outpatient Rx  Name  Route  Sig  Dispense  Refill  . amLODipine (NORVASC) 2.5 MG tablet   Oral   Take 1 tablet (2.5 mg total) by mouth daily.   30 tablet   6   . Calcium Citrate (CITRACAL PO)   Oral   Take 1 tablet by mouth daily.          . cetirizine (ZYRTEC) 10 MG tablet   Oral   Take 10 mg by mouth daily as needed for allergies.         Marland Kitchen docusate sodium 100 MG CAPS   Oral   Take 100 mg by mouth daily.   10 capsule      . famotidine (PEPCID AC) 10 MG chewable tablet   Oral   Chew 10 mg by  mouth daily.          . folic acid (FOLVITE) 1 MG tablet   Oral   Take 2 mg by mouth daily.         . methotrexate (RHEUMATREX) 2.5 MG tablet   Oral   Take 25 mg by mouth once a week. Takes 10 tablets on Sunday. Caution:Chemotherapy. Protect from light.         . Multiple Vitamins-Minerals (CENTRUM SILVER PO)   Oral   Take 1 tablet by mouth daily.         . predniSONE (DELTASONE) 1 MG tablet   Oral   Take 3 mg by mouth daily. Takes 3 tablets daily         . rosuvastatin (CRESTOR) 5 MG tablet   Oral   Take 1 tablet (5 mg total) by mouth at bedtime.   30 tablet   11   . Vitamin D, Ergocalciferol, (DRISDOL) 50000 UNITS CAPS capsule   Oral   Take 50,000 Units by mouth every 7 (seven) days. Sunday.          BP 170/92  Pulse 60  Temp(Src) 98.1 F (36.7 C) (Oral)  Resp 22  Ht 4\' 11"  (1.499 m)  Wt 145 lb (65.772 kg)  BMI 29.27 kg/m2  SpO2 99%  Physical Exam  Nursing note and vitals reviewed. Constitutional: She is oriented to person, place, and time. She appears well-developed and well-nourished. No distress.  HENT:  Head: Normocephalic and atraumatic.  Mouth/Throat: Oropharynx is clear and moist. No oropharyngeal exudate.  Eyes: Conjunctivae and EOM are normal. Pupils are equal, round, and reactive to light. No scleral icterus.  Neck: Normal range of motion. Neck supple.  Cardiovascular: Normal rate, regular rhythm and normal heart sounds.   Pulmonary/Chest: Effort normal and breath sounds normal. No respiratory distress. She has no wheezes. She has no rales. She exhibits no tenderness.  No retractions or accessory muscle use. Symmetric chest expansion. No wheezes or rales.  Abdominal: Soft. She exhibits no mass. There is no tenderness. There is no rebound and no guarding.  Musculoskeletal: Normal range of motion.  Neurological: She is alert and oriented to person, place, and time.  GCS 15. Speech is goal oriented. Patient moves extremities without ataxia.   Skin: Skin is warm and dry. No rash noted. She is not diaphoretic. No erythema. No pallor.  Psychiatric: She has a normal mood and affect. Her behavior is normal.    ED Course  Procedures (including critical care time) Labs Review Labs Reviewed  CBC - Abnormal; Notable for the  following:    WBC 11.8 (*)    RBC 3.86 (*)    Hemoglobin 11.7 (*)    HCT 35.1 (*)    All other components within normal limits  BASIC METABOLIC PANEL - Abnormal; Notable for the following:    GFR calc non Af Amer 64 (*)    GFR calc Af Amer 75 (*)    All other components within normal limits  URINALYSIS, ROUTINE W REFLEX MICROSCOPIC - Abnormal; Notable for the following:    APPearance CLOUDY (*)    Leukocytes, UA LARGE (*)    All other components within normal limits  HEPATIC FUNCTION PANEL - Abnormal; Notable for the following:    Total Protein 8.4 (*)    AST 66 (*)    Indirect Bilirubin 0.1 (*)    All other components within normal limits  URINE MICROSCOPIC-ADD ON - Abnormal; Notable for the following:    Squamous Epithelial / LPF MANY (*)    All other components within normal limits  D-DIMER, QUANTITATIVE - Abnormal; Notable for the following:    D-Dimer, Quant 0.72 (*)    All other components within normal limits  PRO B NATRIURETIC PEPTIDE  LIPASE, BLOOD  I-STAT TROPOININ, ED  CBG MONITORING, ED   Imaging Review Ct Abdomen Pelvis Wo Contrast  03/15/2013   CLINICAL DATA:  Flank pain.  EXAM: CT ABDOMEN AND PELVIS WITHOUT CONTRAST  TECHNIQUE: Multidetector CT imaging of the abdomen and pelvis was performed following the standard protocol without intravenous contrast.  COMPARISON:  US RENAL dated 06/27/2012; CT ABD/PELVIS W CM dated 02/04/2012  FINDINGS: Liver normal. Spleen normal. Pancreas normal. No biliary distention.  Adrenals normal. 1.6 cm simple cyst anterior aspect upper pole right kidney. No hydronephrosis or obstructing ureteral stone. Bladder nondistended. Innumerable pelvic phleboliths.  Hysterectomy. No pelvic masses. No free pelvic fluid collections.  No significant adenopathy. Atherosclerotic vascular calcification noted of the aorta and visceral vessels. No aneurysm.  Appendix normal. Prominent prominent sigmoid colonic diverticulosis is noted with mild adjacent fat streaking suggesting mild diverticulitis. No evidence of bowel distention. No free air. No mesenteric masses. Tiny umbilical hernia with herniation of fat only.  Mild bibasilar atelectasis. Cardiomegaly. Small pericardial effusion. Cardiac pacer. Diffuse degenerative changes lumbar spine and both hips.  IMPRESSION: 1. Sigmoid colonic diverticulosis with mild associated changes of diverticulitis .  2.  Small pericardial effusion.   Electronically Signed   By: Marcello Moores  Register   On: 03/15/2013 16:26   Dg Chest 2 View  03/15/2013   CLINICAL DATA:  Chest pain and shortness of breath.  EXAM: CHEST  2 VIEW  COMPARISON:  11/22/2012  FINDINGS: Cardiomegaly is stable. No evidence of acute infiltrate or pulmonary edema. No evidence of pleural effusion. No mass or lymphadenopathy identified. Dual lead transvenous pacemaker remains in appropriate position. Thoracic spine degenerative changes again noted.  IMPRESSION: Stable cardiomegaly.  No active lung disease.   Electronically Signed   By: Earle Gell M.D.   On: 03/15/2013 12:48   Ct Head Wo Contrast  03/15/2013   CLINICAL DATA:  Code stroke, left-sided facial paralysis, left-sided weakness, right side eye pain with blurred vision, tongue feels cleared, history hypertension, breast cancer, hyperlipidemia  EXAM: CT HEAD WITHOUT CONTRAST  TECHNIQUE: Contiguous axial images were obtained from the base of the skull through the vertex without intravenous contrast.  COMPARISON:  02/04/2012  FINDINGS: Opacified intracranial vascular structures from preceding CTA chest.  Normal ventricular morphology.  No midline shift or mass effect.  Mild  small vessel chronic ischemic changes of deep cerebral  white matter.  No intracranial hemorrhage, mass lesion or evidence acute infarction.  No extra-axial fluid collections.  Bones and sinuses unremarkable.  IMPRESSION: Mild small vessel chronic ischemic changes of deep cerebral white matter.  No acute intracranial abnormalities.  Critical Value/emergent results were called by telephone at the time of interpretation on 03/15/2013 at 4:30 PM to Dr. Leonel Ramsay, who verbally acknowledged these results.   Electronically Signed   By: Lavonia Dana M.D.   On: 03/15/2013 16:34   Ct Angio Chest Pe W/cm &/or Wo Cm  03/15/2013   CLINICAL DATA:  Breast cancer  EXAM: CT ANGIOGRAPHY CHEST WITH CONTRAST  TECHNIQUE: Multidetector CT imaging of the chest was performed using the standard protocol during bolus administration of intravenous contrast. Multiplanar CT image reconstructions and MIPs were obtained to evaluate the vascular anatomy.  CONTRAST:  6mL OMNIPAQUE IOHEXOL 350 MG/ML SOLN  COMPARISON:  None.  FINDINGS: There are no filling defects in the pulmonary arterial tree to suggest acute pulmonary thromboembolism.  Small pericardial effusion is present. Small scattered mediastinal nodes. Calcified mediastinal and hilar nodes are present as well. Right subclavian pacemaker device is in place.  Three vessel coronary artery calcifications. Small single left main coronary artery calcification.  No pneumothorax.  No pleural effusion.  Dependent atelectasis. No definite mass or consolidation. Subtle patchy ground-glass opacities in the right middle lobe are nonspecific.  No acute bony deformity.  Review of the MIP images confirms the above findings.  IMPRESSION: No evidence of acute pulmonary thromboembolism.  Small pericardial effusion.   Electronically Signed   By: Maryclare Bean M.D.   On: 03/15/2013 16:12    EKG Interpretation    Date/Time:  Saturday March 15 2013 10:04:50 EST Ventricular Rate:  65 PR Interval:  222 QRS Duration: 86 QT Interval:  378 QTC  Calculation: 393 R Axis:   74 Text Interpretation:  Sinus rhythm with 1st degree A-V block Otherwise normal ECG NO SIGNIFICANT CHANGE SINCE LAST TRACING YESTERDAY Confirmed by DOCHERTY  MD, MEGAN ([id number]) on 03/15/2013 10:13:52 AM            MDM   Final diagnoses:  Chest pain  Weakness  Trichomonal urethritis    80 year old female presents to the emergency department for intermittent chest pain x5 days. Patient radiates from left anterior chest down to left lower abdomen and around to the back. Patient denies modifying factors of her symptoms and describes the pain as sharp in nature.  On arrival patient is well and nontoxic appearing, hemodynamically stable, and afebrile. On initial presentation with the patient, she denies pain. ACS workup today is unremarkable. Patient evaluated by Dr. Tawnya Crook who appreciated some palpable tenderness in the posterior left chest. Given intermittent symptoms, d-dimer was ordered to evaluate for pulmonary embolism. CT abdomen and pelvis without contrast was also ordered to evaluate for intra-abdominal processes and possible kidney stone despite lack of hematuria. D-dimer was elevated and CT angio chest ordered.  After returning from CT scan, patient noted to have worsening pain in her chest. Morphine and Zofran order for symptoms. Shortly after, nurse appreciated a change in patient's demeanor and stated that it seemed as though the patient "glared into space". Nurse then appreciated a facial drooping of the left side with decreased left grip strength. Code stroke called and patient evaluated by Dr. Tawnya Crook. On Dr. Farrel Gobble exam, she appreciated no focal neurologic deficits; she appreciated the facial drooping to be subjective in nature. CT  head without evidence of hemorrhage or infarct. In light of patient's symptoms, however, have consulted with Dr. Philip Aspen in the patient's primary doctor who will admit for TIA workup.    Antonietta Breach, PA-C 03/15/13  1652

## 2013-03-15 NOTE — ED Provider Notes (Signed)
Medical screening examination/treatment/procedure(s) were conducted as a shared visit with non-physician practitioner(s) and myself.  I personally evaluated the patient during the encounter.  Pt presents w/ several days of sharp, atypical L sided CP at rest w/ radiaiton down abdomen and into back, worse w/ deep breathing. EKG with 1st deg AVB, unchanged from prior, trop not elevated.  Ddx included PE as well as atypical presentation for intraabdominal process.  CT chest ordered after d-dimer returned elevated. Non-con CT ab/pelvis also ordered. Upon returning from scan, nurse noted pt to have episode of staring off, then developed burning around eye, sensation of tongued swelling, and L facial droop.  On my exam, mental status normal.  Cannot appreciate facial droop though she does have LLE weakness.  Code CVA initiated.  CT head negative.  Neurology felt she did have facial droop on exam, but suspect seizure vs TIA.  Pt's PCP with [facility] Argue will admit for ACS r/o as well as  TIA w/u.       Neta Ehlers, MD 03/15/13 2010

## 2013-03-15 NOTE — H&P (Signed)
Carol Bright is an 80 y.o. female.   Chief Complaint: chest pain HPI:  The patient is an 80 year old woman who is well-known to me. Over the past few days she's had intermittent vague chest discomfort described as nonexertional, with location bearing from a substernal location to the mid back and lower left back area. The pain lasts up to 5 minutes at a time, is generally mild and not associated with shortness of breath, or nausea. The pain is not associated with meals. This was worked up fairly extensively in the emergency room earlier today with studies including an EKG, troponin I test, chest x-ray, and D-dimer test. The d-dimer level was slightly elevated, so she went on to have a CT angiogram of the chest that showed no evidence of pulmonary embolism or other acute cardiopulmonary disease. She was given morphine intravenously which was associated with a left-sided facial droop, a vacant stare, and transient difficulty speaking. At this point she feels back to her baseline other than having some mid back pain that is worse with changing from a semisitting lying position to a upright supine position. She is being admitted to help exclude the possibility of a non-STEMI and TIA workup.  Past Medical History  Diagnosis Date  . Hypertension   . Rheumatoid arthritis(714.0)     PT IS FOLLOWED BY DR Treutlen  . Hyperlipidemia   . Cataract     Bil  . H/O hiatal hernia   . Diverticulosis   . Orthostatic lightheadedness   . Palpitations   . Sinus bradycardia 02/04/2012  . Syncope and collapse 02/04/2012    "first time ever" (02/06/2012)  . History of bronchitis     "occasionally" (02/06/2012)  . Dyspnea on exertion     "off and on for years; just worse recently" (02/06/2012)  . Borderline diabetes   . GERD (gastroesophageal reflux disease)   . External hemorrhoids   . Guaiac + stool 2005    "went to Dr. Henrene Pastor; took out polyps" (02/06/2012)  . Daily headache     "recently"  (02/06/2012)  . Breast cancer 1996    BREAST CANCER -LEFT BREAST REMOVED -  1996     (Not in a hospital admission)  ADDITIONAL HOME MEDICATIONS: no additional home meds  PHYSICIANS INVOLVED IN CARE: Leanna Battles (PCP), Dorris Carnes (card), [name] (EP), Scarlette Shorts (GI), and Alphonzo Severance (ortho)  Past Surgical History  Procedure Laterality Date  . Mastectomy  1995    Left  . Cystectomy  1993    Left shoulder  . Colonoscopy  11/2011  . Polypectomy  ~2005; ~2008  . Tonsillectomy  1950's  . Vaginal hysterectomy  1970  . Cardiac catheterization  02/05/2012    "first one ever" (02/06/2012)    Family History  Problem Relation Age of Onset  . Cancer Mother     LEFT BREAST REMOVED  . Heart attack Mother   . Heart disease Mother   . Breast cancer Mother      Social History:  reports that she has been smoking Cigarettes.  She has a 25 pack-year smoking history. She has never used smokeless tobacco. She reports that she drinks alcohol. She reports that she does not use illicit drugs.  Allergies:  Allergies  Allergen Reactions  . Penicillins Hives     ROS: arthritis, cancer/tumor, high blood pressure, kidney disease and tobacco use  PHYSICAL EXAM: Blood pressure 181/80, pulse 63, temperature 98.3 F (36.8 C), temperature source Oral, resp. rate  19, height $RemoveBe'4\' 11"'POmuLxnXS$  (1.499 m), weight 65.772 kg (145 lb), SpO2 100.00%. In general the patient is an elderly Black woman who was in no apparent distress lying at 45 degree elevation of the HOB. HEENT exam was normal with no facial droop, neck was supple and without JVD or bruit, chest was clear to auscultation, heart had a regular rate and rhythm and was without murmur or gallop, abdomen had normal bowel sounds and no tenderness, Extremities were without edema, Neuro exam:  She is alert awake and x4, cranial nerves II through XII are normal, sensory exam was grossly normal, motor strength was 5 out of 5 in all extremities, she had normal  bilateral finger to nose testing, gait was not assessed.  Results for orders placed during the hospital encounter of 03/15/13 (from the past 48 hour(s))  CBC     Status: Abnormal   Collection Time    03/15/13 10:38 AM      Result Value Ref Range   WBC 11.8 (*) 4.0 - 10.5 K/uL   RBC 3.86 (*) 3.87 - 5.11 MIL/uL   Hemoglobin 11.7 (*) 12.0 - 15.0 g/dL   HCT 35.1 (*) 36.0 - 46.0 %   MCV 90.9  78.0 - 100.0 fL   MCH 30.3  26.0 - 34.0 pg   MCHC 33.3  30.0 - 36.0 g/dL   RDW 14.2  11.5 - 15.5 %   Platelets 303  150 - 400 K/uL  BASIC METABOLIC PANEL     Status: Abnormal   Collection Time    03/15/13 10:38 AM      Result Value Ref Range   Sodium 142  137 - 147 mEq/L   Potassium 4.4  3.7 - 5.3 mEq/L   Chloride 105  96 - 112 mEq/L   CO2 23  19 - 32 mEq/L   Glucose, Bld 92  70 - 99 mg/dL   BUN 11  6 - 23 mg/dL   Creatinine, Ser 0.83  0.50 - 1.10 mg/dL   Calcium 9.0  8.4 - 10.5 mg/dL   GFR calc non Af Amer 64 (*) >90 mL/min   GFR calc Af Amer 75 (*) >90 mL/min   Comment: (NOTE)     The eGFR has been calculated using the CKD EPI equation.     This calculation has not been validated in all clinical situations.     eGFR's persistently <90 mL/min signify possible Chronic Kidney     Disease.  PRO B NATRIURETIC PEPTIDE     Status: None   Collection Time    03/15/13 10:38 AM      Result Value Ref Range   Pro B Natriuretic peptide (BNP) 27.3  0 - 450 pg/mL  D-DIMER, QUANTITATIVE     Status: Abnormal   Collection Time    03/15/13 10:38 AM      Result Value Ref Range   D-Dimer, Quant 0.72 (*) 0.00 - 0.48 ug/mL-FEU   Comment:            AT THE INHOUSE ESTABLISHED CUTOFF     VALUE OF 0.48 ug/mL FEU,     THIS ASSAY HAS BEEN DOCUMENTED     IN THE LITERATURE TO HAVE     A SENSITIVITY AND NEGATIVE     PREDICTIVE VALUE OF AT LEAST     98 TO 99%.  THE TEST RESULT     SHOULD BE CORRELATED WITH     AN ASSESSMENT OF THE CLINICAL     PROBABILITY  OF DVT / VTE.  Randolm Idol, ED     Status: None    Collection Time    03/15/13 10:44 AM      Result Value Ref Range   Troponin i, poc 0.00  0.00 - 0.08 ng/mL   Comment 3            Comment: Due to the release kinetics of cTnI,     a negative result within the first hours     of the onset of symptoms does not rule out     myocardial infarction with certainty.     If myocardial infarction is still suspected,     repeat the test at appropriate intervals.  HEPATIC FUNCTION PANEL     Status: Abnormal   Collection Time    03/15/13 11:40 AM      Result Value Ref Range   Total Protein 8.4 (*) 6.0 - 8.3 g/dL   Albumin 3.6  3.5 - 5.2 g/dL   AST 66 (*) 0 - 37 U/L   Comment: HEMOLYSIS AT THIS LEVEL MAY AFFECT RESULT   ALT 21  0 - 35 U/L   Alkaline Phosphatase 81  39 - 117 U/L   Comment: HEMOLYSIS AT THIS LEVEL MAY AFFECT RESULT   Total Bilirubin 0.4  0.3 - 1.2 mg/dL   Comment: HEMOLYSIS AT THIS LEVEL MAY AFFECT RESULT   Bilirubin, Direct 0.3  0.0 - 0.3 mg/dL   Comment: HEMOLYSIS AT THIS LEVEL MAY AFFECT RESULT   Indirect Bilirubin 0.1 (*) 0.3 - 0.9 mg/dL  LIPASE, BLOOD     Status: None   Collection Time    03/15/13 11:40 AM      Result Value Ref Range   Lipase 32  11 - 59 U/L  URINALYSIS, ROUTINE W REFLEX MICROSCOPIC     Status: Abnormal   Collection Time    03/15/13 12:18 PM      Result Value Ref Range   Color, Urine YELLOW  YELLOW   APPearance CLOUDY (*) CLEAR   Specific Gravity, Urine 1.021  1.005 - 1.030   pH 5.0  5.0 - 8.0   Glucose, UA NEGATIVE  NEGATIVE mg/dL   Hgb urine dipstick NEGATIVE  NEGATIVE   Bilirubin Urine NEGATIVE  NEGATIVE   Ketones, ur NEGATIVE  NEGATIVE mg/dL   Protein, ur NEGATIVE  NEGATIVE mg/dL   Urobilinogen, UA 0.2  0.0 - 1.0 mg/dL   Nitrite NEGATIVE  NEGATIVE   Leukocytes, UA LARGE (*) NEGATIVE  URINE MICROSCOPIC-ADD ON     Status: Abnormal   Collection Time    03/15/13 12:18 PM      Result Value Ref Range   Squamous Epithelial / LPF MANY (*) RARE   WBC, UA 11-20  <3 WBC/hpf   Bacteria, UA RARE   RARE   Urine-Other TRICHOMONAS PRESENT    CBG MONITORING, ED     Status: None   Collection Time    03/15/13  4:27 PM      Result Value Ref Range   Glucose-Capillary 84  70 - 99 mg/dL   Comment 1 Notify RN     Comment 2 Documented in Chart     Ct Abdomen Pelvis Wo Contrast  03/15/2013   CLINICAL DATA:  Flank pain.  EXAM: CT ABDOMEN AND PELVIS WITHOUT CONTRAST  TECHNIQUE: Multidetector CT imaging of the abdomen and pelvis was performed following the standard protocol without intravenous contrast.  COMPARISON:  US RENAL dated 06/27/2012; CT ABD/PELVIS W CM dated 02/04/2012  FINDINGS: Liver normal. Spleen normal. Pancreas normal. No biliary distention.  Adrenals normal. 1.6 cm simple cyst anterior aspect upper pole right kidney. No hydronephrosis or obstructing ureteral stone. Bladder nondistended. Innumerable pelvic phleboliths. Hysterectomy. No pelvic masses. No free pelvic fluid collections.  No significant adenopathy. Atherosclerotic vascular calcification noted of the aorta and visceral vessels. No aneurysm.  Appendix normal. Prominent prominent sigmoid colonic diverticulosis is noted with mild adjacent fat streaking suggesting mild diverticulitis. No evidence of bowel distention. No free air. No mesenteric masses. Tiny umbilical hernia with herniation of fat only.  Mild bibasilar atelectasis. Cardiomegaly. Small pericardial effusion. Cardiac pacer. Diffuse degenerative changes lumbar spine and both hips.  IMPRESSION: 1. Sigmoid colonic diverticulosis with mild associated changes of diverticulitis .  2.  Small pericardial effusion.   Electronically Signed   By: Marcello Moores  Register   On: 03/15/2013 16:26   Dg Chest 2 View  03/15/2013   CLINICAL DATA:  Chest pain and shortness of breath.  EXAM: CHEST  2 VIEW  COMPARISON:  11/22/2012  FINDINGS: Cardiomegaly is stable. No evidence of acute infiltrate or pulmonary edema. No evidence of pleural effusion. No mass or lymphadenopathy identified. Dual lead  transvenous pacemaker remains in appropriate position. Thoracic spine degenerative changes again noted.  IMPRESSION: Stable cardiomegaly.  No active lung disease.   Electronically Signed   By: Earle Gell M.D.   On: 03/15/2013 12:48   Ct Head Wo Contrast  03/15/2013   CLINICAL DATA:  Code stroke, left-sided facial paralysis, left-sided weakness, right side eye pain with blurred vision, tongue feels cleared, history hypertension, breast cancer, hyperlipidemia  EXAM: CT HEAD WITHOUT CONTRAST  TECHNIQUE: Contiguous axial images were obtained from the base of the skull through the vertex without intravenous contrast.  COMPARISON:  02/04/2012  FINDINGS: Opacified intracranial vascular structures from preceding CTA chest.  Normal ventricular morphology.  No midline shift or mass effect.  Mild small vessel chronic ischemic changes of deep cerebral white matter.  No intracranial hemorrhage, mass lesion or evidence acute infarction.  No extra-axial fluid collections.  Bones and sinuses unremarkable.  IMPRESSION: Mild small vessel chronic ischemic changes of deep cerebral white matter.  No acute intracranial abnormalities.  Critical Value/emergent results were called by telephone at the time of interpretation on 03/15/2013 at 4:30 PM to Dr. Leonel Ramsay, who verbally acknowledged these results.   Electronically Signed   By: Lavonia Dana M.D.   On: 03/15/2013 16:34   Ct Angio Chest Pe W/cm &/or Wo Cm  03/15/2013   CLINICAL DATA:  Breast cancer  EXAM: CT ANGIOGRAPHY CHEST WITH CONTRAST  TECHNIQUE: Multidetector CT imaging of the chest was performed using the standard protocol during bolus administration of intravenous contrast. Multiplanar CT image reconstructions and MIPs were obtained to evaluate the vascular anatomy.  CONTRAST:  15mL OMNIPAQUE IOHEXOL 350 MG/ML SOLN  COMPARISON:  None.  FINDINGS: There are no filling defects in the pulmonary arterial tree to suggest acute pulmonary thromboembolism.  Small pericardial  effusion is present. Small scattered mediastinal nodes. Calcified mediastinal and hilar nodes are present as well. Right subclavian pacemaker device is in place.  Three vessel coronary artery calcifications. Small single left main coronary artery calcification.  No pneumothorax.  No pleural effusion.  Dependent atelectasis. No definite mass or consolidation. Subtle patchy ground-glass opacities in the right middle lobe are nonspecific.  No acute bony deformity.  Review of the MIP images confirms the above findings.  IMPRESSION: No evidence of acute pulmonary thromboembolism.  Small pericardial effusion.  Electronically Signed   By: Maryclare Bean M.D.   On: 03/15/2013 16:12     Assessment/Plan #1 Chest Pain:  Likely noncardiac given atypical features and reproduction of pain with changing from a semisupine position to a fully upright position, and we will check serial cardiac isoenzymes to exclude non-STEMI. We will increase prednisone to 20 mg daily for now.  #2 Altered mental status:  Episode could have been a TIA, but could also have been due to medication side effect. We will check telemetry and a carotid US exam. Heart exam is normal and she has periodic echos with her cardiologist, so a repeat echocardiogram would likely be of low yield.  #3 HTN: blood pressure systolic is a bit elevated so we will increase norvasc to 5 mg daily  Jaleena Viviani G 03/15/2013, 5:28 PM

## 2013-03-15 NOTE — ED Notes (Signed)
Pt aware of need for UA 

## 2013-03-15 NOTE — Consult Note (Signed)
Referring Physician: Dr Tawnya Crook   Chief Complaint: Stroke vs TIA  HPI: Carol Bright is an 80 y.o. female brought to the emergency department at [facility] today by her daughter for evaluation of left sided flank, back, and chest pain which has been intermittent since this past Tuesday. The episodes are fairly brief; however, they are frequent. She was sent for a CT angiogram of the chest as well as a CT of the abdomen and pelvis which were unremarkable except for a small pericardial effusion and some changes consistent with diverticulosis/diverticulitis. The patient has been recovering from a respiratory infection and continues to have a cough productive of sputum but this is improving.  After returning from the CT scans the patient requested some pain medication and was given intravenous morphine. Shortly thereafter she developed nausea and felt as if she would vomit. Two family members and the nurse in the room reported that the patient developed a left facial droop, had a fixed stare, and initially was not responding to their voices. When the patient did speak she had mildly slurred speech.   A code stroke was called and the patient was brought back for a stat CT of the head which was unremarkable for acute changes. Her left facial droop was beginning to improve by the time the CT scan was finished. She was brought back to her room in the emergency department and appeared to be essentially back to baseline. The patient's family reported that she had previously had episodes at home where she developed a fixed stare, mild confusion, and was briefly unresponsive. Unfortunately the patient cannot have an MRI secondary to a permanent pacemaker. Dr. Leonel Ramsay felt that this episode could possibly represent seizure activity, a TIA, or possibly a small stroke. She will be started on Keppra and aspirin. The patient will be admitted for a full workup.  Date last known well: Date: 03/15/2013 Time  last known well: Unable to determine tPA Given: No: The patient's deficits quickly resolved. When seen by neurology her NIH score was a 1.  Past Medical History  Diagnosis Date  . Hypertension   . Rheumatoid arthritis(714.0)     PT IS FOLLOWED BY DR Clarke  . Hyperlipidemia   . Cataract     Bil  . H/O hiatal hernia   . Diverticulosis   . Orthostatic lightheadedness   . Palpitations   . Sinus bradycardia 02/04/2012  . Syncope and collapse 02/04/2012    "first time ever" (02/06/2012)  . History of bronchitis     "occasionally" (02/06/2012)  . Dyspnea on exertion     "off and on for years; just worse recently" (02/06/2012)  . Borderline diabetes   . GERD (gastroesophageal reflux disease)   . External hemorrhoids   . Guaiac + stool 2005    "went to Dr. Henrene Pastor; took out polyps" (02/06/2012)  . Daily headache     "recently" (02/06/2012)  . Breast cancer 1996    BREAST CANCER -LEFT BREAST REMOVED -  1996    Past Surgical History  Procedure Laterality Date  . Mastectomy  1995    Left  . Cystectomy  1993    Left shoulder  . Colonoscopy  11/2011  . Polypectomy  ~2005; ~2008  . Tonsillectomy  1950's  . Vaginal hysterectomy  1970  . Cardiac catheterization  02/05/2012    "first one ever" (02/06/2012)    Family History  Problem Relation Age of Onset  . Cancer Mother  LEFT BREAST REMOVED  . Heart attack Mother   . Heart disease Mother   . Breast cancer Mother    Social History:  reports that she has been smoking Cigarettes.  She has a 25 pack-year smoking history. She has never used smokeless tobacco. She reports that she drinks alcohol. She reports that she does not use illicit drugs.  Allergies:  Allergies  Allergen Reactions  . Penicillins Hives    Medications:  Current Facility-Administered Medications  Medication Dose Route Frequency Provider Last Rate Last Dose  . levETIRAcetam (KEPPRA) 1,000 mg in sodium chloride 0.9 % 100 mL IVPB  1,000 mg  Intravenous Once Shanon Brow L Rinehuls, PA-C      . [START ON 03/16/2013] levETIRAcetam (KEPPRA) tablet 500 mg  500 mg Oral BID Early Chars Rinehuls, PA-C       Current Outpatient Prescriptions  Medication Sig Dispense Refill  . amLODipine (NORVASC) 2.5 MG tablet Take 1 tablet (2.5 mg total) by mouth daily.  30 tablet  6  . Calcium Citrate (CITRACAL PO) Take 1 tablet by mouth daily.       . cetirizine (ZYRTEC) 10 MG tablet Take 10 mg by mouth daily as needed for allergies.      Marland Kitchen docusate sodium 100 MG CAPS Take 100 mg by mouth daily.  10 capsule    . famotidine (PEPCID AC) 10 MG chewable tablet Chew 10 mg by mouth daily.       . folic acid (FOLVITE) 1 MG tablet Take 2 mg by mouth daily.      . methotrexate (RHEUMATREX) 2.5 MG tablet Take 25 mg by mouth once a week. Takes 10 tablets on Sunday. Caution:Chemotherapy. Protect from light.      . Multiple Vitamins-Minerals (CENTRUM SILVER PO) Take 1 tablet by mouth daily.      . predniSONE (DELTASONE) 1 MG tablet Take 3 mg by mouth daily. Takes 3 tablets daily      . rosuvastatin (CRESTOR) 5 MG tablet Take 1 tablet (5 mg total) by mouth at bedtime.  30 tablet  11  . Vitamin D, Ergocalciferol, (DRISDOL) 50000 UNITS CAPS capsule Take 50,000 Units by mouth every 7 (seven) days. Sunday.      . [DISCONTINUED] simvastatin (ZOCOR) 20 MG tablet Take 1 tablet (20 mg total) by mouth every evening.  30 tablet  6   I have reviewed the patient's current medications.  ROS: History obtained from the patient and family  General ROS: negative for - chills, fatigue, fever, night sweats, weight gain or weight loss Psychological ROS: negative for - behavioral disorder, hallucinations, memory difficulties, mood swings or suicidal ideation Ophthalmic ROS: negative for - blurry vision, double vision, eye pain or loss of vision ENT ROS: negative for - epistaxis, nasal discharge, oral lesions, sore throat, tinnitus or vertigo Allergy and Immunology ROS: negative for - hives or  itchy/watery eyes Hematological and Lymphatic ROS: negative for - bleeding problems, bruising or swollen lymph nodes Endocrine ROS: negative for - galactorrhea, hair pattern changes, polydipsia/polyuria or temperature intolerance Respiratory ROS: negative for - hemoptysis, shortness of breath or wheezing. Positive for a recent respiratory tract infection with residual cough. Cardiovascular ROS: negative for - chest pain, dyspnea on exertion, edema or irregular heartbeat Gastrointestinal ROS: negative for - abdominal pain, diarrhea, hematemesis, nausea/vomiting or stool incontinence Genito-Urinary ROS: negative for - dysuria, hematuria, incontinence or urinary frequency/urgency Musculoskeletal ROS: negative for - joint swelling or muscular weakness. Chronic arthritis Neurological ROS: as noted in HPI Dermatological ROS: negative  for rash and skin lesion changes  Physical Examination: Blood pressure 170/92, pulse 60, temperature 98.1 F (36.7 C), temperature source Oral, resp. rate 22, height 4\' 11"  (1.499 m), weight 65.772 kg (145 lb), SpO2 99.00%.  Neurologic Examination:  Mental Status: Alert, oriented, thought content appropriate.  Speech fluent without evidence of aphasia.  Able to follow 3 step commands without difficulty. Cranial Nerves: II: Discs flat bilaterally; Visual fields grossly normal, pupils equal, round, reactive to light and accommodation III,IV, VI: ptosis not present, extra-ocular motions intact bilaterally V,VII: smile symmetric, facial light touch sensation normal bilaterally VIII: hearing normal bilaterally IX,X: gag reflex present XI: bilateral shoulder shrug XII: midline tongue extension Motor: Right : Upper extremity   5/5    Left:     Upper extremity   5/5  Lower extremity   5/5     Lower extremity   5/5 Tone and bulk:normal tone throughout; no atrophy noted Sensory: Pinprick and light touch intact throughout, bilaterally Deep Tendon Reflexes: 2+ and  symmetric throughout Plantars: Right: downgoing   Left: downgoing Cerebellar: normal finger-to-nose, normal rapid alternating movements and normal heel-to-shin test Gait: normal gait and station CV: pulses palpable throughout   Laboratory Studies:  Basic Metabolic Panel:  Recent Labs Lab 03/15/13 1038  NA 142  K 4.4  CL 105  CO2 23  GLUCOSE 92  BUN 11  CREATININE 0.83  CALCIUM 9.0    Liver Function Tests:  Recent Labs Lab 03/15/13 1140  AST 66*  ALT 21  ALKPHOS 81  BILITOT 0.4  PROT 8.4*  ALBUMIN 3.6    Recent Labs Lab 03/15/13 1140  LIPASE 32   No results found for this basename: AMMONIA,  in the last 168 hours  CBC:  Recent Labs Lab 03/15/13 1038  WBC 11.8*  HGB 11.7*  HCT 35.1*  MCV 90.9  PLT 303    Cardiac Enzymes: No results found for this basename: CKTOTAL, CKMB, CKMBINDEX, TROPONINI,  in the last 168 hours  BNP: No components found with this basename: POCBNP,   CBG:  Recent Labs Lab 03/15/13 1627  GLUCAP 84    Microbiology: Results for orders placed during the hospital encounter of 02/04/12  URINE CULTURE     Status: None   Collection Time    02/04/12 12:21 PM      Result Value Ref Range Status   Specimen Description URINE, CLEAN CATCH   Final   Special Requests NONE   Final   Culture  Setup Time 02/04/2012 20:55   Final   Colony Count NO GROWTH   Final   Culture NO GROWTH   Final   Report Status 02/05/2012 FINAL   Final    Coagulation Studies: No results found for this basename: LABPROT, INR,  in the last 72 hours  Urinalysis:  Recent Labs Lab 03/15/13 1218  COLORURINE YELLOW  LABSPEC 1.021  PHURINE 5.0  GLUCOSEU NEGATIVE  HGBUR NEGATIVE  BILIRUBINUR NEGATIVE  KETONESUR NEGATIVE  PROTEINUR NEGATIVE  UROBILINOGEN 0.2  NITRITE NEGATIVE  LEUKOCYTESUR LARGE*    Lipid Panel:    Component Value Date/Time   CHOL 161 02/06/2012 0555   TRIG 87 02/06/2012 0555   HDL 55 02/06/2012 0555   CHOLHDL 2.9 02/06/2012 0555    VLDL 17 02/06/2012 0555   LDLCALC 89 02/06/2012 0555    HgbA1C:  No results found for this basename: HGBA1C    Urine Drug Screen:   No results found for this basename: labopia, cocainscrnur, labbenz, amphetmu, thcu, labbarb  Alcohol Level: No results found for this basename: ETH,  in the last 168 hours  Other results: EKG: Sinus rhythm rate 65 beats per minute - please refer to official reading for full details  Imaging:  Ct Abdomen Pelvis Wo Contrast  03/15/2013    1. Sigmoid colonic diverticulosis with mild associated changes of diverticulitis .  2.  Small pericardial effusion.     Dg Chest 2 View 03/15/2013    Stable cardiomegaly.  No active lung disease.      Ct Head Wo Contrast 03/15/2013    Mild small vessel chronic ischemic changes of deep cerebral white matter.  No acute intracranial abnormalities.    Ct Angio Chest Pe W/cm &/or Wo Cm 03/15/2013     No evidence of acute pulmonary thromboembolism.  Small pericardial effusion.     Assessment: 80 y.o. female who developed a left facial droop, fixed stare, and initially was unresponsive to voice stimulation after receiving intravenous morphine in the emergency department. When the patient did respond she had mild dysarthria. A code stroke was called and a stat CT of the head was obtained which was negative for acute findings. The patient cannot have an MRI secondary to her pacemaker. Her deficits quickly resolved and t-PA was not felt to be a consideration. There is some concern that she may have had a brief seizure; however, she will need a full stroke/TIA workup.  Stroke Risk Factors - family history, hyperlipidemia, hypertension and smoking  Plan: 1. HgbA1c, fasting lipid panel 2. MRI, MRA  Cannot be performed 2nd to pacemaker 3. PT consult, OT consult, Speech consult 4. Echocardiogram 5. Carotid dopplers 6. Prophylactic therapy-Antiplatelet med: Aspirin - dose 81mg  daily 7. Risk factor modification 8. Telemetry  monitoring 9. Frequent neuro checks   Lowry Ram [facility] Pager [phone] 03/15/2013, 5:07 PM  I have seen and evaluated the patient. I have reviewed the above note and made appropriate changes. On my initial evaluation of the patient, she did have left facial weakness which subsequently resolved. Given the description of the blank stare, amnestic period described by the patient, and paresthesia prior to onset, I am concerned that this could have represented a small complex partial seizure. TIA would also be possible.   Roland Rack, MD [facility] [phone]  If 7pm- 7am, please page neurology on call at [phone].

## 2013-03-15 NOTE — ED Notes (Signed)
Pt to xray

## 2013-03-15 NOTE — ED Notes (Signed)
Patient transported to X-ray 

## 2013-03-15 NOTE — ED Notes (Signed)
Pt reports onset of left side chest pains, started on Tuesday, stabbing pain to left chest and into her back, having sob. ekg done at triage.

## 2013-03-16 DIAGNOSIS — R079 Chest pain, unspecified: Secondary | ICD-10-CM

## 2013-03-16 DIAGNOSIS — G459 Transient cerebral ischemic attack, unspecified: Secondary | ICD-10-CM

## 2013-03-16 LAB — TROPONIN I: Troponin I: <0.3 ng/mL (ref <0.30)

## 2013-03-16 LAB — HEMOGLOBIN A1C
Hgb A1c MFr Bld: 5.9 % — ABNORMAL HIGH (ref <5.7)
Mean Plasma Glucose: 123 mg/dL — ABNORMAL HIGH (ref <117)

## 2013-03-16 MED ORDER — BISACODYL 10 MG RE SUPP
10.0000 mg | Freq: Once | RECTAL | Status: AC
  Administered 2013-03-16: 10 mg via RECTAL
  Filled 2013-03-16: qty 1

## 2013-03-16 MED ORDER — METOPROLOL TARTRATE 12.5 MG HALF TABLET
12.5000 mg | ORAL_TABLET | Freq: Two times a day (BID) | ORAL | Status: DC
  Administered 2013-03-16 – 2013-03-17 (×3): 12.5 mg via ORAL
  Filled 2013-03-16 (×4): qty 1

## 2013-03-16 MED ORDER — SODIUM CHLORIDE 0.9 % IV SOLN
1000.0000 mg | Freq: Once | INTRAVENOUS | Status: AC
  Administered 2013-03-16: 1000 mg via INTRAVENOUS
  Filled 2013-03-16: qty 10

## 2013-03-16 MED ORDER — LEVETIRACETAM 500 MG PO TABS
1000.0000 mg | ORAL_TABLET | Freq: Two times a day (BID) | ORAL | Status: DC
  Administered 2013-03-16 – 2013-03-17 (×2): 1000 mg via ORAL
  Filled 2013-03-16 (×3): qty 2

## 2013-03-16 NOTE — Progress Notes (Signed)
Subjective: Feels Ok other than having mild fatigue, and vague chest discomfort has nearly resolved. At home she is able to consume a mechanical soft diet by following chopped foods with liquids without significant associated coughing.   Objective: Vital signs in last 24 hours: Temp:  [98.1 F (36.7 C)-98.6 F (37 C)] 98.2 F (36.8 C) (02/22 0400) Pulse Rate:  [59-84] 61 (02/22 1325) Resp:  [15-22] 16 (02/22 1325) BP: (107-209)/(54-95) 107/59 mmHg (02/22 1325) SpO2:  [93 %-100 %] 100 % (02/22 1325) Weight:  [66.724 kg (147 lb 1.6 oz)] 66.724 kg (147 lb 1.6 oz) (02/21 1848) Weight change:    Intake/Output from previous day:     General appearance: alert, cooperative and no distress Resp: clear to auscultation bilaterally Cardio: regular rate and rhythm GI: soft, non-tender; bowel sounds normal; no masses,  no organomegaly Neurologic: Grossly normal  Lab Results:  Recent Labs  03/15/13 1038  WBC 11.8*  HGB 11.7*  HCT 35.1*  PLT 303   BMET  Recent Labs  03/15/13 1038  NA 142  K 4.4  CL 105  CO2 23  GLUCOSE 92  BUN 11  CREATININE 0.83  CALCIUM 9.0   CMET CMP     Component Value Date/Time   NA 142 03/15/2013 1038   K 4.4 03/15/2013 1038   CL 105 03/15/2013 1038   CO2 23 03/15/2013 1038   GLUCOSE 92 03/15/2013 1038   BUN 11 03/15/2013 1038   CREATININE 0.83 03/15/2013 1038   CALCIUM 9.0 03/15/2013 1038   PROT 8.4* 03/15/2013 1140   ALBUMIN 3.6 03/15/2013 1140   AST 66* 03/15/2013 1140   ALT 21 03/15/2013 1140   ALKPHOS 81 03/15/2013 1140   BILITOT 0.4 03/15/2013 1140   GFRNONAA 64* 03/15/2013 1038   GFRAA 75* 03/15/2013 1038    CBG (last 3)   Recent Labs  03/15/13 1627  GLUCAP 84    INR RESULTS:   Lab Results  Component Value Date   INR 1.08 02/05/2012   INR 1.1 03/26/2008     Studies/Results: Ct Abdomen Pelvis Wo Contrast  03/15/2013   CLINICAL DATA:  Flank pain.  EXAM: CT ABDOMEN AND PELVIS WITHOUT CONTRAST  TECHNIQUE: Multidetector CT imaging of  the abdomen and pelvis was performed following the standard protocol without intravenous contrast.  COMPARISON:  US RENAL dated 06/27/2012; CT ABD/PELVIS W CM dated 02/04/2012  FINDINGS: Liver normal. Spleen normal. Pancreas normal. No biliary distention.  Adrenals normal. 1.6 cm simple cyst anterior aspect upper pole right kidney. No hydronephrosis or obstructing ureteral stone. Bladder nondistended. Innumerable pelvic phleboliths. Hysterectomy. No pelvic masses. No free pelvic fluid collections.  No significant adenopathy. Atherosclerotic vascular calcification noted of the aorta and visceral vessels. No aneurysm.  Appendix normal. Prominent prominent sigmoid colonic diverticulosis is noted with mild adjacent fat streaking suggesting mild diverticulitis. No evidence of bowel distention. No free air. No mesenteric masses. Tiny umbilical hernia with herniation of fat only.  Mild bibasilar atelectasis. Cardiomegaly. Small pericardial effusion. Cardiac pacer. Diffuse degenerative changes lumbar spine and both hips.  IMPRESSION: 1. Sigmoid colonic diverticulosis with mild associated changes of diverticulitis .  2.  Small pericardial effusion.   Electronically Signed   By: Marcello Moores  Register   On: 03/15/2013 16:26   Dg Chest 2 View  03/15/2013   CLINICAL DATA:  Chest pain and shortness of breath.  EXAM: CHEST  2 VIEW  COMPARISON:  11/22/2012  FINDINGS: Cardiomegaly is stable. No evidence of acute infiltrate or pulmonary edema. No  evidence of pleural effusion. No mass or lymphadenopathy identified. Dual lead transvenous pacemaker remains in appropriate position. Thoracic spine degenerative changes again noted.  IMPRESSION: Stable cardiomegaly.  No active lung disease.   Electronically Signed   By: Earle Gell M.D.   On: 03/15/2013 12:48   Ct Head Wo Contrast  03/15/2013   CLINICAL DATA:  Code stroke, left-sided facial paralysis, left-sided weakness, right side eye pain with blurred vision, tongue feels cleared, history  hypertension, breast cancer, hyperlipidemia  EXAM: CT HEAD WITHOUT CONTRAST  TECHNIQUE: Contiguous axial images were obtained from the base of the skull through the vertex without intravenous contrast.  COMPARISON:  02/04/2012  FINDINGS: Opacified intracranial vascular structures from preceding CTA chest.  Normal ventricular morphology.  No midline shift or mass effect.  Mild small vessel chronic ischemic changes of deep cerebral white matter.  No intracranial hemorrhage, mass lesion or evidence acute infarction.  No extra-axial fluid collections.  Bones and sinuses unremarkable.  IMPRESSION: Mild small vessel chronic ischemic changes of deep cerebral white matter.  No acute intracranial abnormalities.  Critical Value/emergent results were called by telephone at the time of interpretation on 03/15/2013 at 4:30 PM to Dr. Leonel Ramsay, who verbally acknowledged these results.   Electronically Signed   By: Lavonia Dana M.D.   On: 03/15/2013 16:34   Ct Angio Chest Pe W/cm &/or Wo Cm  03/15/2013   CLINICAL DATA:  Breast cancer  EXAM: CT ANGIOGRAPHY CHEST WITH CONTRAST  TECHNIQUE: Multidetector CT imaging of the chest was performed using the standard protocol during bolus administration of intravenous contrast. Multiplanar CT image reconstructions and MIPs were obtained to evaluate the vascular anatomy.  CONTRAST:  48mL OMNIPAQUE IOHEXOL 350 MG/ML SOLN  COMPARISON:  None.  FINDINGS: There are no filling defects in the pulmonary arterial tree to suggest acute pulmonary thromboembolism.  Small pericardial effusion is present. Small scattered mediastinal nodes. Calcified mediastinal and hilar nodes are present as well. Right subclavian pacemaker device is in place.  Three vessel coronary artery calcifications. Small single left main coronary artery calcification.  No pneumothorax.  No pleural effusion.  Dependent atelectasis. No definite mass or consolidation. Subtle patchy ground-glass opacities in the right middle lobe are  nonspecific.  No acute bony deformity.  Review of the MIP images confirms the above findings.  IMPRESSION: No evidence of acute pulmonary thromboembolism.  Small pericardial effusion.   Electronically Signed   By: Maryclare Bean M.D.   On: 03/15/2013 16:12    Medications: I have reviewed the patient's current medications.  Assessment/Plan: #1 Chest Pain: not likely cardiac and more likely due to DDD versus OA. Possible unusual seizures. Agree with plans for EEG tomorrow. Eventual cardiologist outpatient reevaluation of chest discomfort. #2 Dysphagia: sounds like primarily due to presbyesophagus and lack of teeth, but could have a stricture. Less likely to have significant aspiration by history. We will have her start a mechanical soft diet, resume po meds, and arrange for a modified barium swallow test tomorrow.  #3 HTN: blood pressures low with conversion from po to alternative route treatment. Will reassess on po meds.    LOS: 1 day   Keyari Kleeman G 03/16/2013, 1:32 PM

## 2013-03-16 NOTE — Progress Notes (Signed)
Utilization Review Completed.   Elfie Costanza, RN, BSN Nurse Case Manager  

## 2013-03-16 NOTE — Progress Notes (Signed)
VASCULAR LAB PRELIMINARY  PRELIMINARY  PRELIMINARY  PRELIMINARY  Carotid duplex completed.    Preliminary report:  Bilateral:  1-39% ICA stenosis lowest end of range.  Vertebral artery flow is antegrade.     Jaceyon Strole, RVS 03/16/2013, 1:19 PM

## 2013-03-16 NOTE — Progress Notes (Addendum)
Subjective: Cotninues to have some of the chest pain, no further events.   Exam: Filed Vitals:   03/16/13 0920  BP: 135/69  Pulse:   Temp:   Resp:    Gen: In bed, NAD MS: Awake, Alert, interactive and appropriate VX:BLTJQ, EOMI, VFF Motor: full strength throughout.  Sensory:intact to LT and pin, improvement from yesterday.   Impression: 80 yo F with transient left sided facial and arm weakness. This occurred shortly after getting morphine, and also after getting nauseous and retching without emesis. Possibilities would include TIA secondary to valsalva from the retching, seizure given the preceding facial paresthesia, or medication reaction.   I am not certain what is causing the left sided chest pain. There is a small chance that this could represent focal sensoryseizures but I think that this would be very unusual and without an abnormal EEG this would be very difficult to prove. Could consider increasing keppra to see if they stop.   Recommendations: 1) Repeat keppra 1gm IV x1, then increase to 1gm q12h 2) dopplers, echo 3) EEG cannot be done on the weekend. I would favor performing this as an inpatient.  4) would continue asa as there is a chance this could have been TIA.  5) Will continue to follow.   Roland Rack, MD [facility] [phone]  If 7pm- 7am, please page neurology on call at [phone].

## 2013-03-17 ENCOUNTER — Ambulatory Visit: Payer: Medicare Other | Admitting: Internal Medicine

## 2013-03-17 ENCOUNTER — Inpatient Hospital Stay (HOSPITAL_COMMUNITY): Payer: Medicare Other

## 2013-03-17 DIAGNOSIS — G459 Transient cerebral ischemic attack, unspecified: Secondary | ICD-10-CM | POA: Diagnosis present

## 2013-03-17 DIAGNOSIS — I25810 Atherosclerosis of coronary artery bypass graft(s) without angina pectoris: Secondary | ICD-10-CM

## 2013-03-17 DIAGNOSIS — I319 Disease of pericardium, unspecified: Secondary | ICD-10-CM

## 2013-03-17 DIAGNOSIS — R1314 Dysphagia, pharyngoesophageal phase: Secondary | ICD-10-CM | POA: Diagnosis present

## 2013-03-17 LAB — LIPID PANEL
CHOLESTEROL: 177 mg/dL (ref 0–200)
HDL: 62 mg/dL (ref >39)
LDL Cholesterol: 94 mg/dL (ref 0–99)
TRIGLYCERIDES: 105 mg/dL (ref <150)
Total CHOL/HDL Ratio: 2.9 RATIO
VLDL: 21 mg/dL (ref 0–40)

## 2013-03-17 MED ORDER — ASPIRIN EC 81 MG PO TBEC
81.0000 mg | DELAYED_RELEASE_TABLET | Freq: Every day | ORAL | Status: DC
  Filled 2013-03-17: qty 1

## 2013-03-17 NOTE — Discharge Instructions (Signed)
STROKE/TIA DISCHARGE INSTRUCTIONS SMOKING Cigarette smoking nearly doubles your risk of having a stroke & is the single most alterable risk factor  If you smoke or have smoked in the last 12 months, you are advised to quit smoking for your health.  Most of the excess cardiovascular risk related to smoking disappears within a year of stopping.  Ask you doctor about anti-smoking medications  [place] Quit Line: 1-800-QUIT NOW  Free Smoking Cessation Classes [phone]  CHOLESTEROL Know your levels; limit fat & cholesterol in your diet  Lipid Panel     Component Value Date/Time   CHOL 177 03/17/2013 1310   TRIG 105 03/17/2013 1310   HDL 62 03/17/2013 1310   CHOLHDL 2.9 03/17/2013 1310   VLDL 21 03/17/2013 1310   LDLCALC 94 03/17/2013 1310      Many patients benefit from treatment even if their cholesterol is at goal.  Goal: Total Cholesterol (CHOL) less than 160  Goal:  Triglycerides (TRIG) less than 150  Goal:  HDL greater than 40  Goal:  LDL (LDLCALC) less than 100   BLOOD PRESSURE American Stroke Association blood pressure target is less that 120/80 mm/Hg  Your discharge blood pressure is:  BP: 124/62 mmHg  Monitor your blood pressure  Limit your salt and alcohol intake  Many individuals will require more than one medication for high blood pressure  DIABETES (A1c is a blood sugar average for last 3 months) Goal HGBA1c is under 7% (HBGA1c is blood sugar average for last 3 months)  Diabetes: No known diagnosis of diabetes    Lab Results  Component Value Date   HGBA1C 5.9* 03/16/2013     Your HGBA1c can be lowered with medications, healthy diet, and exercise.  Check your blood sugar as directed by your physician  Call your physician if you experience unexplained or low blood sugars.  PHYSICAL ACTIVITY/REHABILITATION Goal is 30 minutes at least 4 days per week  Activity: Increase activity slowly, Therapies: Physical Therapy: N/A and Speech Therapy: N/A Return to work: NA   Activity decreases your risk of heart attack and stroke and makes your heart stronger.  It helps control your weight and blood pressure; helps you relax and can improve your mood.  Participate in a regular exercise program.  Talk with your doctor about the best form of exercise for you (dancing, walking, swimming, cycling).  DIET/WEIGHT Goal is to maintain a healthy weight  Your discharge diet is: Dysphagia 3 (mechanical soft) thin liquids Your height is:  Height: 4\' 11"  (149.9 cm) Your current weight is: Weight: 66.724 kg (147 lb 1.6 oz) Your Body Mass Index (BMI) is:  BMI (Calculated): 29.8  Following the type of diet specifically designed for you will help prevent another stroke.  Your goal weight range is:  86-105  Your goal Body Mass Index (BMI) is 19-24.  Healthy food habits can help reduce 3 risk factors for stroke:  High cholesterol, hypertension, and excess weight.  RESOURCES Stroke/Support Group:  Call [phone]   STROKE EDUCATION PROVIDED/REVIEWED AND GIVEN TO PATIENT Stroke warning signs and symptoms How to activate emergency medical system (call 911). Medications prescribed at discharge. Need for follow-up after discharge. Personal risk factors for stroke. Pneumonia vaccine given: No Flu vaccine given: No My questions have been answered, the writing is legible, and I understand these instructions.  I will adhere to these goals & educational materials that have been provided to me after my discharge from the hospital.

## 2013-03-17 NOTE — Procedures (Signed)
History: 80 yo F with transient episode of facial droop.   Sedation: None  Technique: This is a 17 channel routine scalp EEG performed at the bedside with bipolar and monopolar montages arranged in accordance to the international 10/20 system of electrode placement. One channel was dedicated to EKG recording.    Background: There is a fast-variant alpha rhythm that is well defined posterior dominant rhythm of 8.5 Hz that attenuates with eye opening. The background consists of intermixed alpha and beta activities. The predominance     Photic stimulation: Physiologic driving is not performed   EEG Abnormalities: Normal   Clinical Interpretation: This normal EEG is recorded in the waking and sleep state. There was no seizure or seizure predisposition recorded on this study.   Roland Rack, MD [facility] [phone]  If 7pm- 7am, please page neurology on call at [phone].

## 2013-03-17 NOTE — Procedures (Signed)
Objective Swallowing Evaluation: Modified Barium Swallowing Study  Patient Details  Name: Carol Bright MRN: [id number] Date of Birth: [birth year]  Today's Date: 03/17/2013 Time: 1140-1200 SLP Time Calculation (min): 20 min  Past Medical History:  Past Medical History  Diagnosis Date  . Hypertension   . Rheumatoid arthritis(714.0)     PT IS FOLLOWED BY DR Fairmont  . Hyperlipidemia   . Cataract     Bil  . H/O hiatal hernia   . Diverticulosis   . Orthostatic lightheadedness   . Palpitations   . Sinus bradycardia 02/04/2012  . Syncope and collapse 02/04/2012    "first time ever" (02/06/2012)  . History of bronchitis     "occasionally" (02/06/2012)  . Dyspnea on exertion     "off and on for years; just worse recently" (02/06/2012)  . Borderline diabetes   . GERD (gastroesophageal reflux disease)   . External hemorrhoids   . Guaiac + stool 2005    "went to Dr. Henrene Pastor; took out polyps" (02/06/2012)  . Daily headache     "recently" (02/06/2012)  . Breast cancer 1996    BREAST CANCER -LEFT BREAST REMOVED -  1996   Past Surgical History:  Past Surgical History  Procedure Laterality Date  . Mastectomy  1995    Left  . Cystectomy  1993    Left shoulder  . Colonoscopy  11/2011  . Polypectomy  ~2005; ~2008  . Tonsillectomy  1950's  . Vaginal hysterectomy  1970  . Cardiac catheterization  02/05/2012    "first one ever" (02/06/2012)   HPI:  80 yo Female with PMH of hiatal hernia, GERD, HTN, admitted with intermittent vague chest discomfort. She was given morphine intravenously which was associated with transient left sided facial and arm weakness.  Possibilities noted included  TIA secondary to valsalva from the retching, seizure given the preceding facial paresthesia, or medication reaction. History present for dysphagia. MD suspects presbyesophagus and lack of dentition as cause however aspiration not ruled out. MBS ordered.      Assessment / Plan /  Recommendation Clinical Impression  Dysphagia Diagnosis: Suspected primary esophageal dysphagia Clinical impression: Patient presents with a suspected primary esophageal dysphagia given c/o globus and lingual pumping (reports anxiety regarding larger bolus sizes with solids) despite an functional oropharyngeal swallow observed. Patient with full airway protection and pharyngeal clearance. Independently utilizing safe swallowing strategies including intake of soft solids, small bolus sizes, and upright posture. No SLP f/u indicated at this time.     Treatment Recommendation  No treatment recommended at this time    Diet Recommendation Dysphagia 3 (Mechanical Soft);Thin liquid   Liquid Administration via: Cup;Straw Medication Administration: Whole meds with liquid Supervision: Patient able to self feed Compensations: Slow rate;Small sips/bites Postural Changes and/or Swallow Maneuvers: Seated upright 90 degrees;Upright 30-60 min after meal    Other  Recommendations Oral Care Recommendations: Oral care BID   Follow Up Recommendations  None            General HPI: 80 yo Female with PMH of hiatal hernia, GERD, HTN, admitted with intermittent vague chest discomfort. She was given morphine intravenously which was associated with transient left sided facial and arm weakness.  Possibilities noted included  TIA secondary to valsalva from the retching, seizure given the preceding facial paresthesia, or medication reaction. History present for dysphagia. MD suspects presbyesophagus and lack of dentition as cause however aspiration not ruled out. MBS ordered.  Type of Study: Modified Barium Swallowing Study Reason for  Referral: Objectively evaluate swallowing function Previous Swallow Assessment: none Diet Prior to this Study: Dysphagia 3 (soft);Thin liquids Temperature Spikes Noted: No Respiratory Status: Room air History of Recent Intubation: No Behavior/Cognition: Alert;Cooperative;Pleasant  mood Oral Cavity - Dentition: Poor condition;Missing dentition Oral Motor / Sensory Function: Within functional limits Self-Feeding Abilities: Able to feed self Patient Positioning: Upright in chair Baseline Vocal Quality: Clear Volitional Cough: Strong Volitional Swallow: Able to elicit Anatomy: Within functional limits Pharyngeal Secretions: Not observed secondary MBS    Reason for Referral Objectively evaluate swallowing function   Oral Phase Oral Preparation/Oral Phase Oral Phase: Impaired Oral - Thin Oral - Thin Cup: Lingual pumping;Piecemeal swallowing Oral - Thin Straw: Lingual pumping;Piecemeal swallowing Oral - Solids Oral - Puree: Lingual pumping;Piecemeal swallowing Oral - Mechanical Soft: Lingual pumping;Piecemeal swallowing Oral - Pill: Lingual pumping   Pharyngeal Phase Pharyngeal Phase Pharyngeal Phase: Within functional limits  Cervical Esophageal Phase    GO   Carol Rainwater MA, CCC-SLP [phone]  Cervical Esophageal Phase Cervical Esophageal Phase: [facility]         Carol Bright Carol Bright 03/17/2013, 12:59 PM

## 2013-03-17 NOTE — Progress Notes (Signed)
Subjective: She slept well and has occasional twinges of mid back pain, no chest pain or dyspnea  Objective: Vital signs in last 24 hours: Temp:  [98.2 F (36.8 C)-98.9 F (37.2 C)] 98.2 F (36.8 C) (02/23 0400) Pulse Rate:  [61-65] 61 (02/23 0400) Resp:  [15-16] 15 (02/23 0400) BP: (107-135)/(57-69) 134/66 mmHg (02/23 0400) SpO2:  [95 %-100 %] 98 % (02/23 0400) Weight change:    Intake/Output from previous day:     General appearance: alert, cooperative and no distress Resp: clear to auscultation bilaterally Cardio: regular rate and rhythm GI: soft, non-tender; bowel sounds normal; no masses,  no organomegaly Extremities: extremities normal, atraumatic, no cyanosis or edema  Lab Results:  Recent Labs  03/15/13 1038  WBC 11.8*  HGB 11.7*  HCT 35.1*  PLT 303   BMET  Recent Labs  03/15/13 1038  NA 142  K 4.4  CL 105  CO2 23  GLUCOSE 92  BUN 11  CREATININE 0.83  CALCIUM 9.0   CMET CMP     Component Value Date/Time   NA 142 03/15/2013 1038   K 4.4 03/15/2013 1038   CL 105 03/15/2013 1038   CO2 23 03/15/2013 1038   GLUCOSE 92 03/15/2013 1038   BUN 11 03/15/2013 1038   CREATININE 0.83 03/15/2013 1038   CALCIUM 9.0 03/15/2013 1038   PROT 8.4* 03/15/2013 1140   ALBUMIN 3.6 03/15/2013 1140   AST 66* 03/15/2013 1140   ALT 21 03/15/2013 1140   ALKPHOS 81 03/15/2013 1140   BILITOT 0.4 03/15/2013 1140   GFRNONAA 64* 03/15/2013 1038   GFRAA 75* 03/15/2013 1038    CBG (last 3)   Recent Labs  03/15/13 1627  GLUCAP 84    INR RESULTS:   Lab Results  Component Value Date   INR 1.08 02/05/2012   INR 1.1 03/26/2008     Studies/Results: Ct Abdomen Pelvis Wo Contrast  03/15/2013   CLINICAL DATA:  Flank pain.  EXAM: CT ABDOMEN AND PELVIS WITHOUT CONTRAST  TECHNIQUE: Multidetector CT imaging of the abdomen and pelvis was performed following the standard protocol without intravenous contrast.  COMPARISON:  US RENAL dated 06/27/2012; CT ABD/PELVIS W CM dated 02/04/2012   FINDINGS: Liver normal. Spleen normal. Pancreas normal. No biliary distention.  Adrenals normal. 1.6 cm simple cyst anterior aspect upper pole right kidney. No hydronephrosis or obstructing ureteral stone. Bladder nondistended. Innumerable pelvic phleboliths. Hysterectomy. No pelvic masses. No free pelvic fluid collections.  No significant adenopathy. Atherosclerotic vascular calcification noted of the aorta and visceral vessels. No aneurysm.  Appendix normal. Prominent prominent sigmoid colonic diverticulosis is noted with mild adjacent fat streaking suggesting mild diverticulitis. No evidence of bowel distention. No free air. No mesenteric masses. Tiny umbilical hernia with herniation of fat only.  Mild bibasilar atelectasis. Cardiomegaly. Small pericardial effusion. Cardiac pacer. Diffuse degenerative changes lumbar spine and both hips.  IMPRESSION: 1. Sigmoid colonic diverticulosis with mild associated changes of diverticulitis .  2.  Small pericardial effusion.   Electronically Signed   By: Marcello Moores  Register   On: 03/15/2013 16:26   Dg Chest 2 View  03/15/2013   CLINICAL DATA:  Chest pain and shortness of breath.  EXAM: CHEST  2 VIEW  COMPARISON:  11/22/2012  FINDINGS: Cardiomegaly is stable. No evidence of acute infiltrate or pulmonary edema. No evidence of pleural effusion. No mass or lymphadenopathy identified. Dual lead transvenous pacemaker remains in appropriate position. Thoracic spine degenerative changes again noted.  IMPRESSION: Stable cardiomegaly.  No active lung  disease.   Electronically Signed   By: Earle Gell M.D.   On: 03/15/2013 12:48   Ct Head Wo Contrast  03/15/2013   CLINICAL DATA:  Code stroke, left-sided facial paralysis, left-sided weakness, right side eye pain with blurred vision, tongue feels cleared, history hypertension, breast cancer, hyperlipidemia  EXAM: CT HEAD WITHOUT CONTRAST  TECHNIQUE: Contiguous axial images were obtained from the base of the skull through the vertex  without intravenous contrast.  COMPARISON:  02/04/2012  FINDINGS: Opacified intracranial vascular structures from preceding CTA chest.  Normal ventricular morphology.  No midline shift or mass effect.  Mild small vessel chronic ischemic changes of deep cerebral white matter.  No intracranial hemorrhage, mass lesion or evidence acute infarction.  No extra-axial fluid collections.  Bones and sinuses unremarkable.  IMPRESSION: Mild small vessel chronic ischemic changes of deep cerebral white matter.  No acute intracranial abnormalities.  Critical Value/emergent results were called by telephone at the time of interpretation on 03/15/2013 at 4:30 PM to Dr. Leonel Ramsay, who verbally acknowledged these results.   Electronically Signed   By: Lavonia Dana M.D.   On: 03/15/2013 16:34   Ct Angio Chest Pe W/cm &/or Wo Cm  03/15/2013   CLINICAL DATA:  Breast cancer  EXAM: CT ANGIOGRAPHY CHEST WITH CONTRAST  TECHNIQUE: Multidetector CT imaging of the chest was performed using the standard protocol during bolus administration of intravenous contrast. Multiplanar CT image reconstructions and MIPs were obtained to evaluate the vascular anatomy.  CONTRAST:  31mL OMNIPAQUE IOHEXOL 350 MG/ML SOLN  COMPARISON:  None.  FINDINGS: There are no filling defects in the pulmonary arterial tree to suggest acute pulmonary thromboembolism.  Small pericardial effusion is present. Small scattered mediastinal nodes. Calcified mediastinal and hilar nodes are present as well. Right subclavian pacemaker device is in place.  Three vessel coronary artery calcifications. Small single left main coronary artery calcification.  No pneumothorax.  No pleural effusion.  Dependent atelectasis. No definite mass or consolidation. Subtle patchy ground-glass opacities in the right middle lobe are nonspecific.  No acute bony deformity.  Review of the MIP images confirms the above findings.  IMPRESSION: No evidence of acute pulmonary thromboembolism.  Small  pericardial effusion.   Electronically Signed   By: Maryclare Bean M.D.   On: 03/15/2013 16:12    Medications: I have reviewed the patient's current medications.  Assessment/Plan: #1 Chest Pain:  Likely non cardiac with enzymes negative and will plan on outpatient cardiologist risk stratification. #2 TIA versus Seizure: EEG today and if without seizure focus may discharge to home later in the day   LOS: 2 days   Carol Bright G 03/17/2013, 7:59 AM

## 2013-03-17 NOTE — Progress Notes (Signed)
EEG Completed; Results Pending  

## 2013-03-17 NOTE — Progress Notes (Signed)
  Echocardiogram 2D Echocardiogram has been performed.  [place], [facility] 03/17/2013, 10:28 AM

## 2013-03-17 NOTE — Discharge Summary (Addendum)
Physician Discharge Summary  Patient ID: Carol Bright MRN: [id number] DOB/AGE: 1930/12/18 80 y.o.  Admit date: 03/15/2013 Discharge date: 03/17/2013   Discharge Diagnoses:  Principal Problem:   Chest pain Active Problems:   HTN (hypertension)   Altered mental status   TIA (transient ischemic attack)   CAD (coronary artery disease) of artery bypass graft   Dysphagia, pharyngoesophageal phase   Dyslipidemia   Discharged Condition: good  Hospital Course: The patient is an 80 year old woman who is well-known to me. Over the past few days she's had intermittent vague chest discomfort described as nonexertional, with location bearing from a substernal location to the mid back and lower left back area. The pain lasts up to 5 minutes at a time, is generally mild and not associated with shortness of breath, or nausea. The pain is not associated with meals. This was worked up fairly extensively in the emergency room earlier today with studies including an EKG, troponin I test, chest x-ray, and D-dimer test. The d-dimer level was slightly elevated, so she went on to have a CT angiogram of the chest that showed no evidence of pulmonary embolism or other acute cardiopulmonary disease. She was given morphine intravenously which was associated with a left-sided facial droop, a vacant stare, and transient difficulty speaking. At this point she feels back to her baseline other than having some mid back pain that is worse with changing from a semisitting lying position to a upright supine position.  She was admitted to a medical bed with telemetry and showed no significant arrhythmia.  She had serial cardiac isoenzymes drawn that were normal.  She had multiple studies done during this hospitalization that included a CT angiogram of the chest that showed no evidence of pulmonary embolism and did reveal three-vessel coronary artery disease, a CT scan of the abdomen and pelvis done without IV contrast showed  sigmoid diverticulosis with possible mild diverticulitis, a CT scan of the head done without contrast showed no acute abnormalities, a modified barium swallow study showed no significant swallowing problems, and echocardiogram showed left ventricular ejection fraction of 55% with a wire and the right ventricle consistent with pacing, and no significant valvular heart disease.  Carotid ultrasound exam showed bilateral less than 40% stenosis of the internal carotid arteries with antegrade vertebral artery flow.  Lastly, an electroencephalogram showed no evidence of a seizure focus.  She was also followed closely by a neurologist during her stay who felt that there was a outside possibility that she was having atypical seizures, and he did not recommend chronic antiepileptic drug therapy following the EEG study.  On the morning of discharge she felt fine with no significant chest pain or shortness of breath.  She had intermittent mild back pain, however.  She was eating well and not having any dyspnea or abdominal pain or fever or chills.  There were no competitions from her hospitalization.  Consults: neurology  Significant Diagnostic Studies:  Dg Swallowing Func-speech Pathology  03/17/2013   [facility] McCoy, CCC-SLP     03/17/2013  1:00 PM Objective Swallowing Evaluation: Modified Barium Swallowing Study   Patient Details  Name: Carol Bright MRN: [id number] Date of Birth: Apr 24, 1930  Today's Date: 03/17/2013 Time: 1140-1200 SLP Time Calculation (min): 20 min  Past Medical History:  Past Medical History  Diagnosis Date  . Hypertension   . Rheumatoid arthritis(714.0)     PT IS FOLLOWED BY DR [place]  . Hyperlipidemia   . Cataract  Bil  . H/O hiatal hernia   . Diverticulosis   . Orthostatic lightheadedness   . Palpitations   . Sinus bradycardia 02/04/2012  . Syncope and collapse 02/04/2012    "first time ever" (02/06/2012)  . History of bronchitis     "occasionally" (02/06/2012)  . Dyspnea on  exertion     "off and on for years; just worse recently" (02/06/2012)  . Borderline diabetes   . GERD (gastroesophageal reflux disease)   . External hemorrhoids   . Guaiac + stool 2005    "went to Dr. Henrene Pastor; took out polyps" (02/06/2012)  . Daily headache     "recently" (02/06/2012)  . Breast cancer 1996    BREAST CANCER -LEFT BREAST REMOVED -  1996   Past Surgical History:  Past Surgical History  Procedure Laterality Date  . Mastectomy  1995    Left  . Cystectomy  1993    Left shoulder  . Colonoscopy  11/2011  . Polypectomy  ~2005; ~2008  . Tonsillectomy  1950's  . Vaginal hysterectomy  1970  . Cardiac catheterization  02/05/2012    "first one ever" (02/06/2012)   HPI:  80 yo Female with PMH of hiatal hernia, GERD, HTN, admitted with  intermittent vague chest discomfort. She was given morphine  intravenously which was associated with transient left sided  facial and arm weakness.  Possibilities noted included  TIA  secondary to valsalva from the retching, seizure given the  preceding facial paresthesia, or medication reaction. History  present for dysphagia. MD suspects presbyesophagus and lack of  dentition as cause however aspiration not ruled out. MBS ordered.       Assessment / Plan / Recommendation Clinical Impression  Dysphagia Diagnosis: Suspected primary esophageal dysphagia Clinical impression: Patient presents with a suspected primary  esophageal dysphagia given c/o globus and lingual pumping  (reports anxiety regarding larger bolus sizes with solids)  despite an functional oropharyngeal swallow observed. Patient  with full airway protection and pharyngeal clearance.  Independently utilizing safe swallowing strategies including  intake of soft solids, small bolus sizes, and upright posture. No  SLP f/u indicated at this time.     Treatment Recommendation  No treatment recommended at this time    Diet Recommendation Dysphagia 3 (Mechanical Soft);Thin liquid   Liquid Administration via: Cup;Straw Medication  Administration: Whole meds with liquid Supervision: Patient able to self feed Compensations: Slow rate;Small sips/bites Postural Changes and/or Swallow Maneuvers: Seated upright 90  degrees;Upright 30-60 min after meal    Other  Recommendations Oral Care Recommendations: Oral care BID   Follow Up Recommendations  Bright            General HPI: 80 yo Female with PMH of hiatal hernia, GERD, HTN,  admitted with intermittent vague chest discomfort. She was given  morphine intravenously which was associated with transient left  sided facial and arm weakness.  Possibilities noted included  TIA  secondary to valsalva from the retching, seizure given the  preceding facial paresthesia, or medication reaction. History  present for dysphagia. MD suspects presbyesophagus and lack of  dentition as cause however aspiration not ruled out. MBS ordered.   Type of Study: Modified Barium Swallowing Study Reason for Referral: Objectively evaluate swallowing function Previous Swallow Assessment: Bright Diet Prior to this Study: Dysphagia 3 (soft);Thin liquids Temperature Spikes Noted: No Respiratory Status: Room air History of Recent Intubation: No Behavior/Cognition: Alert;Cooperative;Pleasant mood Oral Cavity - Dentition: Poor condition;Missing dentition Oral Motor / Sensory Function: Within functional limits Self-Feeding  Abilities: Able to feed self Patient Positioning: Upright in chair Baseline Vocal Quality: Clear Volitional Cough: Strong Volitional Swallow: Able to elicit Anatomy: Within functional limits Pharyngeal Secretions: Not observed secondary MBS    Reason for Referral Objectively evaluate swallowing function   Oral Phase Oral Preparation/Oral Phase Oral Phase: Impaired Oral - Thin Oral - Thin Cup: Lingual pumping;Piecemeal swallowing Oral - Thin Straw: Lingual pumping;Piecemeal swallowing Oral - Solids Oral - Puree: Lingual pumping;Piecemeal swallowing Oral - Mechanical Soft: Lingual pumping;Piecemeal swallowing Oral - Pill:  Lingual pumping   Pharyngeal Phase Pharyngeal Phase Pharyngeal Phase: Within functional limits  Cervical Esophageal Phase    GO   Gabriel Rainwater MA, CCC-SLP [phone]  Cervical Esophageal Phase Cervical Esophageal Phase: [facility]         McCoy Leah Meryl 03/17/2013, 12:59 PM     Labs: Lab Results  Component Value Date   WBC 11.8* 03/15/2013   HGB 11.7* 03/15/2013   HCT 35.1* 03/15/2013   MCV 90.9 03/15/2013   PLT 303 03/15/2013      Recent Labs Lab 03/15/13 1038 03/15/13 1140  NA 142  --   K 4.4  --   CL 105  --   CO2 23  --   BUN 11  --   CREATININE 0.83  --   CALCIUM 9.0  --   PROT  --  8.4*  BILITOT  --  0.4  ALKPHOS  --  81  ALT  --  21  AST  --  66*  GLUCOSE 92  --        Lab Results  Component Value Date   INR 1.08 02/05/2012   INR 1.1 03/26/2008     No results found for this or any previous visit (from the past 240 hour(s)).    Discharge Exam: Blood pressure 124/62, pulse 63, temperature 98.3 F (36.8 C), temperature source Oral, resp. rate 16, height 4\' 11"  (1.499 m), weight 66.724 kg (147 lb 1.6 oz), SpO2 98.00%.  Physical Exam: In general, she is an elderly black woman who was in no apparent distress. HEENT exam was normal, neck was supple and without JVD or bruit, chest was clear to auscultation, heart had a regular rate and rhythm, abdomen was with normal bowel sounds and no tenderness, extremities were without edema, neurologic exam: she was alert and well-oriented and had a nonfocal neurologic exam.  Disposition: She will be discharged to home tonight with her family members.       Discharge Orders   Future Appointments Provider Department Dept Phone   03/27/2013 8:00 AM Cameron Sprang, MD [facility] Neurology [facility] [phone]   Future Orders Complete By Expires   Call MD for:  As directed    Comments:     Call physician for fever, chills, severe chest pain, difficulty breathing or other concerning symptoms   Diet - low sodium heart healthy  As directed     Discharge instructions  As directed    Comments:     Take all usual meds and add ecotrin 325 mg by mouth once daily   Increase activity slowly  As directed        Medication List         amLODipine 2.5 MG tablet  Commonly known as:  NORVASC  Take 1 tablet (2.5 mg total) by mouth daily.     CENTRUM SILVER PO  Take 1 tablet by mouth daily.     cetirizine 10 MG tablet  Commonly known as:  ZYRTEC  Take 10 mg by mouth daily as needed for allergies.     CITRACAL PO  Take 1 tablet by mouth daily.     CRESTOR 5 MG tablet  Generic drug:  rosuvastatin  Take 1 tablet (5 mg total) by mouth at bedtime.     DSS 100 MG Caps  Take 100 mg by mouth daily.     famotidine 10 MG chewable tablet  Commonly known as:  PEPCID AC  Chew 10 mg by mouth daily.     folic acid 1 MG tablet  Commonly known as:  FOLVITE  Take 2 mg by mouth daily.     methotrexate 2.5 MG tablet  Commonly known as:  RHEUMATREX  Take 25 mg by mouth once a week. Takes 10 tablets on Sunday. Caution:Chemotherapy. Protect from light.     predniSONE 1 MG tablet  Commonly known as:  DELTASONE  Take 3 mg by mouth daily. Takes 3 tablets daily     Vitamin D (Ergocalciferol) 50000 UNITS Caps capsule  Commonly known as:  DRISDOL  Take 50,000 Units by mouth every 7 (seven) days. Sunday.      Ecotrin 325 mg by mouth once daily Follow-up Information   Follow up with Cameron Sprang, MD On 03/27/2013. (appointment at 7:45 am)    Specialty:  Neurology   Contact information:   [address] [address] [phone]       Follow up with Donnajean Lopes, MD. Schedule an appointment as soon as possible for a visit in 1 week.   Specialty:  Internal Medicine   Contact information:   [place] [address] [phone]       Signed: Donnajean Lopes 03/17/2013, 6:00 PM

## 2013-03-17 NOTE — Progress Notes (Addendum)
Subjective: Continues to have improvement in chest pain.   Exam: Filed Vitals:   03/17/13 0946  BP: 152/90  Pulse:   Temp:   Resp:    Gen: In bed, NAD MS: Awake, alert NAD VE:LFYBO, EOMI, face symmetric Motor: MAEW Sensory:intact to LT   Impression: 80 yo F with transient left sided facial and arm weakness. This occurred shortly after getting morphine, and also after getting nauseous and retching without emesis. Possibilities would include TIA secondary to valsalva from the retching, seizure given the preceding facial paresthesia, or medication reaction.   I am not certain what is causing the left sided chest pain. There is a small chance that this could represent focal sensory seizures but I think that this would be very unusual and without an abnormal EEG this would be very difficult to prove.   She has a normal EEG and at this time, I feel that the single episode which may or may not have been seizure is not enough to warrant long term AED therapy.    Recommendations: 1) Neurology follow up 2) Will discontinue keppra.  3) If LDL > 100, would increase statin 4) would favor continued ASA 81mg   5) will f/u echo cardiogram, but if no embolic source then no further recommendations and Neurology will sign off.   Roland Rack, MD [facility] [phone]  If 7pm- 7am, please page neurology on call at [phone].    No findings on Echo that would change management from a stroke perspective. LDL 94, ok. Would favor adding ASA 81mg  and having patient follow up with neurology Ellouise Newer 03/05 at 7:45 am  Roland Rack, MD [facility] [phone]  If 7pm- 7am, please page neurology on call at [phone].

## 2013-03-25 ENCOUNTER — Encounter: Payer: Self-pay | Admitting: Internal Medicine

## 2013-03-27 ENCOUNTER — Ambulatory Visit: Payer: Medicare Other | Admitting: Neurology

## 2013-04-04 ENCOUNTER — Ambulatory Visit (INDEPENDENT_AMBULATORY_CARE_PROVIDER_SITE_OTHER): Payer: Medicare Other | Admitting: Neurology

## 2013-04-04 ENCOUNTER — Encounter: Payer: Self-pay | Admitting: Neurology

## 2013-04-04 VITALS — BP 136/78 | HR 60 | Resp 18 | Ht 59.0 in | Wt 146.0 lb

## 2013-04-04 DIAGNOSIS — T50905A Adverse effect of unspecified drugs, medicaments and biological substances, initial encounter: Secondary | ICD-10-CM

## 2013-04-04 DIAGNOSIS — T887XXA Unspecified adverse effect of drug or medicament, initial encounter: Secondary | ICD-10-CM

## 2013-04-04 DIAGNOSIS — G459 Transient cerebral ischemic attack, unspecified: Secondary | ICD-10-CM

## 2013-04-04 NOTE — Patient Instructions (Addendum)
1. Start daily baby aspirin 81mg  2. Avoid morphine in the future 3. If symptoms recur, go to ER immediately. Call our office for any problems

## 2013-04-04 NOTE — Progress Notes (Signed)
NEUROLOGY CONSULTATION NOTE  Carol Bright MRN: [id number] DOB: 09-23-1930  Referring provider: Dr. Roland Rack Primary care provider: Dr. Leanna Battles  Reason for consult:  TIA versus seizure  Dear Dr Leonel Ramsay:  Thank you for your kind referral of Carol Bright for consultation of the above symptoms. Although her history is well known to you, please allow me to reiterate it for the purpose of our medical record.   HISTORY OF PRESENT ILLNESS: This is a very pleasant 80 year old right-handed woman with a history of hypertension, hyperlipidemia, symptomatic bradycardia status post pacemaker placement in 2014, tobacco use, rheumatoid arthritis, in her usual state of health until a few days prior to hospital admission on 03/15/13 when she started having some shortness of breath and left sided chest, back, and flank pain.  She also reported significant tenderness to touch in the affected region.  For the first few days, she had left-sided stabbing pain lasting 5 minutes or so, however that Saturday pain became worse and they went to ER where she ahd a CTA chest and CT abdomen and pelvis that were unremarkable except for a small pericardial effusion and diverticulosis/diverticulitis.  After returning from [place], she requested for pain medication and was given IV morphine.  She developed nausea and dry heaves, and recalls saying her tongue felt swollen, then has not recollection of events.  She was witnessed to have a left facial droop, fixed stare, and initially not responding.  When she started speaking, speech was mildly slurred and she recalls having a hard time talking.  She had an NIH score of 1, and was brought to CT, with note of improvement in left facial droop by the time the scan was finished.  She recalls that her tongue felt swollen and she initially could not swallow, however once on the floor she was able to swallow some applesauce.  Symptoms were felt to be either TIA versus  seizure.  She was instructed to start a daily baby aspirin but had not done this.  After hospital discharge, she saw her PCP for continued left-sided pain, and was started on a week of antiviral for shingles.  She reports the pain significantly improved with this.  She denied any clear rash or vesicles in a dermatomal pattern.  The left-sided pain is better but she continues to have a nagging pain in the back attributed to arthritis.  She denies any prior similar episodes in the past.  No prior history of focal weakness or slurred speech.  She had a syncopal episode in January 2014 at which time pacemaker was placed for symptomatic bradycardia.  She denies any headaches, diplopia, dizziness, bowel/bladder dysfunction.  She has left shoulder pain and some numbness in her left calf and foot.  She lives by herself and denies any gaps in time, olfactory/gustatory hallucinations, rising epigastric sensation, myoclonic jerks.  She had a normal birth and early development, no history of febrile convulsions, CNS infections, significant traumatic brain injury, or neurosurgical procedures.  Records and images were personally reviewed where available.  I personally reviewed head CT done on 03/15/13 which showed mild bilateral hypodensities in the white matter consistent with mild small vessel chronic ischemic changes.  Vascular structures were opacified from preceding CTA chest.  No intracranial hemorrhage, mass lesion or evidence acute infarction. She is unable to get an MRI due to pacemaker.  Her routine EEG was normal.  Laboratory Data: Lab Results  Component Value Date   WBC 11.8* 03/15/2013  HGB 11.7* 03/15/2013   HCT 35.1* 03/15/2013   MCV 90.9 03/15/2013   PLT 303 03/15/2013     Chemistry      Component Value Date/Time   NA 142 03/15/2013 1038   K 4.4 03/15/2013 1038   CL 105 03/15/2013 1038   CO2 23 03/15/2013 1038   BUN 11 03/15/2013 1038   CREATININE 0.83 03/15/2013 1038      Component Value Date/Time     CALCIUM 9.0 03/15/2013 1038   ALKPHOS 81 03/15/2013 1140   AST 66* 03/15/2013 1140   ALT 21 03/15/2013 1140   BILITOT 0.4 03/15/2013 1140     Lab Results  Component Value Date   HGBA1C 5.9* 03/16/2013   Lab Results  Component Value Date   CHOL 177 03/17/2013   HDL 62 03/17/2013   LDLCALC 94 03/17/2013   TRIG 105 03/17/2013   CHOLHDL 2.9 03/17/2013    PAST MEDICAL HISTORY: Past Medical History  Diagnosis Date  . Hypertension   . Rheumatoid arthritis(714.0)     PT IS FOLLOWED BY DR Alice  . Hyperlipidemia   . Cataract     Bil  . H/O hiatal hernia   . Diverticulosis   . Orthostatic lightheadedness   . Palpitations   . Sinus bradycardia 02/04/2012  . Syncope and collapse 02/04/2012    "first time ever" (02/06/2012)  . History of bronchitis     "occasionally" (02/06/2012)  . Dyspnea on exertion     "off and on for years; just worse recently" (02/06/2012)  . Borderline diabetes   . GERD (gastroesophageal reflux disease)   . External hemorrhoids   . Guaiac + stool 2005    "went to Dr. Henrene Pastor; took out polyps" (02/06/2012)  . Daily headache     "recently" (02/06/2012)  . Breast cancer 1996    BREAST CANCER -LEFT BREAST REMOVED -  1996    PAST SURGICAL HISTORY: Past Surgical History  Procedure Laterality Date  . Mastectomy  1995    Left  . Cystectomy  1993    Left shoulder  . Colonoscopy  11/2011  . Polypectomy  ~2005; ~2008  . Tonsillectomy  1950's  . Vaginal hysterectomy  1970  . Cardiac catheterization  02/05/2012    "first one ever" (02/06/2012)    MEDICATIONS: Current Outpatient Prescriptions on File Prior to Visit  Medication Sig Dispense Refill  . amLODipine (NORVASC) 2.5 MG tablet Take 1 tablet (2.5 mg total) by mouth daily.  30 tablet  6  . Calcium Citrate (CITRACAL PO) Take 1 tablet by mouth daily.       . cetirizine (ZYRTEC) 10 MG tablet Take 10 mg by mouth daily as needed for allergies.      Marland Kitchen docusate sodium 100 MG CAPS Take 100 mg by  mouth daily.  10 capsule    . famotidine (PEPCID AC) 10 MG chewable tablet Chew 10 mg by mouth daily.       . folic acid (FOLVITE) 1 MG tablet Take 2 mg by mouth daily.      . methotrexate (RHEUMATREX) 2.5 MG tablet Take 25 mg by mouth once a week. Takes 10 tablets on Sunday. Caution:Chemotherapy. Protect from light.      . Multiple Vitamins-Minerals (CENTRUM SILVER PO) Take 1 tablet by mouth daily.      . predniSONE (DELTASONE) 1 MG tablet Take 3 mg by mouth daily. Takes 3 tablets daily      . rosuvastatin (CRESTOR) 5 MG tablet Take 1  tablet (5 mg total) by mouth at bedtime.  30 tablet  11  . Vitamin D, Ergocalciferol, (DRISDOL) 50000 UNITS CAPS capsule Take 50,000 Units by mouth every 7 (seven) days. Sunday.      . [DISCONTINUED] simvastatin (ZOCOR) 20 MG tablet Take 1 tablet (20 mg total) by mouth every evening.  30 tablet  6   No current facility-administered medications on file prior to visit.    ALLERGIES: Allergies  Allergen Reactions  . Penicillins Hives    FAMILY HISTORY: Family History  Problem Relation Age of Onset  . Cancer Mother     LEFT BREAST REMOVED  . Heart attack Mother   . Heart disease Mother   . Breast cancer Mother     SOCIAL HISTORY: History   Social History  . Marital Status: Widowed    Spouse Name: N/A    Number of Children: N/A  . Years of Education: N/A   Occupational History  . Not on file.   Social History Main Topics  . Smoking status: Current Every Day Smoker -- 0.50 packs/day for 50 years    Types: Cigarettes  . Smokeless tobacco: Never Used     Comment: 02/06/2012 'stopped smoking 4-5 times"; offered smoking cessation materials; pt declines  . Alcohol Use: Yes     Comment: 02/06/2012 "occasional; "like a holiday"  . Drug Use: No  . Sexual Activity: No   Other Topics Concern  . Not on file   Social History Narrative  . No narrative on file    REVIEW OF SYSTEMS: Constitutional: No fevers, chills, or sweats, no generalized  fatigue, change in appetite Eyes: No visual changes, double vision, eye pain Ear, nose and throat: No hearing loss, ear pain, nasal congestion, sore throat Cardiovascular: No chest pain, palpitations Respiratory:  No shortness of breath at rest or with exertion, wheezes GastrointestinaI: No nausea, vomiting, diarrhea, abdominal pain, fecal incontinence Genitourinary:  No dysuria, urinary retention or frequency Musculoskeletal:  + neck pain, back pain Integumentary: No rash, pruritus, skin lesions Neurological: as above Psychiatric: No depression, insomnia, anxiety Endocrine: No palpitations, fatigue, diaphoresis, mood swings, change in appetite, change in weight, increased thirst Hematologic/Lymphatic:  No anemia, purpura, petechiae. Allergic/Immunologic: no itchy/runny eyes, nasal congestion, recent allergic reactions, rashes  PHYSICAL EXAM: Filed Vitals:   04/04/13 0743  BP: 136/78  Pulse: 60  Resp: 18   General: No acute distress Head:  Normocephalic/atraumatic Neck: supple, no paraspinal tenderness, full range of motion Back: No paraspinal tenderness, no rash or tenderness to palpation over the left flank Heart: regular rate and rhythm Lungs: Clear to auscultation bilaterally. Vascular: No carotid bruits. Skin/Extremities: No rash, no edema Neurological Exam: Mental status: alert and oriented to person, place, and time, no dysarthria or dysphagia, Fund of knowledge is appropriate.  Recent and remote memory are intact.  Attention and concentration are normal.    Able to name objects and repeat phrases. Cranial nerves: CN I: not tested CN II: pupils equal, round and reactive to light, visual fields intact, fundi unremarkable. CN III, IV, VI:  full range of motion, no nystagmus, no ptosis CN V: facial sensation intact CN VII: upper and lower face symmetric CN VIII: hearing intact CN IX, X: gag intact, uvula midline CN XI: sternocleidomastoid and trapezius muscles intact CN  XII: tongue midline Bulk & Tone: normal, no fasciculations. Motor: 5/5 throughout with no pronator drift, with slight limitation of movement on left UE due to left shoulder pain. Sensation: increased cold sensation on the  left UE and LE, decreased pin on the left UE and LE.  Decreased vibration up to left ankle.  Romberg test negative Deep Tendon Reflexes: +2 throughout except for absent ankle jerks bilaterally, no ankle clonus Plantar responses: downgoing bilaterally Finger to nose testing: no incoordination Gait: narrow-based and steady, able to tandem walk adequately.  IMPRESSION: This is a very pleasant 80 year old right-handed woman with vascular risk factors including hypertension, hyperlipidemia, tobacco use, symptomatic bradycardia s/p pacemaker placement, who had an episode of transient left facial droop, fixed stare and slurred speech after receiving morphine for left-sided pain.  She has no clear epilepsy risk factors, no prior similar symptoms in the past. The left-sided pain has resolved after PCP treated for shingles.  The transient symptoms occurred after morphine administration, unlikely seizure, more suggestive of medication reaction, possibly causing hypotensive/hypoperfusion event.  Discussed avoidance of morphine in the future, and agree with starting a daily baby aspirin. Continue control of vascular risk factors.  She knows to go to ER if symptoms recur.  She will follow-up in 6 months or earlier if needed.   Thank you for allowing me to participate in the care of this patient. Please do not hesitate to call for any questions or concerns.   Ellouise Newer, M.D.

## 2013-04-14 ENCOUNTER — Other Ambulatory Visit: Payer: Self-pay | Admitting: Cardiology

## 2013-04-14 MED ORDER — AMLODIPINE BESYLATE 2.5 MG PO TABS: 2.5000 mg | ORAL_TABLET | Freq: Every day | ORAL | Status: DC

## 2013-07-10 ENCOUNTER — Telehealth: Payer: Self-pay | Admitting: *Deleted

## 2013-07-10 DIAGNOSIS — R5383 Other fatigue: Secondary | ICD-10-CM

## 2013-07-10 NOTE — Telephone Encounter (Signed)
Have her come in to see PA May need labs.

## 2013-07-10 NOTE — Telephone Encounter (Signed)
Spoke with pt, she woke at 2 am with a bubbling in her right breast and SOB. She had indigestion and was belching a lot yesterday. Today she has had a tight feeling in her chest and SOB. She went to the store today and was able to do all her shopping without stopping because of SOB. She states she has been off her gabapentin for 3 weeks now and that helped the tightness in her chest before. Now she just feels tired. bp 135/70 and pulse 89 today. She has an appt 07-29-13 for a device check.  Will forward for dr Harrington Challenger review

## 2013-07-11 NOTE — Telephone Encounter (Signed)
Per dr Harrington Challenger pt will need to come to the office today for cbcd, bmp and u/a Left message for pt

## 2013-07-15 NOTE — Telephone Encounter (Signed)
Spoke with pt dtr, pt would like to wait until her follow up device appt to have lab work done.

## 2013-07-31 ENCOUNTER — Ambulatory Visit (INDEPENDENT_AMBULATORY_CARE_PROVIDER_SITE_OTHER): Payer: Medicare Other | Admitting: *Deleted

## 2013-07-31 ENCOUNTER — Encounter: Payer: Self-pay | Admitting: Internal Medicine

## 2013-07-31 DIAGNOSIS — R001 Bradycardia, unspecified: Secondary | ICD-10-CM

## 2013-07-31 DIAGNOSIS — I498 Other specified cardiac arrhythmias: Secondary | ICD-10-CM

## 2013-07-31 LAB — MDC_IDC_ENUM_SESS_TYPE_INCLINIC
Brady Statistic RV Percent Paced: 1 %
Date Time Interrogation Session: 20150709040000
Implantable Pulse Generator Serial Number: 111372
Lead Channel Impedance Value: 476 Ohm
Lead Channel Impedance Value: 604 Ohm
Lead Channel Pacing Threshold Amplitude: 0.7 V
Lead Channel Pacing Threshold Pulse Width: 0.4 ms
Lead Channel Sensing Intrinsic Amplitude: 4.3 mV
Lead Channel Setting Pacing Amplitude: 2.4 V
Lead Channel Setting Sensing Sensitivity: 2.5 mV
MDC IDC MSMT BATTERY REMAINING LONGEVITY: 149 mo
MDC IDC MSMT LEADCHNL RV PACING THRESHOLD AMPLITUDE: 0.9 V
MDC IDC MSMT LEADCHNL RV PACING THRESHOLD PULSEWIDTH: 0.4 ms
MDC IDC MSMT LEADCHNL RV SENSING INTR AMPL: 16.7 mV
MDC IDC SET LEADCHNL RA PACING AMPLITUDE: 2 V
MDC IDC SET LEADCHNL RV PACING PULSEWIDTH: 0.4 ms
MDC IDC SET ZONE DETECTION INTERVAL: 375 ms
MDC IDC STAT BRADY RA PERCENT PACED: 8 %

## 2013-07-31 NOTE — Progress Notes (Signed)
Pacemaker check in clinic. Normal device function. Thresholds, sensing, impedances consistent with previous measurements. Device programmed to maximize longevity. 3 mode switches the longest 13 seconds, - coumadin.  No high ventricular rates noted. Device programmed at appropriate safety margins. Histogram distribution appropriate for patient activity level. Device programmed to optimize intrinsic conduction. Estimated longevity 12.5 years.  Patient education completed.  ROV 6 months with Dr. Lovena Le.

## 2013-11-07 ENCOUNTER — Other Ambulatory Visit: Payer: Self-pay | Admitting: Hematology and Oncology

## 2013-12-22 ENCOUNTER — Telehealth: Payer: Self-pay

## 2013-12-22 NOTE — Telephone Encounter (Signed)
Gave Carol Bright a month supply of crestor 5 mg

## 2014-01-01 ENCOUNTER — Encounter (HOSPITAL_COMMUNITY): Payer: Self-pay | Admitting: Cardiovascular Disease

## 2014-01-26 ENCOUNTER — Ambulatory Visit (INDEPENDENT_AMBULATORY_CARE_PROVIDER_SITE_OTHER): Payer: Medicare Other | Admitting: Cardiology

## 2014-01-26 ENCOUNTER — Encounter: Payer: Self-pay | Admitting: Internal Medicine

## 2014-01-26 ENCOUNTER — Telehealth: Payer: Self-pay | Admitting: *Deleted

## 2014-01-26 ENCOUNTER — Encounter: Payer: Self-pay | Admitting: Cardiology

## 2014-01-26 ENCOUNTER — Ambulatory Visit (INDEPENDENT_AMBULATORY_CARE_PROVIDER_SITE_OTHER): Payer: Medicare Other | Admitting: *Deleted

## 2014-01-26 VITALS — BP 146/82 | HR 60 | Ht 59.0 in | Wt 142.6 lb

## 2014-01-26 DIAGNOSIS — R0789 Other chest pain: Secondary | ICD-10-CM

## 2014-01-26 DIAGNOSIS — Z720 Tobacco use: Secondary | ICD-10-CM

## 2014-01-26 DIAGNOSIS — R42 Dizziness and giddiness: Secondary | ICD-10-CM | POA: Insufficient documentation

## 2014-01-26 DIAGNOSIS — R002 Palpitations: Secondary | ICD-10-CM

## 2014-01-26 DIAGNOSIS — R001 Bradycardia, unspecified: Secondary | ICD-10-CM

## 2014-01-26 DIAGNOSIS — I10 Essential (primary) hypertension: Secondary | ICD-10-CM

## 2014-01-26 DIAGNOSIS — R079 Chest pain, unspecified: Secondary | ICD-10-CM

## 2014-01-26 LAB — MDC_IDC_ENUM_SESS_TYPE_INCLINIC
Battery Remaining Longevity: 12.5
Brady Statistic RA Percent Paced: 11 %
Brady Statistic RV Percent Paced: 1 % — CL
Implantable Pulse Generator Serial Number: 111372
Lead Channel Impedance Value: 486 Ohm
Lead Channel Impedance Value: 622 Ohm
Lead Channel Sensing Intrinsic Amplitude: 17.8 mV
Lead Channel Sensing Intrinsic Amplitude: 4 mV
Lead Channel Setting Pacing Amplitude: 2 V
Lead Channel Setting Pacing Amplitude: 2.4 V
Lead Channel Setting Pacing Pulse Width: 0.4 ms
Lead Channel Setting Sensing Sensitivity: 2.5 mV
Zone Setting Detection Interval: 375 ms

## 2014-01-26 MED ORDER — FLUTICASONE PROPIONATE 50 MCG/ACT NA SUSP: 2.0000 | Freq: Every day | NASAL | Status: DC

## 2014-01-26 MED ORDER — MECLIZINE HCL 12.5 MG PO TABS: 12.5000 mg | ORAL_TABLET | Freq: Two times a day (BID) | ORAL | Status: DC | PRN

## 2014-01-26 NOTE — Assessment & Plan Note (Addendum)
No atrial fib. On pacer interrogation.

## 2014-01-26 NOTE — Patient Instructions (Signed)
Your physician has recommended you make the following change in your medication:   START TAKING MECLIZINE 12.5 MG AS NEEDED ONCE IN AM AND ONCE IN PM  START TAKING FLONASE TWO SPRAYS ONE IN EACH NOSTRIL FOR ONE WEEK TWICE A DAY    IF PAIN STARTS TO WORSEN  CONTACT OFFICE AND WILL SCHEDULED A STRESS TEST SOONER

## 2014-01-26 NOTE — Progress Notes (Signed)
01/26/2014   PCP: Donnajean Lopes, MD   Chief Complaint  Patient presents with  . Chest Pain    Primary Cardiologist:Dr. Beckie Salts   HPI:  81 year old female with hx of cardiac cath 2014 with minimal nonobstructive disease.  Other hx of symptomatic bradycardia, syncope, status post pacemaker insertion, hypertension, and rheumatoid arthritis. I am seeing today for Dr. Lovena Le with complaints of chest pain, dizziness and feeling like she did before the pacemaker.  Her chest pain is pressure and recreated with palpation. No nausea.  Her dizziness described as room spinning occurs when lying or first getting up in the AM.  Has had mild productive cough with yellow to thick white sputum.  No wheezes. She stated post nasal drip is present causing cough.  We had pacer interrogated and she was in SR.  She has had brief episodes of tachycardia lasting seconds.  Her device has battery life of 12 years.  She has a history of dizzy spells- though this is different.  She has SOB with exertion.  Her BP was elevated at home at 187/76.  She had not been taking her BP meds.    Allergies  Allergen Reactions  . Penicillins Hives  . Morphine And Related     Current Outpatient Prescriptions  Medication Sig Dispense Refill  . alendronate (FOSAMAX) 70 MG tablet Take 70 mg by mouth once a week. Take with a full glass of water on an empty stomach.    Marland Kitchen amLODipine (NORVASC) 2.5 MG tablet Take 1 tablet (2.5 mg total) by mouth daily. 90 tablet 3  . Calcium Citrate (CITRACAL PO) Take 1 tablet by mouth daily.     . cetirizine (ZYRTEC) 10 MG tablet Take 10 mg by mouth daily as needed for allergies.    Marland Kitchen docusate sodium 100 MG CAPS Take 100 mg by mouth daily. 10 capsule   . famotidine (PEPCID AC) 10 MG chewable tablet Chew 10 mg by mouth daily.     . folic acid (FOLVITE) 1 MG tablet Take 2 mg by mouth daily.    . methotrexate (RHEUMATREX) 2.5 MG tablet Take 25 mg by mouth once a week. Takes 10  tablets on Sunday. Caution:Chemotherapy. Protect from light.    . Multiple Vitamins-Minerals (CENTRUM SILVER PO) Take 1 tablet by mouth daily.    . predniSONE (DELTASONE) 1 MG tablet Take 3 mg by mouth daily. Takes 3 tablets daily    . PROLENSA 0.07 % SOLN Place 2 drops into both eyes 3 (three) times daily.   0  . rosuvastatin (CRESTOR) 5 MG tablet Take 1 tablet (5 mg total) by mouth at bedtime. 30 tablet 11  . timolol (TIMOPTIC) 0.5 % ophthalmic solution Place 1 drop into both eyes 2 (two) times daily.   0  . Vitamin D, Ergocalciferol, (DRISDOL) 50000 UNITS CAPS capsule Take 50,000 Units by mouth every 7 (seven) days. Sunday.    . fluticasone (FLONASE) 50 MCG/ACT nasal spray Place 2 sprays into both nostrils daily. 50 g 2  . meclizine (ANTIVERT) 12.5 MG tablet Take 1 tablet (12.5 mg total) by mouth 2 (two) times daily as needed for dizziness. 20 tablet 0  . [DISCONTINUED] simvastatin (ZOCOR) 20 MG tablet Take 1 tablet (20 mg total) by mouth every evening. 30 tablet 6   No current facility-administered medications for this visit.    Past Medical History  Diagnosis Date  . Hypertension   . Rheumatoid arthritis(714.0)  PT IS FOLLOWED BY DR Lemay  . Hyperlipidemia   . Cataract     Bil  . H/O hiatal hernia   . Diverticulosis   . Orthostatic lightheadedness   . Palpitations   . Sinus bradycardia 02/04/2012  . Syncope and collapse 02/04/2012    "first time ever" (02/06/2012)  . History of bronchitis     "occasionally" (02/06/2012)  . Dyspnea on exertion     "off and on for years; just worse recently" (02/06/2012)  . Borderline diabetes   . GERD (gastroesophageal reflux disease)   . External hemorrhoids   . Guaiac + stool 2005    "went to Dr. Henrene Pastor; took out polyps" (02/06/2012)  . Daily headache     "recently" (02/06/2012)  . Breast cancer 1996    BREAST CANCER -LEFT BREAST REMOVED -  1996    Past Surgical History  Procedure Laterality Date  . Mastectomy   1995    Left  . Cystectomy  1993    Left shoulder  . Colonoscopy  11/2011  . Polypectomy  ~2005; ~2008  . Tonsillectomy  1950's  . Vaginal hysterectomy  1970  . Cardiac catheterization  02/05/2012    "first one ever" (02/06/2012)  . Left and right heart catheterization with coronary angiogram N/A 02/05/2012    Procedure: LEFT AND RIGHT HEART CATHETERIZATION WITH CORONARY ANGIOGRAM;  Surgeon: Sherren Mocha, MD;  Location: [facility] CATH LAB;  Service: Cardiovascular;  Laterality: N/A;  . Permanent pacemaker insertion N/A 02/07/2012    Procedure: PERMANENT PACEMAKER INSERTION;  Surgeon: Evans Lance, MD;  Location: [facility] CATH LAB;  Service: Cardiovascular;  Laterality: N/A;    BPZ:WCHENID:POEU colds no fevers, no weight changes Skin:no rashes or ulcers HEENT:no blurred vision, no congestion CV:see HPI PUL:see HPI GI:no diarrhea constipation or melena, no indigestion GU:no hematuria, no dysuria  MS:no joint pain, no claudication Neuro:no syncope, no lightheadedness, + room spinning Endo:no diabetes, no thyroid disease  Wt Readings from Last 3 Encounters:  01/26/14 142 lb 9.6 oz (64.683 kg)  04/04/13 146 lb (66.225 kg)  03/15/13 147 lb 1.6 oz (66.724 kg)    PHYSICAL EXAM BP 146/82 mmHg  Pulse 60  Ht 4\' 11"  (1.499 m)  Wt 142 lb 9.6 oz (64.683 kg)  BMI 28.79 kg/m2 General:Pleasant affect, NAD, except going from lying to sitting + dizzy though BP fairly stable  Skin:Warm and dry, brisk capillary refill HEENT:normocephalic, sclera clear, mucus membranes moist, TMs rt with bright light reflex, Lt slightly dull, no erythema.  Neck:supple, no JVD, no bruits  Heart:S1S2 RRR with 1/6 systolic murmur, no gallup, rub or click, significant chest wall tenderness to palpation  Lungs:clear without rales, rhonchi, or wheezes MPN:TIRW, non tender, + BS, do not palpate liver spleen or masses Ext:no lower ext edema, 2+ pedal pulses, 2+ radial pulses Neuro:alert and oriented X 3, MAE, follows commands, +  facial symmetry, smile and frown symmetrical tongue midline ERX:VQMGQQ pacing v sensing, no acute changes from 02/2013    ASSESSMENT AND PLAN Chest pain Heart cath 2014 with mild nonobstructive CAD.  We discussed lexiscan myoview but will wait for now as pain is recreated with palpation and she has Upper resp. Syndrome with dizziness.  If chest pain increases she will call and we would do test.  Otherwise she will follow up with Dr. Beckie Salts as scheduled and if symptoms continue he will order myoview.      HTN (hypertension) Uncontrolled today, has not been taking her meds. Will resume.  Mild drop in BP with sitting but still elevated.  Palpitations No atrial fib. On pacer interrogation.   Dizziness In the AM or at night only once she is up and about it improves.  Neuro exam stable.  I added flonase nasal spray for congestion and meclazine 12.5 mg at hs and once during the day if needed for dizziness.     Tobacco abuse Smokes about a half a pack per day.  Have asked her to decrease to 1 pk per 3 days.  She will try.

## 2014-01-26 NOTE — Assessment & Plan Note (Signed)
In the AM or at night only once she is up and about it improves.  Neuro exam stable.  I added flonase nasal spray for congestion and meclazine 12.5 mg at hs and once during the day if needed for dizziness.

## 2014-01-26 NOTE — Assessment & Plan Note (Signed)
Smokes about a half a pack per day.  Have asked her to decrease to 1 pk per 3 days.  She will try.

## 2014-01-26 NOTE — Assessment & Plan Note (Addendum)
Uncontrolled today, has not been taking her meds. Will resume.  Mild drop in BP with sitting but still elevated.

## 2014-01-26 NOTE — Progress Notes (Signed)
Pacemaker check in clinic w/[facility] for episodes only (N/C). 3 ATR episodes noted since 07-2013 (<1%)---max dur. 2 sec---AT noted. No ventricular high rates noted. Patient to F/U w/GT as scheduled.

## 2014-01-26 NOTE — Assessment & Plan Note (Addendum)
Heart cath 2014 with mild nonobstructive CAD.  We discussed lexiscan myoview but will wait for now as pain is recreated with palpation and she has Upper resp. Syndrome with dizziness.  If chest pain increases she will call and we would do test.  Otherwise she will follow up with Dr. Beckie Salts as scheduled and if symptoms continue he will order myoview.

## 2014-01-26 NOTE — Telephone Encounter (Signed)
Contacted by patients daughter, Shali Vesey (employee), that pt has been having intermittent chest pain, dizziness, and weakness since 12/31.  Spoke with patient she is feeling better today but for the past two weeks has experienced dizziness and chest tightness when laying and sitting. She also complains of SOB on exertion.    Pt states "I have not felt this bad since before my pacemaker was placed" so she started using her walker as she is worried about falling.  Pt also stated "head is swimming" when laying down.   Pt has not taken BP medications or check BP for past two days as she thought that might be what was making her feel this way.  Asked pt to check BP, it was 187/76 with heart rate of 67.    She said she has recently started taking Prednisone since having surgery on her eyes.   Spoke with Mickel Baas (Flex DOD) appointment made for 10:30am today and device clinic notified and will check packer during visit.

## 2014-02-03 ENCOUNTER — Encounter: Payer: Medicare Other | Admitting: Internal Medicine

## 2014-02-03 NOTE — Telephone Encounter (Signed)
If she is still feeling bad, please bring in for BP check on fri  I will see while she is getting vitals done

## 2014-02-10 ENCOUNTER — Encounter: Payer: Self-pay | Admitting: Internal Medicine

## 2014-02-10 ENCOUNTER — Ambulatory Visit (INDEPENDENT_AMBULATORY_CARE_PROVIDER_SITE_OTHER): Payer: Medicare Other | Admitting: Internal Medicine

## 2014-02-10 VITALS — HR 64 | Ht 59.0 in | Wt 145.8 lb

## 2014-02-10 DIAGNOSIS — I10 Essential (primary) hypertension: Secondary | ICD-10-CM

## 2014-02-10 DIAGNOSIS — M791 Myalgia, unspecified site: Secondary | ICD-10-CM | POA: Insufficient documentation

## 2014-02-10 DIAGNOSIS — I25701 Atherosclerosis of coronary artery bypass graft(s), unspecified, with angina pectoris with documented spasm: Secondary | ICD-10-CM

## 2014-02-10 DIAGNOSIS — R002 Palpitations: Secondary | ICD-10-CM

## 2014-02-10 DIAGNOSIS — Z720 Tobacco use: Secondary | ICD-10-CM

## 2014-02-10 DIAGNOSIS — Z950 Presence of cardiac pacemaker: Secondary | ICD-10-CM

## 2014-02-10 DIAGNOSIS — R001 Bradycardia, unspecified: Secondary | ICD-10-CM

## 2014-02-10 LAB — CBC WITH DIFFERENTIAL/PLATELET
Basophils Absolute: 0 10*3/uL (ref 0.0–0.1)
Basophils Relative: 0.4 % (ref 0.0–3.0)
EOS ABS: 0.3 10*3/uL (ref 0.0–0.7)
Eosinophils Relative: 3.6 % (ref 0.0–5.0)
HEMATOCRIT: 39.5 % (ref 36.0–46.0)
Hemoglobin: 12.6 g/dL (ref 12.0–15.0)
Lymphocytes Relative: 31.3 % (ref 12.0–46.0)
Lymphs Abs: 2.9 10*3/uL (ref 0.7–4.0)
MCHC: 31.9 g/dL (ref 30.0–36.0)
MCV: 92.9 fl (ref 78.0–100.0)
Monocytes Absolute: 0.8 10*3/uL (ref 0.1–1.0)
Monocytes Relative: 8.7 % (ref 3.0–12.0)
NEUTROS ABS: 5.1 10*3/uL (ref 1.4–7.7)
Neutrophils Relative %: 56 % (ref 43.0–77.0)
PLATELETS: 343 10*3/uL (ref 150.0–400.0)
RBC: 4.25 Mil/uL (ref 3.87–5.11)
RDW: 15.4 % (ref 11.5–15.5)
WBC: 9.1 10*3/uL (ref 4.0–10.5)

## 2014-02-10 LAB — MDC_IDC_ENUM_SESS_TYPE_INCLINIC
Brady Statistic RV Percent Paced: 1 % — CL
Date Time Interrogation Session: 20160119050000
Lead Channel Impedance Value: 471 Ohm
Lead Channel Impedance Value: 619 Ohm
Lead Channel Pacing Threshold Amplitude: 0.9 V
Lead Channel Pacing Threshold Pulse Width: 0.4 ms
Lead Channel Setting Pacing Amplitude: 2 V
Lead Channel Setting Pacing Amplitude: 2.4 V
Lead Channel Setting Pacing Pulse Width: 0.4 ms
Lead Channel Setting Sensing Sensitivity: 2.5 mV
MDC IDC MSMT LEADCHNL RA PACING THRESHOLD AMPLITUDE: 0.6 V
MDC IDC MSMT LEADCHNL RA SENSING INTR AMPL: 5.5 mV
MDC IDC MSMT LEADCHNL RV PACING THRESHOLD PULSEWIDTH: 0.4 ms
MDC IDC MSMT LEADCHNL RV SENSING INTR AMPL: 19.1 mV
MDC IDC PG SERIAL: 111372
MDC IDC SET ZONE DETECTION INTERVAL: 375 ms
MDC IDC STAT BRADY RA PERCENT PACED: 14 %

## 2014-02-10 LAB — SEDIMENTATION RATE: SED RATE: 29 mm/h — AB (ref 0–22)

## 2014-02-10 LAB — CK: CK TOTAL: 56 U/L (ref 7–177)

## 2014-02-10 NOTE — Assessment & Plan Note (Signed)
This is a new problem. Her left leg and buttock are tender to palpation. No fluctuance or swelling. I will check and ESR and CPK and CBC.

## 2014-02-10 NOTE — Assessment & Plan Note (Signed)
She is encouraged to stop smoking

## 2014-02-10 NOTE — Progress Notes (Signed)
HPI Mrs. Tindel returns today for followup. She is a pleasant 81 yo woman with symptomatic bradycardia, syncope, status post pacemaker insertion, hypertension, and rheumatoid arthritis. In the interim, she has been stable except for trouble with her left leg and hip. She denies syncope or chest pain. Her sob is stable. Her arthritis has been fairly well controlled. She has occaisional dizzy spells.  Allergies  Allergen Reactions  . Morphine And Related     Thought she had a stroke after taking this medication. Also couldn't talk.  Marland Kitchen Penicillins Hives     Current Outpatient Prescriptions  Medication Sig Dispense Refill  . alendronate (FOSAMAX) 70 MG tablet Take 70 mg by mouth once a week. Take with a full glass of water on an empty stomach.    Marland Kitchen amLODipine (NORVASC) 2.5 MG tablet Take 1 tablet (2.5 mg total) by mouth daily. 90 tablet 3  . Calcium Citrate (CITRACAL PO) Take 1 tablet by mouth daily.     . cetirizine (ZYRTEC) 10 MG tablet Take 10 mg by mouth daily as needed for allergies.    Marland Kitchen docusate sodium 100 MG CAPS Take 100 mg by mouth daily. (Patient taking differently: Take 100 mg by mouth daily as needed (constipation). ) 10 capsule   . famotidine (PEPCID AC) 10 MG chewable tablet Chew 10 mg by mouth daily.     . fluticasone (FLONASE) 50 MCG/ACT nasal spray Place 2 sprays into both nostrils daily. (Patient taking differently: Place 2 sprays into both nostrils daily as needed for allergies or rhinitis. ) 50 g 2  . folic acid (FOLVITE) 1 MG tablet Take 2 mg by mouth daily.    . meclizine (ANTIVERT) 12.5 MG tablet Take 1 tablet (12.5 mg total) by mouth 2 (two) times daily as needed for dizziness. 20 tablet 0  . methotrexate (RHEUMATREX) 2.5 MG tablet Take 25 mg by mouth once a week. Takes 10 tablets on Sunday. Caution:Chemotherapy. Protect from light.    . Multiple Vitamins-Minerals (CENTRUM SILVER PO) Take 1 tablet by mouth daily.    . predniSONE (DELTASONE) 5 MG tablet Take 1 tablet by  mouth daily.    Marland Kitchen PROLENSA 0.07 % SOLN Place 2 drops into both eyes 3 (three) times daily.   0  . rosuvastatin (CRESTOR) 5 MG tablet Take 1 tablet (5 mg total) by mouth at bedtime. 30 tablet 11  . timolol (TIMOPTIC) 0.5 % ophthalmic solution Place 1 drop into both eyes 2 (two) times daily.   0  . Vitamin D, Ergocalciferol, (DRISDOL) 50000 UNITS CAPS capsule Take 50,000 Units by mouth every 7 (seven) days. Sunday.    . [DISCONTINUED] simvastatin (ZOCOR) 20 MG tablet Take 1 tablet (20 mg total) by mouth every evening. 30 tablet 6   No current facility-administered medications for this visit.     Past Medical History  Diagnosis Date  . Hypertension   . Rheumatoid arthritis(714.0)     PT IS FOLLOWED BY DR Boqueron  . Hyperlipidemia   . Cataract     Bil  . H/O hiatal hernia   . Diverticulosis   . Orthostatic lightheadedness   . Palpitations   . Sinus bradycardia 02/04/2012  . Syncope and collapse 02/04/2012    "first time ever" (02/06/2012)  . History of bronchitis     "occasionally" (02/06/2012)  . Dyspnea on exertion     "off and on for years; just worse recently" (02/06/2012)  . Borderline diabetes   . GERD (gastroesophageal reflux disease)   .  External hemorrhoids   . Guaiac + stool 2005    "went to Dr. Henrene Pastor; took out polyps" (02/06/2012)  . Daily headache     "recently" (02/06/2012)  . Breast cancer 1996    BREAST CANCER -LEFT BREAST REMOVED -  1996    ROS:   All systems reviewed and negative except as noted in the HPI.   Past Surgical History  Procedure Laterality Date  . Mastectomy  1995    Left  . Cystectomy  1993    Left shoulder  . Colonoscopy  11/2011  . Polypectomy  ~2005; ~2008  . Tonsillectomy  1950's  . Vaginal hysterectomy  1970  . Cardiac catheterization  02/05/2012    "first one ever" (02/06/2012)  . Left and right heart catheterization with coronary angiogram N/A 02/05/2012    Procedure: LEFT AND RIGHT HEART CATHETERIZATION WITH  CORONARY ANGIOGRAM;  Surgeon: Sherren Mocha, MD;  Location: [facility] CATH LAB;  Service: Cardiovascular;  Laterality: N/A;  . Permanent pacemaker insertion N/A 02/07/2012    Procedure: PERMANENT PACEMAKER INSERTION;  Surgeon: Evans Lance, MD;  Location: [facility] CATH LAB;  Service: Cardiovascular;  Laterality: N/A;     Family History  Problem Relation Age of Onset  . Cancer Mother     LEFT BREAST REMOVED  . Heart attack Mother   . Heart disease Mother   . Breast cancer Mother      History   Social History  . Marital Status: Widowed    Spouse Name: N/A    Number of Children: N/A  . Years of Education: N/A   Occupational History  . Not on file.   Social History Main Topics  . Smoking status: Current Every Day Smoker -- 0.50 packs/day for 50 years    Types: Cigarettes  . Smokeless tobacco: Never Used     Comment: 02/06/2012 'stopped smoking 4-5 times"; offered smoking cessation materials; pt declines  . Alcohol Use: Yes     Comment: 02/06/2012 "occasional; "like a holiday"  . Drug Use: No  . Sexual Activity: No   Other Topics Concern  . Not on file   Social History Narrative     Pulse 64  Ht 4\' 11"  (1.499 m)  Wt 145 lb 12.8 oz (66.134 kg)  BMI 29.43 kg/m2  Physical Exam:  Well appearing elderly woman,NAD HEENT: Unremarkable Neck:  No JVD, no thyromegally Back:  No CVA tenderness Lungs:  Clear with scattered basilar rales, no wheezes, no rhonchi. HEART:  Regular rate rhythm, no murmurs, no rubs, no clicks Abd:  soft, positive bowel sounds, no organomegally, no rebound, no guarding Ext:  2 plus pulses, no edema, no cyanosis, no clubbing Skin:  No rashes no nodules Neuro:  CN II through XII intact, motor grossly intact   DEVICE  Normal device function.  See PaceArt for details.   Assess/Plan:

## 2014-02-10 NOTE — Patient Instructions (Signed)
Your physician wants you to follow-up in: 6 months with [facility] and 1 year with Dr. Lovena Le. You will receive a reminder letter in the mail two months in advance. If you don't receive a letter, please call our office to schedule the follow-up appointment.  Your physician recommends that you return for lab work TODAY (CPK, Sed. Rate, CBC)  Your physician recommends that you continue on your current medications as directed. Please refer to the Current Medication list given to you today.

## 2014-02-10 NOTE — Assessment & Plan Note (Signed)
Her blood pressure is elevated today but better on recheck. She states that it is worse when she hurts. She will take her amlodipine.

## 2014-02-10 NOTE — Assessment & Plan Note (Signed)
Her [employer] PPM is working normally. Will recheck in several months.

## 2014-02-10 NOTE — Assessment & Plan Note (Signed)
She denies anginal symptoms. Continue current meds.

## 2014-02-16 ENCOUNTER — Telehealth: Payer: Self-pay | Admitting: *Deleted

## 2014-02-16 ENCOUNTER — Other Ambulatory Visit: Payer: Self-pay | Admitting: *Deleted

## 2014-02-16 MED ORDER — AMLODIPINE BESYLATE 2.5 MG PO TABS: 2.5000 mg | ORAL_TABLET | Freq: Two times a day (BID) | ORAL | Status: DC

## 2014-02-16 MED ORDER — AMLODIPINE BESYLATE 5 MG PO TABS: 5.0000 mg | ORAL_TABLET | Freq: Every day | ORAL | Status: DC

## 2014-02-16 NOTE — Telephone Encounter (Signed)
Daughter Tranae Laramie reporting patient's BP elevated on recent checks. Was discussed with Dr. Harrington Challenger who rec increase amlodipine to 2.5 mg twice daily.

## 2014-02-18 ENCOUNTER — Encounter: Payer: Self-pay | Admitting: Internal Medicine

## 2014-06-28 ENCOUNTER — Encounter (HOSPITAL_COMMUNITY): Payer: Self-pay | Admitting: *Deleted

## 2014-06-28 ENCOUNTER — Emergency Department (HOSPITAL_COMMUNITY): Payer: Medicare Other

## 2014-06-28 ENCOUNTER — Emergency Department (HOSPITAL_COMMUNITY)
Admission: EM | Admit: 2014-06-28 | Discharge: 2014-06-28 | Disposition: A | Discharge status: Home / Self Care | Payer: Medicare Other | Attending: Emergency Medicine | Admitting: Emergency Medicine

## 2014-06-28 DIAGNOSIS — Z79899 Other long term (current) drug therapy: Secondary | ICD-10-CM | POA: Diagnosis not present

## 2014-06-28 DIAGNOSIS — M069 Rheumatoid arthritis, unspecified: Secondary | ICD-10-CM | POA: Diagnosis not present

## 2014-06-28 DIAGNOSIS — N390 Urinary tract infection, site not specified: Secondary | ICD-10-CM | POA: Insufficient documentation

## 2014-06-28 DIAGNOSIS — H269 Unspecified cataract: Secondary | ICD-10-CM | POA: Insufficient documentation

## 2014-06-28 DIAGNOSIS — Z7952 Long term (current) use of systemic steroids: Secondary | ICD-10-CM | POA: Diagnosis not present

## 2014-06-28 DIAGNOSIS — E785 Hyperlipidemia, unspecified: Secondary | ICD-10-CM | POA: Insufficient documentation

## 2014-06-28 DIAGNOSIS — Z8709 Personal history of other diseases of the respiratory system: Secondary | ICD-10-CM | POA: Insufficient documentation

## 2014-06-28 DIAGNOSIS — Z7951 Long term (current) use of inhaled steroids: Secondary | ICD-10-CM | POA: Insufficient documentation

## 2014-06-28 DIAGNOSIS — Z7982 Long term (current) use of aspirin: Secondary | ICD-10-CM | POA: Diagnosis not present

## 2014-06-28 DIAGNOSIS — Z950 Presence of cardiac pacemaker: Secondary | ICD-10-CM | POA: Diagnosis not present

## 2014-06-28 DIAGNOSIS — Z9889 Other specified postprocedural states: Secondary | ICD-10-CM | POA: Insufficient documentation

## 2014-06-28 DIAGNOSIS — Z853 Personal history of malignant neoplasm of breast: Secondary | ICD-10-CM | POA: Insufficient documentation

## 2014-06-28 DIAGNOSIS — Z880 Allergy status to penicillin: Secondary | ICD-10-CM | POA: Diagnosis not present

## 2014-06-28 DIAGNOSIS — I10 Essential (primary) hypertension: Secondary | ICD-10-CM | POA: Diagnosis not present

## 2014-06-28 DIAGNOSIS — K219 Gastro-esophageal reflux disease without esophagitis: Secondary | ICD-10-CM | POA: Insufficient documentation

## 2014-06-28 DIAGNOSIS — R1011 Right upper quadrant pain: Secondary | ICD-10-CM | POA: Diagnosis present

## 2014-06-28 DIAGNOSIS — Z720 Tobacco use: Secondary | ICD-10-CM | POA: Diagnosis not present

## 2014-06-28 LAB — COMPREHENSIVE METABOLIC PANEL
ALBUMIN: 4 g/dL (ref 3.5–5.0)
ALT: 13 U/L — AB (ref 14–54)
AST: 17 U/L (ref 15–41)
Alkaline Phosphatase: 80 U/L (ref 38–126)
Anion gap: 9 (ref 5–15)
BUN: 9 mg/dL (ref 6–20)
CO2: 26 mmol/L (ref 22–32)
Calcium: 9 mg/dL (ref 8.9–10.3)
Chloride: 105 mmol/L (ref 101–111)
Creatinine, Ser: 0.8 mg/dL (ref 0.44–1.00)
GFR calc Af Amer: >60 mL/min (ref >60)
Glucose, Bld: 103 mg/dL — ABNORMAL HIGH (ref 65–99)
Potassium: 3.7 mmol/L (ref 3.5–5.1)
Sodium: 140 mmol/L (ref 135–145)
TOTAL PROTEIN: 8 g/dL (ref 6.5–8.1)
Total Bilirubin: 0.2 mg/dL — ABNORMAL LOW (ref 0.3–1.2)

## 2014-06-28 LAB — LIPASE, BLOOD: LIPASE: 20 U/L — AB (ref 22–51)

## 2014-06-28 LAB — CBC WITH DIFFERENTIAL/PLATELET
BASOS PCT: 1 % (ref 0–1)
Basophils Absolute: 0.1 10*3/uL (ref 0.0–0.1)
Eosinophils Absolute: 0.4 10*3/uL (ref 0.0–0.7)
Eosinophils Relative: 3 % (ref 0–5)
HEMATOCRIT: 36.8 % (ref 36.0–46.0)
Hemoglobin: 11.9 g/dL — ABNORMAL LOW (ref 12.0–15.0)
Lymphocytes Relative: 27 % (ref 12–46)
Lymphs Abs: 3.2 10*3/uL (ref 0.7–4.0)
MCH: 30.7 pg (ref 26.0–34.0)
MCHC: 32.3 g/dL (ref 30.0–36.0)
MCV: 95.1 fL (ref 78.0–100.0)
Monocytes Absolute: 1 10*3/uL (ref 0.1–1.0)
Monocytes Relative: 8 % (ref 3–12)
NEUTROS PCT: 61 % (ref 43–77)
Neutro Abs: 7.3 10*3/uL (ref 1.7–7.7)
Platelets: 401 10*3/uL — ABNORMAL HIGH (ref 150–400)
RBC: 3.87 MIL/uL (ref 3.87–5.11)
RDW: 15.1 % (ref 11.5–15.5)
WBC: 12 10*3/uL — AB (ref 4.0–10.5)

## 2014-06-28 LAB — URINALYSIS, ROUTINE W REFLEX MICROSCOPIC
Bilirubin Urine: NEGATIVE
GLUCOSE, UA: NEGATIVE mg/dL
HGB URINE DIPSTICK: NEGATIVE
KETONES UR: NEGATIVE mg/dL
NITRITE: NEGATIVE
PROTEIN: NEGATIVE mg/dL
SPECIFIC GRAVITY, URINE: 1.009 (ref 1.005–1.030)
Urobilinogen, UA: 0.2 mg/dL (ref 0.0–1.0)
pH: 7.5 (ref 5.0–8.0)

## 2014-06-28 LAB — I-STAT TROPONIN, ED: TROPONIN I, POC: 0 ng/mL (ref 0.00–0.08)

## 2014-06-28 LAB — URINE MICROSCOPIC-ADD ON

## 2014-06-28 MED ORDER — SODIUM CHLORIDE 0.9 % IV BOLUS (SEPSIS)
500.0000 mL | Freq: Once | INTRAVENOUS | Status: AC
  Administered 2014-06-28: 500 mL via INTRAVENOUS

## 2014-06-28 MED ORDER — ONDANSETRON HCL 4 MG/2ML IJ SOLN
4.0000 mg | Freq: Once | INTRAMUSCULAR | Status: AC
  Administered 2014-06-28: 4 mg via INTRAVENOUS
  Filled 2014-06-28: qty 2

## 2014-06-28 MED ORDER — HYDROCODONE-ACETAMINOPHEN 5-325 MG PO TABS: 1.0000 | ORAL_TABLET | Freq: Four times a day (QID) | ORAL | Status: DC | PRN

## 2014-06-28 MED ORDER — HYDROMORPHONE HCL 1 MG/ML IJ SOLN
1.0000 mg | Freq: Once | INTRAMUSCULAR | Status: AC
Start: 2014-06-28 — End: 2014-06-28
  Administered 2014-06-28: 1 mg via INTRAVENOUS
  Filled 2014-06-28: qty 1

## 2014-06-28 MED ORDER — IOHEXOL 300 MG/ML  SOLN
50.0000 mL | Freq: Once | INTRAMUSCULAR | Status: AC | PRN
  Administered 2014-06-28: 50 mL via ORAL

## 2014-06-28 MED ORDER — DEXTROSE 5 % IV SOLN
1.0000 g | Freq: Once | INTRAVENOUS | Status: AC
  Administered 2014-06-28: 1 g via INTRAVENOUS

## 2014-06-28 MED ORDER — HYDROMORPHONE HCL 1 MG/ML IJ SOLN
1.0000 mg | Freq: Once | INTRAMUSCULAR | Status: AC
  Administered 2014-06-28: 1 mg via INTRAVENOUS
  Filled 2014-06-28: qty 1

## 2014-06-28 MED ORDER — CEPHALEXIN 500 MG PO CAPS: 500.0000 mg | ORAL_CAPSULE | Freq: Four times a day (QID) | ORAL | Status: DC

## 2014-06-28 MED ORDER — IOHEXOL 300 MG/ML  SOLN
100.0000 mL | Freq: Once | INTRAMUSCULAR | Status: AC | PRN
  Administered 2014-06-28: 100 mL via INTRAVENOUS

## 2014-06-28 NOTE — ED Notes (Signed)
Pt presents to ed with c/o abdominal pain x 1 week, pt sts she was constipated and was put on multiple laxatives. She sts afterwards she developed loose stools, now c/o RUQ pain and nausea.

## 2014-06-28 NOTE — ED Provider Notes (Signed)
CSN: [id number]     Arrival date & time 06/28/14  1335 History   First MD Initiated Contact with Patient 06/28/14 1506     Chief Complaint  Patient presents with  . Abdominal Pain     (Consider location/radiation/quality/duration/timing/severity/associated sxs/prior Treatment) HPI Comments: Patient is an 81 year old female with remote history of breast cancer, pacemaker, and hypertension. She presents for evaluation of upper abdominal pain that is worsened over the past week. Her pain is primarily in the right upper quadrant and radiates to her right flank. Pain started gradually and has rapidly worsened over the past several days. She has been seen by her primary doctor and was given MiraLAX and magnesium citrate without relief. She states she is now having loose stools that are nonbloody. She denies any urinary complaints.  Patient is a 81 y.o. female presenting with abdominal pain. The history is provided by the patient.  Abdominal Pain Pain location:  RUQ Pain quality: stabbing   Pain radiates to:  R flank Pain severity:  Severe Onset quality:  Sudden Duration:  1 week Timing:  Constant Progression:  Worsening Chronicity:  New Relieved by:  Nothing Worsened by:  Movement and palpation Ineffective treatments: Laxatives. Associated symptoms: no chills and no fever     Past Medical History  Diagnosis Date  . Hypertension   . Rheumatoid arthritis(714.0)     PT IS FOLLOWED BY DR [place]  . Hyperlipidemia   . Cataract     Bil  . H/O hiatal hernia   . Diverticulosis   . Orthostatic lightheadedness   . Palpitations   . Sinus bradycardia 02/04/2012  . Syncope and collapse 02/04/2012    "first time ever" (02/06/2012)  . History of bronchitis     "occasionally" (02/06/2012)  . Dyspnea on exertion     "off and on for years; just worse recently" (02/06/2012)  . Borderline diabetes   . GERD (gastroesophageal reflux disease)   . External hemorrhoids   . Guaiac +  stool 2005    "went to Dr. Henrene Pastor; took out polyps" (02/06/2012)  . Daily headache     "recently" (02/06/2012)  . Breast cancer 1996    BREAST CANCER -LEFT BREAST REMOVED -  1996   Past Surgical History  Procedure Laterality Date  . Mastectomy  1995    Left  . Cystectomy  1993    Left shoulder  . Colonoscopy  11/2011  . Polypectomy  ~2005; ~2008  . Tonsillectomy  1950's  . Vaginal hysterectomy  1970  . Cardiac catheterization  02/05/2012    "first one ever" (02/06/2012)  . Left and right heart catheterization with coronary angiogram N/A 02/05/2012    Procedure: LEFT AND RIGHT HEART CATHETERIZATION WITH CORONARY ANGIOGRAM;  Surgeon: Sherren Mocha, MD;  Location: [facility] CATH LAB;  Service: Cardiovascular;  Laterality: N/A;  . Permanent pacemaker insertion N/A 02/07/2012    Procedure: PERMANENT PACEMAKER INSERTION;  Surgeon: Evans Lance, MD;  Location: [facility] CATH LAB;  Service: Cardiovascular;  Laterality: N/A;   Family History  Problem Relation Age of Onset  . Cancer Mother     LEFT BREAST REMOVED  . Heart attack Mother   . Heart disease Mother   . Breast cancer Mother    History  Substance Use Topics  . Smoking status: Current Every Day Smoker -- 0.50 packs/day for 50 years    Types: Cigarettes  . Smokeless tobacco: Never Used     Comment: 02/06/2012 'stopped smoking 4-5 times"; offered smoking  cessation materials; pt declines  . Alcohol Use: Yes     Comment: 02/06/2012 "occasional; "like a holiday"   OB History    No data available     Review of Systems  Constitutional: Negative for fever and chills.  Gastrointestinal: Positive for abdominal pain.  All other systems reviewed and are negative.     Allergies  Morphine and related and Penicillins  Home Medications   Prior to Admission medications   Medication Sig Start Date End Date Taking? Authorizing Provider  acetaminophen (TYLENOL) 500 MG tablet Take 500 mg by mouth every 6 (six) hours as needed for moderate pain  (pain).   Yes Historical Provider, MD  alendronate (FOSAMAX) 70 MG tablet Take 70 mg by mouth once a week. On Wednesday. Take with a full glass of water on an empty stomach.   Yes Historical Provider, MD  amLODipine (NORVASC) 2.5 MG tablet Take 1 tablet (2.5 mg total) by mouth 2 (two) times daily. 02/16/14  Yes Fay Records, MD  aspirin 81 MG tablet Take 81 mg by mouth at bedtime.   Yes Historical Provider, MD  Calcium Citrate (CITRACAL PO) Take 1 tablet by mouth daily.    Yes Historical Provider, MD  cetirizine (ZYRTEC) 10 MG tablet Take 10 mg by mouth daily as needed for allergies (allergies).    Yes Historical Provider, MD  famotidine (PEPCID) 10 MG tablet Take 10 mg by mouth 2 (two) times daily.   Yes Historical Provider, MD  folic acid (FOLVITE) 1 MG tablet Take 2 mg by mouth daily.   Yes Historical Provider, MD  magnesium hydroxide (MILK OF MAGNESIA) 400 MG/5ML suspension Take 30 mLs by mouth daily as needed for mild constipation (constipation).   Yes Historical Provider, MD  meclizine (ANTIVERT) 12.5 MG tablet Take 1 tablet (12.5 mg total) by mouth 2 (two) times daily as needed for dizziness. 01/26/14  Yes Isaiah Serge, NP  Menthol-Methyl Salicylate (MUSCLE RUB) 10-15 % CREA Apply 1 application topically daily as needed for muscle pain (muscle pain).   Yes Historical Provider, MD  polyethylene glycol (MIRALAX / GLYCOLAX) packet Take 17 g by mouth 4 (four) times daily.   Yes Historical Provider, MD  predniSONE (DELTASONE) 1 MG tablet Take 3 mg by mouth daily with breakfast.   Yes Historical Provider, MD  PROLENSA 0.07 % SOLN Place 2 drops into both eyes 3 (three) times daily.  12/15/13  Yes Historical Provider, MD  rosuvastatin (CRESTOR) 5 MG tablet Take 1 tablet (5 mg total) by mouth at bedtime. 07/21/11  Yes Fay Records, MD  timolol (TIMOPTIC) 0.5 % ophthalmic solution Place 1 drop into both eyes 2 (two) times daily.  12/15/13  Yes Historical Provider, MD  docusate sodium 100 MG CAPS Take 100  mg by mouth daily. Patient taking differently: Take 100 mg by mouth daily as needed (constipation).  02/08/12   Rhonda G Barrett, PA-C  fluticasone (FLONASE) 50 MCG/ACT nasal spray Place 2 sprays into both nostrils daily. Patient taking differently: Place 2 sprays into both nostrils daily as needed for allergies or rhinitis.  01/26/14   Isaiah Serge, NP  methotrexate (RHEUMATREX) 2.5 MG tablet Take 25 mg by mouth once a week. Takes 10 tablets on Sunday. Caution:Chemotherapy. Protect from light.    Historical Provider, MD  Vitamin D, Ergocalciferol, (DRISDOL) 50000 UNITS CAPS capsule Take 50,000 Units by mouth every 7 (seven) days. Sunday.    Historical Provider, MD   BP 172/56 mmHg  Pulse 59  Temp(Src) 98.1 F (36.7 C) (Oral)  Resp 22  SpO2 100% Physical Exam  Constitutional: She is oriented to person, place, and time. She appears well-developed and well-nourished. No distress.  HENT:  Head: Normocephalic and atraumatic.  Neck: Normal range of motion. Neck supple.  Cardiovascular: Normal rate and regular rhythm.  Exam reveals no gallop and no friction rub.   No murmur heard. Pulmonary/Chest: Effort normal and breath sounds normal. No respiratory distress. She has no wheezes.  Abdominal: Soft. Bowel sounds are normal. She exhibits no distension. There is tenderness. There is no rebound and no guarding.  There is tenderness to palpation in the right upper quadrant and right flank.  Musculoskeletal: Normal range of motion.  Neurological: She is alert and oriented to person, place, and time.  Skin: Skin is warm and dry. She is not diaphoretic.  Nursing note and vitals reviewed.   ED Course  Procedures (including critical care time) Labs Review Labs Reviewed  CBC WITH DIFFERENTIAL/PLATELET - Abnormal; Notable for the following:    WBC 12.0 (*)    Hemoglobin 11.9 (*)    Platelets 401 (*)    All other components within normal limits  COMPREHENSIVE METABOLIC PANEL  LIPASE, BLOOD   URINALYSIS, ROUTINE W REFLEX MICROSCOPIC (NOT AT [facility])  I-STAT TROPOININ, ED    Imaging Review No results found.   EKG Interpretation None      MDM   Final diagnoses:  None    Patient is an 81 year old female who presents with complaints of right upper abdominal pain for the past week. She has been treated with multiple accidents by her PCP, however is not improving. She is now having loose stool. Her abdominal exam reveals tenderness in the right upper quadrant and the right lateral abdomen. Her workup reveals a white count of 12, normal LFTs and lipase, and urinalysis which is suggestive of a UTI. Her CT scan reveals no acute intra-abdominal process. She will be treated with Rocephin and Keflex for presumed pyelonephritis. She otherwise appears well, vitals are stable, and she is hemodynamically stable. The return as needed for any problems.    Veryl Speak, MD 06/28/14 [phone]

## 2014-06-28 NOTE — ED Notes (Signed)
Patient stated that she doesn't have to go at this time. (cannot give sample)

## 2014-06-28 NOTE — Discharge Instructions (Signed)
Keflex as prescribed.  Hydrocodone as prescribed as needed for pain.  Follow up with your primary doctor if not improving in the next 2 days, and return to the ER if your symptoms significantly worsen or change.   Abdominal Pain Many things can cause abdominal pain. Usually, abdominal pain is not caused by a disease and will improve without treatment. It can often be observed and treated at home. Your health care provider will do a physical exam and possibly order blood tests and X-rays to help determine the seriousness of your pain. However, in many cases, more time must pass before a clear cause of the pain can be found. Before that point, your health care provider may not know if you need more testing or further treatment. HOME CARE INSTRUCTIONS  Monitor your abdominal pain for any changes. The following actions may help to alleviate any discomfort you are experiencing:  Only take over-the-counter or prescription medicines as directed by your health care provider.  Do not take laxatives unless directed to do so by your health care provider.  Try a clear liquid diet (broth, tea, or water) as directed by your health care provider. Slowly move to a bland diet as tolerated. SEEK MEDICAL CARE IF:  You have unexplained abdominal pain.  You have abdominal pain associated with nausea or diarrhea.  You have pain when you urinate or have a bowel movement.  You experience abdominal pain that wakes you in the night.  You have abdominal pain that is worsened or improved by eating food.  You have abdominal pain that is worsened with eating fatty foods.  You have a fever. SEEK IMMEDIATE MEDICAL CARE IF:   Your pain does not go away within 2 hours.  You keep throwing up (vomiting).  Your pain is felt only in portions of the abdomen, such as the right side or the left lower portion of the abdomen.  You pass bloody or black tarry stools. MAKE SURE YOU:  Understand these instructions.    Will watch your condition.   Will get help right away if you are not doing well or get worse.  Document Released: 10/19/2004 Document Revised: 01/14/2013 Document Reviewed: 09/18/2012 [facility] Patient Information 2015 [place], [place]. This information is not intended to replace advice given to you by your health care provider. Make sure you discuss any questions you have with your health care provider.

## 2014-07-10 ENCOUNTER — Telehealth: Payer: Self-pay | Admitting: Internal Medicine

## 2014-07-10 ENCOUNTER — Other Ambulatory Visit: Payer: Self-pay | Admitting: Internal Medicine

## 2014-07-10 DIAGNOSIS — M546 Pain in thoracic spine: Secondary | ICD-10-CM

## 2014-07-10 NOTE — Telephone Encounter (Signed)
Spoke with Peter Congo and scheduled patient on 07/23/14 at 2:30 PM with Alonza Bogus, PA.

## 2014-07-14 ENCOUNTER — Ambulatory Visit
Admission: RE | Admit: 2014-07-14 | Discharge: 2014-07-14 | Disposition: A | Discharge status: Home / Self Care | Payer: Medicare Other | Source: Ambulatory Visit | Attending: Internal Medicine | Admitting: Internal Medicine

## 2014-07-14 DIAGNOSIS — M546 Pain in thoracic spine: Secondary | ICD-10-CM

## 2014-07-23 ENCOUNTER — Ambulatory Visit (INDEPENDENT_AMBULATORY_CARE_PROVIDER_SITE_OTHER): Payer: Medicare Other | Admitting: Gastroenterology

## 2014-07-23 ENCOUNTER — Encounter: Payer: Self-pay | Admitting: Gastroenterology

## 2014-07-23 VITALS — BP 142/72 | HR 72 | Ht 59.0 in | Wt 138.2 lb

## 2014-07-23 DIAGNOSIS — R1011 Right upper quadrant pain: Secondary | ICD-10-CM | POA: Diagnosis not present

## 2014-07-23 DIAGNOSIS — R112 Nausea with vomiting, unspecified: Secondary | ICD-10-CM

## 2014-07-23 DIAGNOSIS — K5900 Constipation, unspecified: Secondary | ICD-10-CM | POA: Diagnosis not present

## 2014-07-23 MED ORDER — POLYETHYLENE GLYCOL 3350 17 GM/SCOOP PO POWD: 1.0000 | Freq: Every day | ORAL | Status: DC

## 2014-07-23 NOTE — Patient Instructions (Signed)
You have been scheduled for a HIDA scan at [facility] (1st floor) on 08/11/2014. Please arrive 15 minutes prior to your scheduled appointment at  8:11BJ. Make certain not to have anything to eat or drink at least 6 hours prior to your test. Should this appointment date or time not work well for you, please call radiology scheduling at [phone].  _____________________________________________________________________ hepatobiliary (HIDA) scan is an imaging procedure used to diagnose problems in the liver, gallbladder and bile ducts. In the HIDA scan, a radioactive chemical or tracer is injected into a vein in your arm. The tracer is handled by the liver like bile. Bile is a fluid produced and excreted by your liver that helps your digestive system break down fats in the foods you eat. Bile is stored in your gallbladder and the gallbladder releases the bile when you eat a meal. A special nuclear medicine scanner (gamma camera) tracks the flow of the tracer from your liver into your gallbladder and small intestine.  During your HIDA scan  You'll be asked to change into a hospital gown before your HIDA scan begins. Your health care team will position you on a table, usually on your back. The radioactive tracer is then injected into a vein in your arm.The tracer travels through your bloodstream to your liver, where it's taken up by the bile-producing cells. The radioactive tracer travels with the bile from your liver into your gallbladder and through your bile ducts to your small intestine.You may feel some pressure while the radioactive tracer is injected into your vein. As you lie on the table, a special gamma camera is positioned over your abdomen taking pictures of the tracer as it moves through your body. The gamma camera takes pictures continually for about an hour. You'll need to keep still during the HIDA scan. This can become uncomfortable, but you may find that you can lessen the discomfort by  taking deep breaths and thinking about other things. Tell your health care team if you're uncomfortable. The radiologist will watch on a computer the progress of the radioactive tracer through your body. The HIDA scan may be stopped when the radioactive tracer is seen in the gallbladder and enters your small intestine. This typically takes about an hour. In some cases extra imaging will be performed if original images aren't satisfactory, if morphine is given to help visualize the gallbladder or if the medication CCK is given to look at the contraction of the gallbladder. This test typically takes 2 hours to complete. ________________________________________________________________________   We have sent medications to your pharmacy for you to pick up at your convenience.  Start Metamucil daily.

## 2014-07-23 NOTE — Progress Notes (Signed)
07/23/2014 Carol Bright [id number] 04/25/1930   HISTORY OF PRESENT ILLNESS:  This is a pleasant 81 year old female who is known to Dr. Henrene Pastor.  She had a colonoscopy in 11/2011 showed diverticulosis in the ascending, descending, and sigmoid colon.  She presents to our office today at the request of her PCP, Dr. Philip Aspen, with a couple of complaints.  First, she reports some constipation.  Says that she was taking colace stool softener every day so she is not sure why she got constipated.  Her PCP had her take MOM, fiber, and some other stuff to help her move her bowels.  She finally had bowel movements and but is concerned about it happening again.    Her more significant issue, however, is RUQ abdominal pain.  She says that this began about one month ago.  It comes and goes and does seem to be worse after eating, but sometimes hurts with movements as well.  She says that after she eats she feels a hard ball/knot in her RUQ when she gets the pain.  It does radiate around to the right side as well.  Gets some nausea and vomiting associated with it as well and says that she cannot eat much.  She is also complaining of a lot of back pain too, however.  She had a CT scan of the abdomen and pelvis with contrast on 06/28/2014 that showed only mild sigmoid diverticulosis, a small hiatal hernia, and a small right inguinal hernia containing only fat.  CMP and lipase were normal.  WBC count was slightly elevated at 12, but they treated her for a UTI at that time as well.   Past Medical History  Diagnosis Date  . Hypertension   . Rheumatoid arthritis(714.0)     PT IS FOLLOWED BY DR Johnson  . Hyperlipidemia   . Cataract     Bil  . H/O hiatal hernia   . Diverticulosis   . Orthostatic lightheadedness   . Palpitations   . Sinus bradycardia 02/04/2012  . Syncope and collapse 02/04/2012    "first time ever" (02/06/2012)  . History of bronchitis     "occasionally" (02/06/2012)  .  Dyspnea on exertion     "off and on for years; just worse recently" (02/06/2012)  . Borderline diabetes   . GERD (gastroesophageal reflux disease)   . External hemorrhoids   . Guaiac + stool 2005    "went to Dr. Henrene Pastor; took out polyps" (02/06/2012)  . Daily headache     "recently" (02/06/2012)  . Breast cancer 1996    BREAST CANCER -LEFT BREAST REMOVED -  1996   Past Surgical History  Procedure Laterality Date  . Mastectomy  1995    Left  . Cystectomy  1993    Left shoulder  . Colonoscopy  11/2011  . Polypectomy  ~2005; ~2008  . Tonsillectomy  1950's  . Vaginal hysterectomy  1970  . Cardiac catheterization  02/05/2012    "first one ever" (02/06/2012)  . Left and right heart catheterization with coronary angiogram N/A 02/05/2012    Procedure: LEFT AND RIGHT HEART CATHETERIZATION WITH CORONARY ANGIOGRAM;  Surgeon: Sherren Mocha, MD;  Location: [facility] CATH LAB;  Service: Cardiovascular;  Laterality: N/A;  . Permanent pacemaker insertion N/A 02/07/2012    Procedure: PERMANENT PACEMAKER INSERTION;  Surgeon: Evans Lance, MD;  Location: [facility] CATH LAB;  Service: Cardiovascular;  Laterality: N/A;    reports that she has been smoking Cigarettes.  She has a 25 pack-year smoking history. She has never used smokeless tobacco. She reports that she drinks alcohol. She reports that she does not use illicit drugs. family history includes Breast cancer in her mother; Cancer in her mother; Heart attack in her mother; Heart disease in her mother. Allergies  Allergen Reactions  . Morphine And Related     Thought she had a stroke after taking this medication. Also couldn't talk.  Marland Kitchen Penicillins Hives      Outpatient Encounter Prescriptions as of 07/23/2014  Medication Sig  . acetaminophen (TYLENOL) 500 MG tablet Take 500 mg by mouth every 6 (six) hours as needed for moderate pain (pain).  Marland Kitchen alendronate (FOSAMAX) 70 MG tablet Take 70 mg by mouth once a week. On Wednesday. Take with a full glass of water on  an empty stomach.  Marland Kitchen amLODipine (NORVASC) 2.5 MG tablet Take 1 tablet (2.5 mg total) by mouth 2 (two) times daily.  Marland Kitchen aspirin 81 MG tablet Take 81 mg by mouth at bedtime.  . Calcium Citrate (CITRACAL PO) Take 1 tablet by mouth daily.   . cetirizine (ZYRTEC) 10 MG tablet Take 10 mg by mouth daily as needed for allergies (allergies).   . docusate sodium 100 MG CAPS Take 100 mg by mouth daily. (Patient taking differently: Take 100 mg by mouth daily as needed (constipation). )  . famotidine (PEPCID) 10 MG tablet Take 10 mg by mouth 2 (two) times daily.  . fluticasone (FLONASE) 50 MCG/ACT nasal spray Place 2 sprays into both nostrils daily. (Patient taking differently: Place 2 sprays into both nostrils daily as needed for allergies or rhinitis. )  . folic acid (FOLVITE) 1 MG tablet Take 2 mg by mouth daily.  . magnesium hydroxide (MILK OF MAGNESIA) 400 MG/5ML suspension Take 30 mLs by mouth daily as needed for mild constipation (constipation).  . meclizine (ANTIVERT) 12.5 MG tablet Take 1 tablet (12.5 mg total) by mouth 2 (two) times daily as needed for dizziness.  . Menthol-Methyl Salicylate (MUSCLE RUB) 10-15 % CREA Apply 1 application topically daily as needed for muscle pain (muscle pain).  . methotrexate (RHEUMATREX) 2.5 MG tablet Take 25 mg by mouth once a week. Takes 10 tablets on Sunday. Caution:Chemotherapy. Protect from light.  . polyethylene glycol (MIRALAX / GLYCOLAX) packet Take 17 g by mouth 4 (four) times daily.  . predniSONE (DELTASONE) 1 MG tablet Take 3 mg by mouth daily with breakfast.  . rosuvastatin (CRESTOR) 5 MG tablet Take 1 tablet (5 mg total) by mouth at bedtime.  . timolol (TIMOPTIC) 0.5 % ophthalmic solution Place 1 drop into both eyes 2 (two) times daily.   . Vitamin D, Ergocalciferol, (DRISDOL) 50000 UNITS CAPS capsule Take 50,000 Units by mouth every 7 (seven) days. Sunday.  . polyethylene glycol powder (GLYCOLAX/MIRALAX) powder Take 255 g by mouth daily.  .  [DISCONTINUED] cephALEXin (KEFLEX) 500 MG capsule Take 1 capsule (500 mg total) by mouth 4 (four) times daily.  . [DISCONTINUED] HYDROcodone-acetaminophen (NORCO) 5-325 MG per tablet Take 1-2 tablets by mouth every 6 (six) hours as needed.  . [DISCONTINUED] PROLENSA 0.07 % SOLN Place 2 drops into both eyes 3 (three) times daily.    No facility-administered encounter medications on file as of 07/23/2014.     REVIEW OF SYSTEMS  : All other systems reviewed and negative except where noted in the History of Present Illness.   PHYSICAL EXAM: BP 142/72 mmHg  Pulse 72  Ht 4\' 11"  (1.499 m)  Wt 138  lb 4 oz (62.71 kg)  BMI 27.91 kg/m2 General: Well developed black female in no acute distress Head: Normocephalic and atraumatic Eyes:  Sclerae anicteric, conjunctiva pink. Ears: Normal auditory acuity Mouth:  Poor dentition; missing several teeth Lungs: Clear throughout to auscultation Heart: Regular rate and rhythm Abdomen: Soft, non-distended.  Normal bowel sounds.  Moderate epigastric and RUQ TTP without R/R/G. Musculoskeletal: Symmetrical with no gross deformities  Skin: No lesions on visible extremities Extremities: No edema  Neurological: Alert oriented x 4, grossly non-focal Psychological:  Alert and cooperative. Normal mood and affect  ASSESSMENT AND PLAN: -RUQ abdominal pain:  Several details sound like it could be gallbladder related, however, other features make me suspect nerve or musculoskeletal source of pain.  Will check HIDA scan with [facility] to rule out gallbladder source. -Constipation:  Will begin taking Miralax daily.  Can also try adding Metamucil daily to her regimen as well if needed.   CC:  Leanna Battles, MD

## 2014-07-25 NOTE — Progress Notes (Signed)
Agree with initial assessment and plans 

## 2014-08-10 ENCOUNTER — Ambulatory Visit (INDEPENDENT_AMBULATORY_CARE_PROVIDER_SITE_OTHER): Payer: Medicare Other | Admitting: *Deleted

## 2014-08-10 DIAGNOSIS — R001 Bradycardia, unspecified: Secondary | ICD-10-CM

## 2014-08-10 DIAGNOSIS — Z950 Presence of cardiac pacemaker: Secondary | ICD-10-CM

## 2014-08-10 LAB — CUP PACEART INCLINIC DEVICE CHECK
Date Time Interrogation Session: 20160718040000
Lead Channel Impedance Value: 611 Ohm
Lead Channel Pacing Threshold Amplitude: 0.8 V
Lead Channel Pacing Threshold Pulse Width: 0.4 ms
Lead Channel Pacing Threshold Pulse Width: 0.4 ms
Lead Channel Sensing Intrinsic Amplitude: 16.8 mV
Lead Channel Setting Pacing Pulse Width: 0.4 ms
MDC IDC MSMT LEADCHNL RA IMPEDANCE VALUE: 449 Ohm
MDC IDC MSMT LEADCHNL RA PACING THRESHOLD AMPLITUDE: 0.7 V
MDC IDC MSMT LEADCHNL RA SENSING INTR AMPL: 4.8 mV
MDC IDC PG SERIAL: 111372
MDC IDC SET LEADCHNL RA PACING AMPLITUDE: 2 V
MDC IDC SET LEADCHNL RV PACING AMPLITUDE: 2.4 V
MDC IDC SET LEADCHNL RV SENSING SENSITIVITY: 2.5 mV
MDC IDC SET ZONE DETECTION INTERVAL: 375 ms
MDC IDC STAT BRADY RA PERCENT PACED: 15 %
MDC IDC STAT BRADY RV PERCENT PACED: 1 % — AB

## 2014-08-10 NOTE — Progress Notes (Signed)
Pacemaker check in clinic. Normal device function. Thresholds, sensing, impedances consistent with previous measurements. Device programmed to maximize longevity. 3 ATR---longest 5sec. No high ventricular rates noted. Device programmed at appropriate safety margins. Histogram distribution appropriate for patient activity level. Device programmed to optimize intrinsic conduction. Estimated longevity 11.48yrs. ROV w/ GT in 57mo.

## 2014-08-11 ENCOUNTER — Ambulatory Visit (HOSPITAL_COMMUNITY)
Admission: RE | Admit: 2014-08-11 | Discharge: 2014-08-11 | Disposition: A | Discharge status: Home / Self Care | Payer: Medicare Other | Source: Ambulatory Visit | Attending: Gastroenterology | Admitting: Gastroenterology

## 2014-08-11 ENCOUNTER — Other Ambulatory Visit: Payer: Self-pay | Admitting: Gastroenterology

## 2014-08-11 DIAGNOSIS — K5900 Constipation, unspecified: Secondary | ICD-10-CM

## 2014-08-11 DIAGNOSIS — R112 Nausea with vomiting, unspecified: Secondary | ICD-10-CM | POA: Insufficient documentation

## 2014-08-11 DIAGNOSIS — R1011 Right upper quadrant pain: Secondary | ICD-10-CM | POA: Diagnosis present

## 2014-08-11 MED ORDER — TECHNETIUM TC 99M MEBROFENIN IV KIT
5.2900 | PACK | Freq: Once | INTRAVENOUS | Status: AC | PRN
  Administered 2014-08-11: 5.29 via INTRAVENOUS

## 2014-08-11 MED ORDER — SINCALIDE 5 MCG IJ SOLR
0.0200 ug/kg | Freq: Once | INTRAMUSCULAR | Status: AC
  Administered 2014-08-11: 1.25 ug via INTRAVENOUS

## 2014-09-02 ENCOUNTER — Encounter: Payer: Self-pay | Admitting: Internal Medicine

## 2014-10-20 ENCOUNTER — Other Ambulatory Visit: Payer: Self-pay

## 2014-10-20 ENCOUNTER — Ambulatory Visit (INDEPENDENT_AMBULATORY_CARE_PROVIDER_SITE_OTHER): Payer: Medicare Other | Admitting: Internal Medicine

## 2014-10-20 ENCOUNTER — Encounter: Payer: Self-pay | Admitting: Internal Medicine

## 2014-10-20 VITALS — BP 174/78 | HR 64 | Ht 59.0 in | Wt 140.0 lb

## 2014-10-20 DIAGNOSIS — Z950 Presence of cardiac pacemaker: Secondary | ICD-10-CM

## 2014-10-20 DIAGNOSIS — R079 Chest pain, unspecified: Secondary | ICD-10-CM

## 2014-10-20 DIAGNOSIS — R001 Bradycardia, unspecified: Secondary | ICD-10-CM | POA: Diagnosis not present

## 2014-10-20 LAB — CUP PACEART INCLINIC DEVICE CHECK
Brady Statistic RA Percent Paced: 12 %
Brady Statistic RV Percent Paced: 1 % — CL
Lead Channel Impedance Value: 436 Ohm
Lead Channel Impedance Value: 612 Ohm
Lead Channel Pacing Threshold Amplitude: 0.7 V
Lead Channel Pacing Threshold Amplitude: 1 V
Lead Channel Pacing Threshold Pulse Width: 0.4 ms
Lead Channel Sensing Intrinsic Amplitude: 5.1 mV
Lead Channel Setting Pacing Amplitude: 2.4 V
Lead Channel Setting Pacing Pulse Width: 0.4 ms
MDC IDC MSMT LEADCHNL RA PACING THRESHOLD PULSEWIDTH: 0.4 ms
MDC IDC MSMT LEADCHNL RV SENSING INTR AMPL: 17.5 mV
MDC IDC PG SERIAL: 111372
MDC IDC SESS DTM: 20160927085729
MDC IDC SET LEADCHNL RA PACING AMPLITUDE: 2 V
MDC IDC SET LEADCHNL RV SENSING SENSITIVITY: 2.5 mV
Zone Setting Detection Interval: 375 ms

## 2014-10-20 NOTE — Progress Notes (Signed)
HPI Carol Bright returns today for followup. She is a pleasant 81 yo woman with symptomatic bradycardia, syncope, status post pacemaker insertion, hypertension, and rheumatoid arthritis. In the interim, she has been stable except for trouble with left sided chest pain. She has a h/o breast CA. She denies syncope. Her sob is stable. Her arthritis has been fairly well controlled. She has occaisional dizzy spells.  Allergies  Allergen Reactions  . Morphine And Related     Thought she had a stroke after taking this medication. Also couldn't talk.  Marland Kitchen Penicillins Hives     Current Outpatient Prescriptions  Medication Sig Dispense Refill  . acetaminophen (TYLENOL) 500 MG tablet Take 500 mg by mouth every 6 (six) hours as needed for moderate pain (pain).    Marland Kitchen alendronate (FOSAMAX) 70 MG tablet Take 70 mg by mouth once a week. On Wednesday. Take with a full glass of water on an empty stomach.    Marland Kitchen amLODipine (NORVASC) 2.5 MG tablet Take 1 tablet (2.5 mg total) by mouth 2 (two) times daily. 90 tablet 3  . aspirin 81 MG tablet Take 81 mg by mouth at bedtime.    . Calcium Citrate (CITRACAL PO) Take 1 tablet by mouth daily.     . cetirizine (ZYRTEC) 10 MG tablet Take 10 mg by mouth daily as needed for allergies (allergies).     . famotidine (PEPCID) 10 MG tablet Take 10 mg by mouth 2 (two) times daily.    . folic acid (FOLVITE) 1 MG tablet Take 2 mg by mouth daily.    Marland Kitchen gabapentin (NEURONTIN) 300 MG capsule Take 1 capsule by mouth 3 (three) times daily.  0  . magnesium hydroxide (MILK OF MAGNESIA) 400 MG/5ML suspension Take 30 mLs by mouth daily as needed for mild constipation (constipation).    . meclizine (ANTIVERT) 12.5 MG tablet Take 1 tablet (12.5 mg total) by mouth 2 (two) times daily as needed for dizziness. 20 tablet 0  . Menthol-Methyl Salicylate (MUSCLE RUB) 10-15 % CREA Apply 1 application topically daily as needed for muscle pain (muscle pain).    . methotrexate (RHEUMATREX) 2.5 MG tablet Take  25 mg by mouth once a week. Takes 10 tablets on Sunday. Caution:Chemotherapy. Protect from light.    . polyethylene glycol powder (GLYCOLAX/MIRALAX) powder Take 255 g by mouth daily. 255 g 3  . predniSONE (DELTASONE) 1 MG tablet Take 3 mg by mouth daily with breakfast.    . rosuvastatin (CRESTOR) 5 MG tablet Take 1 tablet (5 mg total) by mouth at bedtime. 30 tablet 11  . timolol (TIMOPTIC) 0.5 % ophthalmic solution Place 1 drop into both eyes 2 (two) times daily.   0  . [DISCONTINUED] simvastatin (ZOCOR) 20 MG tablet Take 1 tablet (20 mg total) by mouth every evening. 30 tablet 6   No current facility-administered medications for this visit.     Past Medical History  Diagnosis Date  . Hypertension   . Rheumatoid arthritis(714.0)     PT IS FOLLOWED BY DR Spivey  . Hyperlipidemia   . Cataract     Bil  . H/O hiatal hernia   . Diverticulosis   . Orthostatic lightheadedness   . Palpitations   . Sinus bradycardia 02/04/2012  . Syncope and collapse 02/04/2012    "first time ever" (02/06/2012)  . History of bronchitis     "occasionally" (02/06/2012)  . Dyspnea on exertion     "off and on for years; just worse recently" (02/06/2012)  .  Borderline diabetes   . GERD (gastroesophageal reflux disease)   . External hemorrhoids   . Guaiac + stool 2005    "went to Dr. Henrene Pastor; took out polyps" (02/06/2012)  . Daily headache     "recently" (02/06/2012)  . Breast cancer 1996    BREAST CANCER -LEFT BREAST REMOVED -  1996    ROS:   All systems reviewed and negative except as noted in the HPI.   Past Surgical History  Procedure Laterality Date  . Mastectomy  1995    Left  . Cystectomy  1993    Left shoulder  . Colonoscopy  11/2011  . Polypectomy  ~2005; ~2008  . Tonsillectomy  1950's  . Vaginal hysterectomy  1970  . Cardiac catheterization  02/05/2012    "first one ever" (02/06/2012)  . Left and right heart catheterization with coronary angiogram N/A 02/05/2012     Procedure: LEFT AND RIGHT HEART CATHETERIZATION WITH CORONARY ANGIOGRAM;  Surgeon: Sherren Mocha, MD;  Location: [facility] CATH LAB;  Service: Cardiovascular;  Laterality: N/A;  . Permanent pacemaker insertion N/A 02/07/2012    Procedure: PERMANENT PACEMAKER INSERTION;  Surgeon: Evans Lance, MD;  Location: [facility] CATH LAB;  Service: Cardiovascular;  Laterality: N/A;     Family History  Problem Relation Age of Onset  . Cancer Mother     LEFT BREAST REMOVED  . Heart attack Mother   . Heart disease Mother   . Breast cancer Mother      Social History   Social History  . Marital Status: Widowed    Spouse Name: N/A  . Number of Children: N/A  . Years of Education: N/A   Occupational History  . Not on file.   Social History Main Topics  . Smoking status: Current Every Day Smoker -- 0.50 packs/day for 50 years    Types: Cigarettes  . Smokeless tobacco: Never Used     Comment: 02/06/2012 'stopped smoking 4-5 times"; offered smoking cessation materials; pt declines  . Alcohol Use: 0.0 oz/week    0 Standard drinks or equivalent per week     Comment: 02/06/2012 "occasional; "like a holiday"  . Drug Use: No  . Sexual Activity: No   Other Topics Concern  . Not on file   Social History Narrative     BP 174/78 mmHg  Pulse 64  Ht 4\' 11"  (1.499 m)  Wt 140 lb (63.504 kg)  BMI 28.26 kg/m2  Physical Exam:  Well appearing elderly woman,NAD HEENT: Unremarkable Neck:  No JVD, no thyromegally Back:  No CVA tenderness Lungs:  Clear with scattered basilar rales, no wheezes, no rhonchi. Chest is tender to palpation, diffusely on the left side. HEART:  Regular rate rhythm, no murmurs, no rubs, no clicks Abd:  soft, positive bowel sounds, no organomegally, no rebound, no guarding Ext:  2 plus pulses, no edema, no cyanosis, no clubbing Skin:  No rashes no nodules Neuro:  CN II through XII intact, motor grossly intact   DEVICE  Normal device function.  See PaceArt for details.    Assess/Plan:

## 2014-10-20 NOTE — Assessment & Plan Note (Signed)
Her [employer] DDD PM is working normally. Will follow.

## 2014-10-20 NOTE — Patient Instructions (Signed)
Medication Instructions:  Your physician recommends that you continue on your current medications as directed. Please refer to the Current Medication list given to you today.   Labwork: None ordered  Testing/Procedures: None ordered  Follow-Up: Your physician wants you to follow-up in: 6 months in the device clinic and 12 months with Dr Taylor You will receive a reminder letter in the mail two months in advance. If you don't receive a letter, please call our office to schedule the follow-up appointment.   Any Other Special Instructions Will Be Listed Below (If Applicable).   

## 2014-10-20 NOTE — Assessment & Plan Note (Signed)
This is non-cardiac pain, reproduced with palpation. I have asked her to followup with Dr. Sharlett Iles.

## 2014-10-20 NOTE — Assessment & Plan Note (Signed)
She is asymptomatic s/p PPM insertion. Will follow.

## 2014-11-02 ENCOUNTER — Encounter: Payer: Self-pay | Admitting: Internal Medicine

## 2014-12-18 ENCOUNTER — Emergency Department (HOSPITAL_COMMUNITY): Payer: Medicare Other

## 2014-12-18 ENCOUNTER — Observation Stay (HOSPITAL_COMMUNITY)
Admission: EM | Admit: 2014-12-18 | Discharge: 2014-12-20 | Disposition: A | Discharge status: Home / Self Care | Payer: Medicare Other | Attending: Internal Medicine | Admitting: Internal Medicine

## 2014-12-18 ENCOUNTER — Encounter (HOSPITAL_COMMUNITY): Payer: Self-pay | Admitting: Emergency Medicine

## 2014-12-18 DIAGNOSIS — K219 Gastro-esophageal reflux disease without esophagitis: Secondary | ICD-10-CM | POA: Insufficient documentation

## 2014-12-18 DIAGNOSIS — Z7983 Long term (current) use of bisphosphonates: Secondary | ICD-10-CM | POA: Insufficient documentation

## 2014-12-18 DIAGNOSIS — M546 Pain in thoracic spine: Secondary | ICD-10-CM | POA: Diagnosis not present

## 2014-12-18 DIAGNOSIS — F17210 Nicotine dependence, cigarettes, uncomplicated: Secondary | ICD-10-CM | POA: Diagnosis not present

## 2014-12-18 DIAGNOSIS — E785 Hyperlipidemia, unspecified: Secondary | ICD-10-CM | POA: Diagnosis not present

## 2014-12-18 DIAGNOSIS — Z7952 Long term (current) use of systemic steroids: Secondary | ICD-10-CM | POA: Diagnosis not present

## 2014-12-18 DIAGNOSIS — J189 Pneumonia, unspecified organism: Secondary | ICD-10-CM | POA: Diagnosis not present

## 2014-12-18 DIAGNOSIS — M545 Low back pain: Secondary | ICD-10-CM | POA: Diagnosis not present

## 2014-12-18 DIAGNOSIS — Z853 Personal history of malignant neoplasm of breast: Secondary | ICD-10-CM | POA: Insufficient documentation

## 2014-12-18 DIAGNOSIS — Z7982 Long term (current) use of aspirin: Secondary | ICD-10-CM | POA: Insufficient documentation

## 2014-12-18 DIAGNOSIS — J069 Acute upper respiratory infection, unspecified: Secondary | ICD-10-CM | POA: Insufficient documentation

## 2014-12-18 DIAGNOSIS — I10 Essential (primary) hypertension: Secondary | ICD-10-CM | POA: Diagnosis present

## 2014-12-18 DIAGNOSIS — M069 Rheumatoid arthritis, unspecified: Secondary | ICD-10-CM | POA: Insufficient documentation

## 2014-12-18 DIAGNOSIS — Z79899 Other long term (current) drug therapy: Secondary | ICD-10-CM | POA: Insufficient documentation

## 2014-12-18 DIAGNOSIS — M549 Dorsalgia, unspecified: Secondary | ICD-10-CM | POA: Diagnosis present

## 2014-12-18 LAB — CBC WITH DIFFERENTIAL/PLATELET
BASOS PCT: 1 %
Basophils Absolute: 0.1 10*3/uL (ref 0.0–0.1)
EOS ABS: 0.2 10*3/uL (ref 0.0–0.7)
Eosinophils Relative: 4 %
HEMATOCRIT: 35.9 % — AB (ref 36.0–46.0)
HEMOGLOBIN: 11.3 g/dL — AB (ref 12.0–15.0)
Lymphocytes Relative: 43 %
Lymphs Abs: 2.7 10*3/uL (ref 0.7–4.0)
MCH: 29.8 pg (ref 26.0–34.0)
MCHC: 31.5 g/dL (ref 30.0–36.0)
MCV: 94.7 fL (ref 78.0–100.0)
MONOS PCT: 5 %
Monocytes Absolute: 0.3 10*3/uL (ref 0.1–1.0)
NEUTROS ABS: 3 10*3/uL (ref 1.7–7.7)
NEUTROS PCT: 47 %
Platelets: 342 10*3/uL (ref 150–400)
RBC: 3.79 MIL/uL — AB (ref 3.87–5.11)
RDW: 14.5 % (ref 11.5–15.5)
WBC: 6.2 10*3/uL (ref 4.0–10.5)

## 2014-12-18 LAB — URINALYSIS, ROUTINE W REFLEX MICROSCOPIC
Bilirubin Urine: NEGATIVE
GLUCOSE, UA: NEGATIVE mg/dL
Hgb urine dipstick: NEGATIVE
Ketones, ur: 15 mg/dL — AB
Nitrite: NEGATIVE
Protein, ur: NEGATIVE mg/dL
SPECIFIC GRAVITY, URINE: 1.012 (ref 1.005–1.030)
pH: 7 (ref 5.0–8.0)

## 2014-12-18 LAB — BASIC METABOLIC PANEL
ANION GAP: 9 (ref 5–15)
BUN: 7 mg/dL (ref 6–20)
CHLORIDE: 112 mmol/L — AB (ref 101–111)
CO2: 21 mmol/L — AB (ref 22–32)
Calcium: 8.5 mg/dL — ABNORMAL LOW (ref 8.9–10.3)
Creatinine, Ser: 0.8 mg/dL (ref 0.44–1.00)
GFR calc non Af Amer: >60 mL/min (ref >60)
Glucose, Bld: 91 mg/dL (ref 65–99)
POTASSIUM: 3.7 mmol/L (ref 3.5–5.1)
SODIUM: 142 mmol/L (ref 135–145)

## 2014-12-18 LAB — URINE MICROSCOPIC-ADD ON: RBC / HPF: NONE SEEN RBC/hpf (ref 0–5)

## 2014-12-18 MED ORDER — ONDANSETRON HCL 4 MG/2ML IJ SOLN
4.0000 mg | Freq: Once | INTRAMUSCULAR | Status: AC
  Administered 2014-12-18: 4 mg via INTRAVENOUS
  Filled 2014-12-18: qty 2

## 2014-12-18 MED ORDER — HYDROMORPHONE HCL 1 MG/ML IJ SOLN
0.5000 mg | Freq: Once | INTRAMUSCULAR | Status: AC
  Administered 2014-12-18: 0.5 mg via INTRAVENOUS
  Filled 2014-12-18: qty 1

## 2014-12-18 MED ORDER — ASPIRIN EC 81 MG PO TBEC
81.0000 mg | DELAYED_RELEASE_TABLET | Freq: Every day | ORAL | Status: DC
  Administered 2014-12-18 – 2014-12-19 (×2): 81 mg via ORAL
  Filled 2014-12-18 (×2): qty 1

## 2014-12-18 MED ORDER — PREDNISONE 1 MG PO TABS
3.0000 mg | ORAL_TABLET | Freq: Every day | ORAL | Status: DC
  Administered 2014-12-19 – 2014-12-20 (×2): 3 mg via ORAL
  Filled 2014-12-18 (×3): qty 3

## 2014-12-18 MED ORDER — DEXTROSE 5 % IV SOLN
500.0000 mg | Freq: Once | INTRAVENOUS | Status: AC
  Administered 2014-12-18: 500 mg via INTRAVENOUS
  Filled 2014-12-18: qty 500

## 2014-12-18 MED ORDER — HYDROMORPHONE HCL 1 MG/ML IJ SOLN: 0.5000 mg | Freq: Once | INTRAMUSCULAR | Status: DC

## 2014-12-18 MED ORDER — ENSURE ENLIVE PO LIQD
237.0000 mL | Freq: Two times a day (BID) | ORAL | Status: DC
  Administered 2014-12-19 (×2): 237 mL via ORAL

## 2014-12-18 MED ORDER — HYDROMORPHONE HCL 1 MG/ML IJ SOLN
1.0000 mg | Freq: Once | INTRAMUSCULAR | Status: AC
  Administered 2014-12-18: 1 mg via INTRAVENOUS
  Filled 2014-12-18: qty 1

## 2014-12-18 MED ORDER — HEPARIN SODIUM (PORCINE) 5000 UNIT/ML IJ SOLN
5000.0000 [IU] | Freq: Three times a day (TID) | INTRAMUSCULAR | Status: DC
  Administered 2014-12-18 – 2014-12-20 (×5): 5000 [IU] via SUBCUTANEOUS
  Filled 2014-12-18 (×5): qty 1

## 2014-12-18 MED ORDER — DIAZEPAM 5 MG/ML IJ SOLN
2.5000 mg | Freq: Once | INTRAMUSCULAR | Status: AC
  Administered 2014-12-18: 2.5 mg via INTRAVENOUS
  Filled 2014-12-18: qty 2

## 2014-12-18 MED ORDER — ACETAMINOPHEN 325 MG PO TABS: 650.0000 mg | ORAL_TABLET | Freq: Four times a day (QID) | ORAL | Status: DC | PRN

## 2014-12-18 MED ORDER — LEVOFLOXACIN 500 MG PO TABS
500.0000 mg | ORAL_TABLET | Freq: Every day | ORAL | Status: DC
  Administered 2014-12-19: 500 mg via ORAL
  Filled 2014-12-18: qty 1

## 2014-12-18 MED ORDER — SODIUM CHLORIDE 0.9 % IV SOLN
INTRAVENOUS | Status: DC
  Administered 2014-12-18: 11:00:00 via INTRAVENOUS

## 2014-12-18 MED ORDER — ROSUVASTATIN CALCIUM 5 MG PO TABS: 5.0000 mg | ORAL_TABLET | ORAL | Status: DC

## 2014-12-18 MED ORDER — POLYETHYLENE GLYCOL 3350 17 G PO PACK
17.0000 g | PACK | ORAL | Status: DC
  Administered 2014-12-20: 17 g via ORAL
  Filled 2014-12-18: qty 1

## 2014-12-18 MED ORDER — MECLIZINE HCL 12.5 MG PO TABS
12.5000 mg | ORAL_TABLET | Freq: Two times a day (BID) | ORAL | Status: DC | PRN
  Filled 2014-12-18: qty 1

## 2014-12-18 MED ORDER — OXYCODONE HCL 5 MG PO TABS
5.0000 mg | ORAL_TABLET | ORAL | Status: DC | PRN
  Administered 2014-12-18 – 2014-12-20 (×7): 5 mg via ORAL
  Filled 2014-12-18 (×7): qty 1

## 2014-12-18 MED ORDER — ACETAMINOPHEN 650 MG RE SUPP: 650.0000 mg | Freq: Four times a day (QID) | RECTAL | Status: DC | PRN

## 2014-12-18 MED ORDER — AMLODIPINE BESYLATE 2.5 MG PO TABS
2.5000 mg | ORAL_TABLET | Freq: Two times a day (BID) | ORAL | Status: DC
  Administered 2014-12-18 – 2014-12-20 (×4): 2.5 mg via ORAL
  Filled 2014-12-18 (×4): qty 1

## 2014-12-18 MED ORDER — FAMOTIDINE 20 MG PO TABS
10.0000 mg | ORAL_TABLET | Freq: Two times a day (BID) | ORAL | Status: DC
  Administered 2014-12-18 – 2014-12-20 (×4): 10 mg via ORAL
  Filled 2014-12-18 (×4): qty 1

## 2014-12-18 MED ORDER — HYDROMORPHONE HCL 1 MG/ML IJ SOLN
0.5000 mg | INTRAMUSCULAR | Status: DC | PRN
  Administered 2014-12-19: 0.5 mg via INTRAVENOUS
  Filled 2014-12-18: qty 1

## 2014-12-18 MED ORDER — DEXTROSE 5 % IV SOLN
1.0000 g | Freq: Once | INTRAVENOUS | Status: AC
  Administered 2014-12-18: 1 g via INTRAVENOUS
  Filled 2014-12-18: qty 10

## 2014-12-18 MED ORDER — METHOTREXATE 2.5 MG PO TABS: 25.0000 mg | ORAL_TABLET | ORAL | Status: DC

## 2014-12-18 MED ORDER — TIMOLOL MALEATE 0.5 % OP SOLN
1.0000 [drp] | Freq: Two times a day (BID) | OPHTHALMIC | Status: DC
  Administered 2014-12-18 – 2014-12-20 (×4): 1 [drp] via OPHTHALMIC
  Filled 2014-12-18: qty 5

## 2014-12-18 MED ORDER — OXYCODONE-ACETAMINOPHEN 5-325 MG PO TABS
1.0000 | ORAL_TABLET | Freq: Once | ORAL | Status: AC
  Administered 2014-12-18: 1 via ORAL
  Filled 2014-12-18: qty 1

## 2014-12-18 MED ORDER — METHOCARBAMOL 500 MG PO TABS
500.0000 mg | ORAL_TABLET | Freq: Four times a day (QID) | ORAL | Status: DC | PRN
  Administered 2014-12-18: 500 mg via ORAL
  Filled 2014-12-18: qty 1

## 2014-12-18 MED ORDER — FOLIC ACID 1 MG PO TABS
2.0000 mg | ORAL_TABLET | Freq: Every day | ORAL | Status: DC
  Administered 2014-12-19 – 2014-12-20 (×2): 2 mg via ORAL
  Filled 2014-12-18 (×2): qty 2

## 2014-12-18 NOTE — ED Notes (Signed)
Attempted to call report x 1  

## 2014-12-18 NOTE — ED Notes (Addendum)
PA attempted to ambulate patient in room, patient unable to complete due to pain.  Patient returned to bed with assistance.

## 2014-12-18 NOTE — ED Provider Notes (Signed)
Care assumed by Delos Haring, PA-C at 4:40 PM. Please see her note for HPI, ROS, PE and initial workup.  Briefly, patient is an 81 yo F here with back pain, acute on chronic. CT 2 weeks ago negative. Has pacemaker so cannot get MRI. XRs here with no acute changes. Dilaudid x 2 not working here. Hospitalist called for pain control and admission.   Abx given for cough x 2 weeks with phlegm and retrocardiac opacity.   Will give percocet, ambulate for pain tolerance. Dr. Waldron Labs aware.  Pain not improved with PO meds. Still unable to ambulate or stand unassisted due to pain. Will admit for pain control.  Discussed with Dr. Dayna Barker.  Gustavus Bryant, MD 12/19/14 [id number]  Merrily Pew, MD 12/19/14 [phone]

## 2014-12-18 NOTE — ED Provider Notes (Signed)
CSN: [id number]     Arrival date & time 12/18/14  [id number] History   First MD Initiated Contact with Patient 12/18/14 1042     Chief Complaint  Patient presents with  . Back Pain     (Consider location/radiation/quality/duration/timing/severity/associated sxs/prior Treatment) HPI  Patient to the ER with complaint of back pain. She has a PACE MAKER managed by Dr. [name] and her PCP is Dr. Sharlett Iles. She has hx of RA with known chronic disc compression fractures and hx of breast cancer. She takes Fosamax, calcium, gabapentin, methotrexate, and prednisone for treatments of her pain. These are the medications she has been trying at home. She was seen two weeks ago for back pain and had CT scans done on her, unsure of cervical, thoracic or lumbar.   Yesterday during Thanksgiving she was more active then normal. Her most recent instruction is rest and not to bend twist or lift heavy items. She reached up to grab something and immediately felt severe pain in her low back. She has been hunched over to walk ever since. She made it throughout the day in pain but today is in severe pain still and unable to stand up straight. Denies urinary symptoms. Denies CP, abdominal pain, bowel incontinence, fevers, SOB, headaches, confusion, weakness.  Patient has also been coughing for two weeks at home and self treating with Delsym.  Past Medical History  Diagnosis Date  . Hypertension   . Rheumatoid arthritis(714.0)     PT IS FOLLOWED BY DR [place]  . Hyperlipidemia   . Cataract     Bil  . H/O hiatal hernia   . Diverticulosis   . Orthostatic lightheadedness   . Palpitations   . Sinus bradycardia 02/04/2012  . Syncope and collapse 02/04/2012    "first time ever" (02/06/2012)  . History of bronchitis     "occasionally" (02/06/2012)  . Dyspnea on exertion     "off and on for years; just worse recently" (02/06/2012)  . Borderline diabetes   . GERD (gastroesophageal reflux disease)    . External hemorrhoids   . Guaiac + stool 2005    "went to Dr. Henrene Pastor; took out polyps" (02/06/2012)  . Daily headache     "recently" (02/06/2012)  . Breast cancer ([place]) 1996    BREAST CANCER -LEFT BREAST REMOVED -  1996   Past Surgical History  Procedure Laterality Date  . Mastectomy  1995    Left  . Cystectomy  1993    Left shoulder  . Colonoscopy  11/2011  . Polypectomy  ~2005; ~2008  . Tonsillectomy  1950's  . Vaginal hysterectomy  1970  . Cardiac catheterization  02/05/2012    "first one ever" (02/06/2012)  . Left and right heart catheterization with coronary angiogram N/A 02/05/2012    Procedure: LEFT AND RIGHT HEART CATHETERIZATION WITH CORONARY ANGIOGRAM;  Surgeon: Sherren Mocha, MD;  Location: [facility] CATH LAB;  Service: Cardiovascular;  Laterality: N/A;  . Permanent pacemaker insertion N/A 02/07/2012    Procedure: PERMANENT PACEMAKER INSERTION;  Surgeon: Evans Lance, MD;  Location: [facility] CATH LAB;  Service: Cardiovascular;  Laterality: N/A;   Family History  Problem Relation Age of Onset  . Cancer Mother     LEFT BREAST REMOVED  . Heart attack Mother   . Heart disease Mother   . Breast cancer Mother    Social History  Substance Use Topics  . Smoking status: Current Every Day Smoker -- 0.50 packs/day for 50 years  Types: Cigarettes  . Smokeless tobacco: Never Used     Comment: 02/06/2012 'stopped smoking 4-5 times"; offered smoking cessation materials; pt declines  . Alcohol Use: 0.0 oz/week    0 Standard drinks or equivalent per week     Comment: 02/06/2012 "occasional; "like a holiday"   OB History    No data available     Review of Systems  Review of Systems  Gen: no weight loss, fevers, chills, night sweats  Eyes: no occular draining, occular pain,  No visual changes  Nose: no epistaxis or rhinorrhea  Mouth: no dental pain, no sore throat  Neck: no neck pain  Lungs: No hemoptysis. No wheezing or coughing CV:  No palpitations, dependent edema or orthopnea.  No chest pain Abd: no diarrhea. No nausea or vomiting, No abdominal pain  GU: no dysuria or gross hematuria  MSK:  No muscle weakness   + back pain Neuro: no headache, no focal neurologic deficits  Skin: no rash , no wounds Psyche: no complaints of depression or anxiety  Allergies  Morphine and related and Penicillins  Home Medications   Prior to Admission medications   Medication Sig Start Date End Date Taking? Authorizing Provider  acetaminophen (TYLENOL) 500 MG tablet Take 500 mg by mouth every 6 (six) hours as needed for moderate pain (pain).   Yes Historical Provider, MD  alendronate (FOSAMAX) 70 MG tablet Take 70 mg by mouth once a week. On Wednesday. Take with a full glass of water on an empty stomach.   Yes Historical Provider, MD  amLODipine (NORVASC) 2.5 MG tablet Take 1 tablet (2.5 mg total) by mouth 2 (two) times daily. 02/16/14  Yes Fay Records, MD  aspirin 81 MG tablet Take 81 mg by mouth at bedtime.   Yes Historical Provider, MD  Calcium Citrate (CITRACAL PO) Take 1 tablet by mouth daily.    Yes Historical Provider, MD  cetirizine (ZYRTEC) 10 MG tablet Take 10 mg by mouth daily as needed for allergies (allergies).    Yes Historical Provider, MD  famotidine (PEPCID) 10 MG tablet Take 10 mg by mouth 2 (two) times daily.   Yes Historical Provider, MD  folic acid (FOLVITE) 1 MG tablet Take 2 mg by mouth daily.   Yes Historical Provider, MD  gabapentin (NEURONTIN) 300 MG capsule Take 1 capsule by mouth 3 (three) times daily. 08/14/14  Yes Historical Provider, MD  meclizine (ANTIVERT) 12.5 MG tablet Take 1 tablet (12.5 mg total) by mouth 2 (two) times daily as needed for dizziness. 01/26/14  Yes Isaiah Serge, NP  Menthol-Methyl Salicylate (MUSCLE RUB) 10-15 % CREA Apply 1 application topically daily as needed for muscle pain (muscle pain).   Yes Historical Provider, MD  methotrexate (RHEUMATREX) 2.5 MG tablet Take 25 mg by mouth once a week. Takes 10 tablets on Sunday.  Caution:Chemotherapy. Protect from light.   Yes Historical Provider, MD  polyethylene glycol (MIRALAX / GLYCOLAX) packet Take 17 g by mouth every other day.   Yes Historical Provider, MD  predniSONE (DELTASONE) 1 MG tablet Take 3 mg by mouth daily with breakfast.   Yes Historical Provider, MD  rosuvastatin (CRESTOR) 5 MG tablet Take 5 mg by mouth 2 (two) times a week.  07/21/11  Yes Fay Records, MD  timolol (TIMOPTIC) 0.5 % ophthalmic solution Place 1 drop into both eyes 2 (two) times daily.  12/15/13  Yes Historical Provider, MD  magnesium hydroxide (MILK OF MAGNESIA) 400 MG/5ML suspension Take 30 mLs by  mouth daily as needed for mild constipation (constipation).    Historical Provider, MD   BP 159/67 mmHg  Pulse 59  Temp(Src) 98.5 F (36.9 C) (Oral)  Resp 16  SpO2 97% Physical Exam  Constitutional: She appears well-developed and well-nourished. No distress.  HENT:  Head: Normocephalic and atraumatic.  Eyes: Pupils are equal, round, and reactive to light.  Neck: Normal range of motion. Neck supple.  Cardiovascular: Normal rate and regular rhythm.   Pulmonary/Chest: Effort normal.  Coughing on exam. No rhonchi, rales or wheezes noted. White phlegm produced.  Abdominal: Soft.  Musculoskeletal:  Symmetrical and physiologic strength to bilateral lower extremities. Able to lift her legs up 6 inches high before pain starts Neurosensory function adequate to both legs Skin color is normal. Skin is warm and moist.  No step off deformity appreciated and no midline bony tenderness.  Ambulatory, but bending forward due to pain.  No crepitus, laceration, effusion, induration, lesions Pedal pulses are symmetrical and palpable bilaterally  Tenderness to palpation of paraspinal and midline of spine, of left low lumbar region. No clonus on dorsiflextion  Neurological: She is alert.  Skin: Skin is warm and dry.  Nursing note and vitals reviewed.  ED Course  Procedures (including critical care  time) Labs Review Labs Reviewed  URINALYSIS, ROUTINE W REFLEX MICROSCOPIC (NOT AT [facility]) - Abnormal; Notable for the following:    Ketones, ur 15 (*)    Leukocytes, UA TRACE (*)    All other components within normal limits  URINE MICROSCOPIC-ADD ON - Abnormal; Notable for the following:    Squamous Epithelial / LPF 0-5 (*)    Bacteria, UA FEW (*)    All other components within normal limits  CBC WITH DIFFERENTIAL/PLATELET - Abnormal; Notable for the following:    RBC 3.79 (*)    Hemoglobin 11.3 (*)    HCT 35.9 (*)    All other components within normal limits  BASIC METABOLIC PANEL - Abnormal; Notable for the following:    Chloride 112 (*)    CO2 21 (*)    Calcium 8.5 (*)    All other components within normal limits    Imaging Review Dg Chest 2 View  12/18/2014  CLINICAL DATA:  Left lower flank pain. EXAM: CHEST  2 VIEW COMPARISON:  03/15/2013 FINDINGS: Right-sided pacemaker unchanged. Lungs are adequately inflated with mild opacification over the right midlung and left retrocardiac region as cannot exclude atelectasis versus infection. No evidence of effusion. Stable cardiomegaly. Degenerative change of the spine and shoulders. IMPRESSION: Mild opacification over the right midlung and left retrocardiac region which may be due to atelectasis versus infection. Stable cardiomegaly. Electronically Signed   By: Marin Olp M.D.   On: 12/18/2014 15:31   Dg Thoracic Spine 2 View  12/18/2014  CLINICAL DATA:  Mid to lower back pain, slightly worse on the left side. EXAM: THORACIC SPINE 2 VIEWS ; LUMBAR SPINE - COMPLETE 4+ VIEW COMPARISON:  CT of the thoracic spine dated 07/14/2014 FINDINGS: There is no evidence of thoracic whole lumbosacral spine fracture. There are 5 non-rib-bearing vertebral bodies with transitional anatomy of L5 vertebral body. There is exaggerated thoracic kyphosis, likely related to moderate to severe osteoarthritic changes of the lower thoracic spine. Multilevel  osteoarthritic changes are also seen in the lumbosacral spine. No other significant bone abnormalities are identified. Vascular calcifications are noted. Partially visualized cardiac pacemaker leads are seen. IMPRESSION: No evidence of fracture or subluxation of the thoracic or lumbosacral spine. Osteoarthritic changes of  the lower thoracic and lumbosacral spine. Electronically Signed   By: Fidela Salisbury M.D.   On: 12/18/2014 13:39   Dg Lumbar Spine Complete  12/18/2014  CLINICAL DATA:  Mid to lower back pain, slightly worse on the left side. EXAM: THORACIC SPINE 2 VIEWS ; LUMBAR SPINE - COMPLETE 4+ VIEW COMPARISON:  CT of the thoracic spine dated 07/14/2014 FINDINGS: There is no evidence of thoracic whole lumbosacral spine fracture. There are 5 non-rib-bearing vertebral bodies with transitional anatomy of L5 vertebral body. There is exaggerated thoracic kyphosis, likely related to moderate to severe osteoarthritic changes of the lower thoracic spine. Multilevel osteoarthritic changes are also seen in the lumbosacral spine. No other significant bone abnormalities are identified. Vascular calcifications are noted. Partially visualized cardiac pacemaker leads are seen. IMPRESSION: No evidence of fracture or subluxation of the thoracic or lumbosacral spine. Osteoarthritic changes of the lower thoracic and lumbosacral spine. Electronically Signed   By: Fidela Salisbury M.D.   On: 12/18/2014 13:39   I have personally reviewed and evaluated these images and lab results as part of my medical decision-making.   EKG Interpretation None      MDM   Final diagnoses:  Intractable back pain  URI (upper respiratory infection)    Medications  0.9 %  sodium chloride infusion ( Intravenous New Bag/Given 12/18/14 1129)  cefTRIAXone (ROCEPHIN) 1 g in dextrose 5 % 50 mL IVPB (not administered)  azithromycin (ZITHROMAX) 500 mg in dextrose 5 % 250 mL IVPB (not administered)  HYDROmorphone (DILAUDID)  injection 0.5 mg (0.5 mg Intravenous Given 12/18/14 1130)  ondansetron (ZOFRAN) injection 4 mg (4 mg Intravenous Given 12/18/14 1130)  HYDROmorphone (DILAUDID) injection 1 mg (1 mg Intravenous Given 12/18/14 1242)  diazepam (VALIUM) injection 2.5 mg (2.5 mg Intravenous Given 12/18/14 1359)   The patient was feeling significantly better after medications given through IV for pain. I attempted to stand her up and ambulate but she was having severe pain and unable to stand. She felt dizzy and then began to cough up forcefully  White thick sputum.  Per Dr. Tomi Bamberger, J. Who has seen patient as well, feels that admission for pain management and possible lung infection. Patient and family member are agreeable to admission.  Filed Vitals:   12/18/14 1415 12/18/14 1445  BP: 130/75 159/67  Pulse: 81 59  Temp:    Resp:      4: 36 pm  I spoke with Dr. Waldron [facility] regarding patient. He does not feel that her pain has been adequately controlled and refuses admission at this time. He requests that I give her  Oral Percocet and then try to ambulate her again. If she is still unable to ambulate then he will accept the admission.  At end of shift patient hand off to Dr. Rafael Bihari, ER resident.     Delos Haring, PA-C 12/18/14 1642  Dorie Rank, MD 12/19/14 [id number]

## 2014-12-18 NOTE — H&P (Signed)
Patient Demographics  Carol Bright, is a 81 y.o. female  MRN: [id number]   DOB - May 12, 1930  Admit Date - 12/18/2014  Outpatient Primary MD for the patient is Donnajean Lopes, MD   With History of -  Past Medical History  Diagnosis Date  . Hypertension   . Rheumatoid arthritis(714.0)     PT IS FOLLOWED BY DR Silt  . Hyperlipidemia   . Cataract     Bil  . H/O hiatal hernia   . Diverticulosis   . Orthostatic lightheadedness   . Palpitations   . Sinus bradycardia 02/04/2012  . Syncope and collapse 02/04/2012    "first time ever" (02/06/2012)  . History of bronchitis     "occasionally" (02/06/2012)  . Dyspnea on exertion     "off and on for years; just worse recently" (02/06/2012)  . Borderline diabetes   . GERD (gastroesophageal reflux disease)   . External hemorrhoids   . Guaiac + stool 2005    "went to Dr. Henrene Pastor; took out polyps" (02/06/2012)  . Daily headache     "recently" (02/06/2012)  . Breast cancer ([place]) 1996    BREAST CANCER -LEFT BREAST REMOVED -  1996      Past Surgical History  Procedure Laterality Date  . Mastectomy  1995    Left  . Cystectomy  1993    Left shoulder  . Colonoscopy  11/2011  . Polypectomy  ~2005; ~2008  . Tonsillectomy  1950's  . Vaginal hysterectomy  1970  . Cardiac catheterization  02/05/2012    "first one ever" (02/06/2012)  . Left and right heart catheterization with coronary angiogram N/A 02/05/2012    Procedure: LEFT AND RIGHT HEART CATHETERIZATION WITH CORONARY ANGIOGRAM;  Surgeon: Sherren Mocha, MD;  Location: [facility] CATH LAB;  Service: Cardiovascular;  Laterality: N/A;  . Permanent pacemaker insertion N/A 02/07/2012    Procedure: PERMANENT PACEMAKER INSERTION;  Surgeon: Evans Lance, MD;  Location: [facility] CATH LAB;  Service: Cardiovascular;  Laterality: N/A;    in for   Chief Complaint  Patient presents with  . Back Pain     HPI  Carol Bright  is a 81 y.o. female, with past medical history of  hypertension, hyperlipidemia, rheumatoid arthritis , has bradycardia and syncope required permanent pacemaker insertion, GERD, presents with complaints of lower back pain, patient with known history of chronic lower back pain, secondary to thoracic radiculopathy, patient reports she was at her usual state of health, yesterday during times given dinner, she was trying to reach to grab sugar from the shelf in the kitchen, where she suddenly felt a grabbing her back, and felt severe pain , denies any lifting heavy items, or sudden bend or twist, reports pain is significant, which prompted her to come to ED, patient received multiple doses of Dilaudid, and Percocet and Valium without significant relief of her pain, where she could not stand up and ambulate because of pain (no weakness, no urinary retention), patient thoracic x-ray, lumbar sacral x-ray, with no acute findings. Patient chest x-ray showing questionable right lung opacity, and reports cough, nonproductive over the last few days, he was started on Rocephin and azithromycin in ED for community-acquired pneumonia.  Review of Systems    In addition to the HPI above,  No Fever-chills, No Headache, No changes with Vision or hearing, No problems swallowing food or Liquids, No Chest pain, and planes of Cough , denies Shortness of Breath, No Abdominal pain, No Nausea or Vommitting,  Bowel movements are regular, No Blood in stool or Urine, No dysuria, No new skin rashes or bruises, Complaints of lower back pain No new weakness, tingling, numbness in any extremity, No recent weight gain or loss, No polyuria, polydypsia or polyphagia, No significant Mental Stressors.  A full 10 point Review of Systems was done, except as stated above, all other Review of Systems were negative.   Social History Social History  Substance Use Topics  . Smoking status: Current Every Day Smoker -- 0.50 packs/day for 50 years    Types: Cigarettes  . Smokeless  tobacco: Never Used     Comment: 02/06/2012 'stopped smoking 4-5 times"; offered smoking cessation materials; pt declines  . Alcohol Use: 0.0 oz/week    0 Standard drinks or equivalent per week     Comment: 02/06/2012 "occasional; "like a holiday"     Family History Family History  Problem Relation Age of Onset  . Cancer Mother     LEFT BREAST REMOVED  . Heart attack Mother   . Heart disease Mother   . Breast cancer Mother      Prior to Admission medications   Medication Sig Start Date End Date Taking? Authorizing Provider  acetaminophen (TYLENOL) 500 MG tablet Take 500 mg by mouth every 6 (six) hours as needed for moderate pain (pain).   Yes Historical Provider, MD  alendronate (FOSAMAX) 70 MG tablet Take 70 mg by mouth once a week. On Wednesday. Take with a full glass of water on an empty stomach.   Yes Historical Provider, MD  amLODipine (NORVASC) 2.5 MG tablet Take 1 tablet (2.5 mg total) by mouth 2 (two) times daily. 02/16/14  Yes Fay Records, MD  aspirin 81 MG tablet Take 81 mg by mouth at bedtime.   Yes Historical Provider, MD  Calcium Citrate (CITRACAL PO) Take 1 tablet by mouth daily.    Yes Historical Provider, MD  cetirizine (ZYRTEC) 10 MG tablet Take 10 mg by mouth daily as needed for allergies (allergies).    Yes Historical Provider, MD  famotidine (PEPCID) 10 MG tablet Take 10 mg by mouth 2 (two) times daily.   Yes Historical Provider, MD  folic acid (FOLVITE) 1 MG tablet Take 2 mg by mouth daily.   Yes Historical Provider, MD  gabapentin (NEURONTIN) 300 MG capsule Take 1 capsule by mouth 3 (three) times daily. 08/14/14  Yes Historical Provider, MD  meclizine (ANTIVERT) 12.5 MG tablet Take 1 tablet (12.5 mg total) by mouth 2 (two) times daily as needed for dizziness. 01/26/14  Yes Isaiah Serge, NP  Menthol-Methyl Salicylate (MUSCLE RUB) 10-15 % CREA Apply 1 application topically daily as needed for muscle pain (muscle pain).   Yes Historical Provider, MD  methotrexate  (RHEUMATREX) 2.5 MG tablet Take 25 mg by mouth once a week. Takes 10 tablets on Sunday. Caution:Chemotherapy. Protect from light.   Yes Historical Provider, MD  polyethylene glycol (MIRALAX / GLYCOLAX) packet Take 17 g by mouth every other day.   Yes Historical Provider, MD  predniSONE (DELTASONE) 1 MG tablet Take 3 mg by mouth daily with breakfast.   Yes Historical Provider, MD  rosuvastatin (CRESTOR) 5 MG tablet Take 5 mg by mouth 2 (two) times a week.  07/21/11  Yes Fay Records, MD  timolol (TIMOPTIC) 0.5 % ophthalmic solution Place 1 drop into both eyes 2 (two) times daily.  12/15/13  Yes Historical Provider, MD  magnesium hydroxide (MILK OF MAGNESIA) 400 MG/5ML suspension Take 30 mLs by  mouth daily as needed for mild constipation (constipation).    Historical Provider, MD    Allergies  Allergen Reactions  . Morphine And Related     Thought she had a stroke after taking this medication. Also couldn't talk.  Marland Kitchen Penicillins Hives    Physical Exam  Vitals  Blood pressure 158/70, pulse 61, temperature 98.5 F (36.9 C), temperature source Oral, resp. rate 16, SpO2 96 %.   1. General frail elderly female lying in bed in NAD,   2. Normal affect and insight, Not Suicidal or Homicidal, Awake Alert, Oriented X 3.  3. No F.N deficits, ALL C.Nerves Intact, Strength 5/5 all 4 extremities, Sensation intact all 4 extremities, Plantars down going.  4. Ears and Eyes appear Normal, Conjunctivae clear, PERRLA. Moist Oral Mucosa.  5. Supple Neck, No JVD, No cervical lymphadenopathy appriciated, No Carotid Bruits.  6. Symmetrical Chest wall movement, Good air movement bilaterally, CTAB.  7. RRR, No Gallops, Rubs or Murmurs, No Parasternal Heave.  8. Positive Bowel Sounds, Abdomen Soft, No tenderness, No organomegaly appriciated,No rebound -guarding or rigidity.  9.  No Cyanosis, Normal Skin Turgor, No Skin Rash or Bruise.  10. Good muscle tone,  joints appear normal , no effusions, Normal ROM.  Has significant tenderness on palpation on left thoracic paravertebral area.  11. No Palpable Lymph Nodes in Neck or Axillae   Data Review  CBC  Recent Labs Lab 12/18/14 1455  WBC 6.2  HGB 11.3*  HCT 35.9*  PLT 342  MCV 94.7  MCH 29.8  MCHC 31.5  RDW 14.5  LYMPHSABS 2.7  MONOABS 0.3  EOSABS 0.2  BASOSABS 0.1   ------------------------------------------------------------------------------------------------------------------  Chemistries   Recent Labs Lab 12/18/14 1455  NA 142  K 3.7  CL 112*  CO2 21*  GLUCOSE 91  BUN 7  CREATININE 0.80  CALCIUM 8.5*   ------------------------------------------------------------------------------------------------------------------ CrCl cannot be calculated (Unknown ideal weight.). ------------------------------------------------------------------------------------------------------------------ No results for input(s): TSH, T4TOTAL, T3FREE, THYROIDAB in the last 72 hours.  Invalid input(s): FREET3   Coagulation profile No results for input(s): INR, PROTIME in the last 168 hours. ------------------------------------------------------------------------------------------------------------------- No results for input(s): DDIMER in the last 72 hours. -------------------------------------------------------------------------------------------------------------------  Cardiac Enzymes No results for input(s): CKMB, TROPONINI, MYOGLOBIN in the last 168 hours.  Invalid input(s): CK ------------------------------------------------------------------------------------------------------------------ Invalid input(s): POCBNP   ---------------------------------------------------------------------------------------------------------------  Urinalysis    Component Value Date/Time   COLORURINE YELLOW 12/18/2014 [place] 12/18/2014 1241   LABSPEC 1.012 12/18/2014 1241   PHURINE 7.0 12/18/2014 1241   GLUCOSEU NEGATIVE  12/18/2014 1241   HGBUR NEGATIVE 12/18/2014 1241   BILIRUBINUR NEGATIVE 12/18/2014 1241   KETONESUR 15* 12/18/2014 1241   PROTEINUR NEGATIVE 12/18/2014 1241   UROBILINOGEN 0.2 06/28/2014 1335   NITRITE NEGATIVE 12/18/2014 1241   LEUKOCYTESUR TRACE* 12/18/2014 1241    ----------------------------------------------------------------------------------------------------------------  Imaging results:   Dg Chest 2 View  12/18/2014  CLINICAL DATA:  Left lower flank pain. EXAM: CHEST  2 VIEW COMPARISON:  03/15/2013 FINDINGS: Right-sided pacemaker unchanged. Lungs are adequately inflated with mild opacification over the right midlung and left retrocardiac region as cannot exclude atelectasis versus infection. No evidence of effusion. Stable cardiomegaly. Degenerative change of the spine and shoulders. IMPRESSION: Mild opacification over the right midlung and left retrocardiac region which may be due to atelectasis versus infection. Stable cardiomegaly. Electronically Signed   By: Marin Olp M.D.   On: 12/18/2014 15:31   Dg Thoracic Spine 2 View  12/18/2014  CLINICAL DATA:  Mid to lower  back pain, slightly worse on the left side. EXAM: THORACIC SPINE 2 VIEWS ; LUMBAR SPINE - COMPLETE 4+ VIEW COMPARISON:  CT of the thoracic spine dated 07/14/2014 FINDINGS: There is no evidence of thoracic whole lumbosacral spine fracture. There are 5 non-rib-bearing vertebral bodies with transitional anatomy of L5 vertebral body. There is exaggerated thoracic kyphosis, likely related to moderate to severe osteoarthritic changes of the lower thoracic spine. Multilevel osteoarthritic changes are also seen in the lumbosacral spine. No other significant bone abnormalities are identified. Vascular calcifications are noted. Partially visualized cardiac pacemaker leads are seen. IMPRESSION: No evidence of fracture or subluxation of the thoracic or lumbosacral spine. Osteoarthritic changes of the lower thoracic and lumbosacral  spine. Electronically Signed   By: Fidela Salisbury M.D.   On: 12/18/2014 13:39   Dg Lumbar Spine Complete  12/18/2014  CLINICAL DATA:  Mid to lower back pain, slightly worse on the left side. EXAM: THORACIC SPINE 2 VIEWS ; LUMBAR SPINE - COMPLETE 4+ VIEW COMPARISON:  CT of the thoracic spine dated 07/14/2014 FINDINGS: There is no evidence of thoracic whole lumbosacral spine fracture. There are 5 non-rib-bearing vertebral bodies with transitional anatomy of L5 vertebral body. There is exaggerated thoracic kyphosis, likely related to moderate to severe osteoarthritic changes of the lower thoracic spine. Multilevel osteoarthritic changes are also seen in the lumbosacral spine. No other significant bone abnormalities are identified. Vascular calcifications are noted. Partially visualized cardiac pacemaker leads are seen. IMPRESSION: No evidence of fracture or subluxation of the thoracic or lumbosacral spine. Osteoarthritic changes of the lower thoracic and lumbosacral spine. Electronically Signed   By: Fidela Salisbury M.D.   On: 12/18/2014 13:39        Assessment & Plan  Active Problems:   Dyslipidemia   HTN (hypertension)   Back pain  Intractable lower back pain - Patient with known history of chronic lower back pain, with known thoracic radiculopathy, with exacerbation of pain, most likely due to muscle spasm, pain was not relieved with multiple doses of IV pain meds in ED, the patient will be admitted for pain control, will start on Robaxin, when necessary oxycodone, and when necessary IV Dilaudid(given morphine allergy), will consult PT/OT service.  CAP - Patient chest x-ray significant for right lung opacity, she reports coughing for a few days, received IV Rocephin and azithromycin in ED, will start her on oral levofloxacin for community-acquired pneumonia.  Rheumatoid arthritis - Patient is on chronic dose prednisone 30 mg oral daily, we will continue, will hold  methotrexate.  Hypertension - Continue with amlodipine  Dyslipidemia - Continue with statin  History of sinus bradycardia in the past - Status post permanent pacemaker  DVT Prophylaxis Heparin  AM Labs Ordered, also please review Full Orders  Family Communication: Admission, patients condition and plan of care including tests being ordered have been discussed with the patient and friend at bedside who indicate understanding and agree with the plan and Code Status.  Code Status Full  Condition GUARDED    Time spent in minutes : 50 minutes    Abdelrahman Nair M.D on 12/18/2014 at 6:24 PM  Between 7am to 7pm - Pager - [phone]  After 7pm go to [email or web address] - password [email or web address]  And look for the night coverage person covering me after hours  [facility] Office  [phone]

## 2014-12-18 NOTE — ED Notes (Signed)
Pt. Stated, My back is hurting so badf I can't hardly stand up. This started a week ago.  Daughter stated, she wears a back brace.

## 2014-12-18 NOTE — ED Notes (Signed)
Attempted report 

## 2014-12-18 NOTE — ED Notes (Signed)
Patient transported to X-ray 

## 2014-12-18 NOTE — ED Notes (Signed)
Patient returned from X-ray 

## 2014-12-19 DIAGNOSIS — I10 Essential (primary) hypertension: Secondary | ICD-10-CM | POA: Diagnosis not present

## 2014-12-19 DIAGNOSIS — M546 Pain in thoracic spine: Secondary | ICD-10-CM | POA: Diagnosis not present

## 2014-12-19 DIAGNOSIS — E785 Hyperlipidemia, unspecified: Secondary | ICD-10-CM | POA: Diagnosis not present

## 2014-12-19 MED ORDER — PREDNISONE 20 MG PO TABS
40.0000 mg | ORAL_TABLET | Freq: Once | ORAL | Status: AC
  Administered 2014-12-19: 40 mg via ORAL
  Filled 2014-12-19: qty 2

## 2014-12-19 MED ORDER — METHOCARBAMOL 500 MG PO TABS
500.0000 mg | ORAL_TABLET | Freq: Three times a day (TID) | ORAL | Status: DC
  Administered 2014-12-19 – 2014-12-20 (×4): 500 mg via ORAL
  Filled 2014-12-19 (×4): qty 1

## 2014-12-19 MED ORDER — PREDNISONE 20 MG PO TABS: 40.0000 mg | ORAL_TABLET | Freq: Once | ORAL | Status: DC

## 2014-12-19 MED ORDER — PREDNISONE 20 MG PO TABS: 40.0000 mg | ORAL_TABLET | Freq: Every day | ORAL | Status: DC

## 2014-12-19 MED ORDER — METHOCARBAMOL 750 MG PO TABS
750.0000 mg | ORAL_TABLET | Freq: Once | ORAL | Status: AC
  Administered 2014-12-19: 750 mg via ORAL
  Filled 2014-12-19: qty 1

## 2014-12-19 NOTE — Progress Notes (Signed)
Patient Demographics  Carol Bright, is a 81 y.o. female, DOB - [birth year], [id number]  Admit date - 12/18/2014   Admitting Physician Albertine Patricia, MD  Outpatient Primary MD for the patient is Donnajean Lopes, MD  LOS -    Chief Complaint  Patient presents with  . Back Pain         Subjective:   [name] today has, No headache, No chest pain, No abdominal pain - No Nausea, No new weakness tingling or numbness, No Cough - SOB.  Still complains of back pain  Assessment & Plan    Active Problems:   Dyslipidemia   HTN (hypertension)   Back pain  Intractable lower back pain - Patient with known history of chronic lower back pain, with known thoracic radiculopathy, with exacerbation of pain, most likely due to muscle spasm, . -  Patient still complaining of significant pain,  Will give one dose prednisone 40 mg oral today, will continue with Robaxin on a scheduled basis,   continue with when necessary pain medicine,  PT/OT evaluation appreciated.  CAP -  Continue with levofloxacin.  Rheumatoid arthritis - Patient is on chronic dose prednisone 3 mg oral daily, we will continue, will hold methotrexate.  Hypertension - Continue with amlodipine  Dyslipidemia - Continue with statin  History of sinus bradycardia in the past - Status post permanent pacemaker  Code Status:Full  Family Communication:  Family at bedside  Disposition Plan:  Home when stable   Procedures   none   Consults    none   Medications  Scheduled Meds: . amLODipine  2.5 mg Oral BID  . aspirin EC  81 mg Oral QHS  . famotidine  10 mg Oral BID  . feeding supplement (ENSURE ENLIVE)  237 mL Oral BID BM  . folic acid  2 mg Oral Daily  . heparin  5,000 Units Subcutaneous 3 times per day  . levofloxacin  500 mg Oral Daily  . methocarbamol  500 mg Oral TID  . polyethylene glycol  17 g Oral QODAY    . predniSONE  3 mg Oral Q breakfast  . predniSONE  40 mg Oral Once  . [START ON 12/21/2014] rosuvastatin  5 mg Oral Once per day on Mon Thu  . timolol  1 drop Both Eyes BID   Continuous Infusions:  PRN Meds:.acetaminophen **OR** acetaminophen, HYDROmorphone (DILAUDID) injection, meclizine, oxyCODONE  DVT Prophylaxis   Heparin   Lab Results  Component Value Date   PLT 342 12/18/2014    Antibiotics    Anti-infectives    Start     Dose/Rate Route Frequency Ordered Stop   12/19/14 1800  levofloxacin (LEVAQUIN) tablet 500 mg     500 mg Oral Daily 12/18/14 1820     12/18/14 1600  cefTRIAXone (ROCEPHIN) 1 g in dextrose 5 % 50 mL IVPB     1 g 100 mL/hr over 30 Minutes Intravenous  Once 12/18/14 1546 12/18/14 1705   12/18/14 1600  azithromycin (ZITHROMAX) 500 mg in dextrose 5 % 250 mL IVPB     500 mg 250 mL/hr over 60 Minutes Intravenous  Once 12/18/14 1546 12/18/14 1807          Objective:   Filed Vitals:  12/18/14 1930 12/18/14 2018 12/19/14 0512 12/19/14 1423  BP: 155/76 154/75 128/56 136/73  Pulse: 67 60 60 58  Temp:  97.7 F (36.5 C) 99.2 F (37.3 C) 98.8 F (37.1 C)  TempSrc:  Oral Oral Oral  Resp:  18 18 18   Height:  4\' 11"  (1.499 m)    Weight:  60.8 kg (134 lb 0.6 oz)    SpO2: 96% 98% 100% 97%    Wt Readings from Last 3 Encounters:  12/18/14 60.8 kg (134 lb 0.6 oz)  10/20/14 63.504 kg (140 lb)  07/23/14 62.71 kg (138 lb 4 oz)     Intake/Output Summary (Last 24 hours) at 12/19/14 1541 Last data filed at 12/19/14 1410  Gross per 24 hour  Intake    300 ml  Output   1525 ml  Net  -1225 ml     Physical Exam  Awake Alert, Oriented X 3, No new F.N deficits, Normal affect [place].AT,PERRAL Supple Neck,No JVD, No cervical lymphadenopathy appriciated.  Symmetrical Chest wall movement, Good air movement bilaterally, CTAB RRR,No Gallops,Rubs or new Murmurs, No Parasternal Heave +ve B.Sounds, Abd Soft, No tenderness, No organomegaly appriciated, No rebound -  guarding or rigidity. No Cyanosis, Clubbing or edema, No new Rash or bruise , left thoracic paravertebral tenderness,  Pain improved since yesterday, but still significant she cannot stand up.   Data Review   Micro Results No results found for this or any previous visit (from the past 240 hour(s)).  Radiology Reports Dg Chest 2 View  12/18/2014  CLINICAL DATA:  Left lower flank pain. EXAM: CHEST  2 VIEW COMPARISON:  03/15/2013 FINDINGS: Right-sided pacemaker unchanged. Lungs are adequately inflated with mild opacification over the right midlung and left retrocardiac region as cannot exclude atelectasis versus infection. No evidence of effusion. Stable cardiomegaly. Degenerative change of the spine and shoulders. IMPRESSION: Mild opacification over the right midlung and left retrocardiac region which may be due to atelectasis versus infection. Stable cardiomegaly. Electronically Signed   By: Marin Olp M.D.   On: 12/18/2014 15:31   Dg Thoracic Spine 2 View  12/18/2014  CLINICAL DATA:  Mid to lower back pain, slightly worse on the left side. EXAM: THORACIC SPINE 2 VIEWS ; LUMBAR SPINE - COMPLETE 4+ VIEW COMPARISON:  CT of the thoracic spine dated 07/14/2014 FINDINGS: There is no evidence of thoracic whole lumbosacral spine fracture. There are 5 non-rib-bearing vertebral bodies with transitional anatomy of L5 vertebral body. There is exaggerated thoracic kyphosis, likely related to moderate to severe osteoarthritic changes of the lower thoracic spine. Multilevel osteoarthritic changes are also seen in the lumbosacral spine. No other significant bone abnormalities are identified. Vascular calcifications are noted. Partially visualized cardiac pacemaker leads are seen. IMPRESSION: No evidence of fracture or subluxation of the thoracic or lumbosacral spine. Osteoarthritic changes of the lower thoracic and lumbosacral spine. Electronically Signed   By: Fidela Salisbury M.D.   On: 12/18/2014 13:39    Dg Lumbar Spine Complete  12/18/2014  CLINICAL DATA:  Mid to lower back pain, slightly worse on the left side. EXAM: THORACIC SPINE 2 VIEWS ; LUMBAR SPINE - COMPLETE 4+ VIEW COMPARISON:  CT of the thoracic spine dated 07/14/2014 FINDINGS: There is no evidence of thoracic whole lumbosacral spine fracture. There are 5 non-rib-bearing vertebral bodies with transitional anatomy of L5 vertebral body. There is exaggerated thoracic kyphosis, likely related to moderate to severe osteoarthritic changes of the lower thoracic spine. Multilevel osteoarthritic changes are also seen in the lumbosacral spine.  No other significant bone abnormalities are identified. Vascular calcifications are noted. Partially visualized cardiac pacemaker leads are seen. IMPRESSION: No evidence of fracture or subluxation of the thoracic or lumbosacral spine. Osteoarthritic changes of the lower thoracic and lumbosacral spine. Electronically Signed   By: Fidela Salisbury M.D.   On: 12/18/2014 13:39     CBC  Recent Labs Lab 12/18/14 1455  WBC 6.2  HGB 11.3*  HCT 35.9*  PLT 342  MCV 94.7  MCH 29.8  MCHC 31.5  RDW 14.5  LYMPHSABS 2.7  MONOABS 0.3  EOSABS 0.2  BASOSABS 0.1    Chemistries   Recent Labs Lab 12/18/14 1455  NA 142  K 3.7  CL 112*  CO2 21*  GLUCOSE 91  BUN 7  CREATININE 0.80  CALCIUM 8.5*   ------------------------------------------------------------------------------------------------------------------ estimated creatinine clearance is 43 mL/min (by C-G formula based on Cr of 0.8). ------------------------------------------------------------------------------------------------------------------ No results for input(s): HGBA1C in the last 72 hours. ------------------------------------------------------------------------------------------------------------------ No results for input(s): CHOL, HDL, LDLCALC, TRIG, CHOLHDL, LDLDIRECT in the last 72  hours. ------------------------------------------------------------------------------------------------------------------ No results for input(s): TSH, T4TOTAL, T3FREE, THYROIDAB in the last 72 hours.  Invalid input(s): FREET3 ------------------------------------------------------------------------------------------------------------------ No results for input(s): VITAMINB12, FOLATE, FERRITIN, TIBC, IRON, RETICCTPCT in the last 72 hours.  Coagulation profile No results for input(s): INR, PROTIME in the last 168 hours.  No results for input(s): DDIMER in the last 72 hours.  Cardiac Enzymes No results for input(s): CKMB, TROPONINI, MYOGLOBIN in the last 168 hours.  Invalid input(s): CK ------------------------------------------------------------------------------------------------------------------ Invalid input(s): POCBNP     Time Spent in minutes   25 minutes   Margo Lama M.D on 12/19/2014 at 3:41 PM  Between 7am to 7pm - Pager - [phone]  After 7pm go to [email or web address] - password [email or web address]  [facility]   Office  [phone]

## 2014-12-19 NOTE — Evaluation (Signed)
Occupational Therapy Evaluation Patient Details Name: Carol Bright MRN: [id number] DOB: 18-Jun-1930 Today's Date: 12/19/2014    History of Present Illness Carol Bright is a 81 y.o. 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, GERD, presents with complaints of lower back pain, patient with known history of chronic lower back pain, secondary to thoracic radiculopathy, patient reports she was at her usual state of health, yesterday during times given dinner, she was trying to reach to grab sugar from the shelf in the kitchen, where she suddenly felt a grabbing her back, and felt severe pain , denies any lifting heavy items, or sudden bend or twist, reports pain is significant, which prompted her to come to ED, patient received multiple doses of Dilaudid, and Percocet and Valium without significant relief of her pain, where she could not stand up and ambulate because of pain (no weakness, no urinary retention), patient thoracic x-ray, lumbar sacral x-ray, with no acute findings.   Clinical Impression   Patient presenting with decreased ADL & functional mobility independence and increased pain. Patient independent to mod I PTA. Patient currently functioning at an overall min to max assist level. Patient will benefit from acute OT to increase overall independence in the areas of ADLs, functional mobility, and overall safety in order to safely discharge home with 24/7 supervision/assistance. At this time, due to patient's increased pain which limits her mobility & safety, I am recommending 24/7 supervision/assistance post acute d/c. Family unsure that they can provide this. IF they are unable, SNF may be patient's best discharge option.    Follow Up Recommendations  Home health OT;Supervision/Assistance - 24 hour    Equipment Recommendations  None recommended by OT    Recommendations for  Other Services  None at this tim   Precautions / Restrictions Precautions Precautions: Fall Restrictions Weight Bearing Restrictions: No   Mobility Bed Mobility Overal bed mobility: Needs Assistance Bed Mobility: Rolling;Supine to Sit Rolling: Min assist   Supine to sit: Mod assist     General bed mobility comments: Cues for technique to adhere to back precausions. Cues also for safety and technique.   Transfers Overall transfer level: Needs assistance Equipment used: None;1 person hand held assist Transfers: Sit to/from [facility] Sit to Stand: Min assist Stand pivot transfers: Min assist       General transfer comment: Pt unsafely transferred EOB to recliner, cues for technique and safety needed. Also encouraged patient to adhere to back precautions for pain management.     Balance Overall balance assessment: Needs assistance Sitting-balance support: No upper extremity supported;Feet supported Sitting balance-Leahy Scale: Fair     Standing balance support: No upper extremity supported;During functional activity Standing balance-Leahy Scale: Poor    ADL Overall ADL's : Needs assistance/impaired Eating/Feeding: Set up;Sitting   Grooming: Set up;Sitting   Upper Body Bathing: Min guard;Sitting   Lower Body Bathing: Maximal assistance;Sit to/from stand   Upper Body Dressing : Min guard;Sitting   Lower Body Dressing: Maximal assistance;Sit to/from stand   Toilet Transfer: Minimal assistance;BSC;Stand-pivot General ADL Comments: Pt transferred EOB to recliner without use of AD, pt unsafely holding onto recliner armrests and bending & twisting at back. Educated patient on back precautions to prevent pain and handout given. Discussed patient may need RW for safety with transfers and mobility at this time. Patient's son, granddaughter, and one of granddaughter's friends was present in room entire session.     Pertinent Vitals/Pain  Pain Assessment:  0-10 Pain Score: 5  Pain Location: back Pain Descriptors / Indicators: Aching;Discomfort Pain Intervention(s): Limited activity within patient's tolerance;Monitored during session;Repositioned     Hand Dominance Right   Extremity/Trunk Assessment Upper Extremity Assessment Upper Extremity Assessment: Overall WFL for tasks assessed   Lower Extremity Assessment Lower Extremity Assessment: Defer to PT evaluation   Cervical / Trunk Assessment Cervical / Trunk Assessment: Other exceptions Cervical / Trunk Exceptions: Pt hunched over due to pain in back, pain vs posture?    Communication Communication Communication: No difficulties   Cognition Arousal/Alertness: Awake/alert Behavior During Therapy: WFL for tasks assessed/performed Overall Cognitive Status: Within Functional Limits for tasks assessed              Home Living Family/patient expects to be discharged to:: Private residence Living Arrangements: Alone Available Help at Discharge: Family;Available PRN/intermittently (has family that checks on her daily) Type of Home: House Home Access: Stairs to enter [employer] of Steps: 3 - side door Entrance Stairs-Rails: Left Home Layout: One level     Bathroom Shower/Tub: Tub/shower unit;Curtain (pt reports she enjoys baths, but is too scared she won't be able to get up. Pt therefore sits on the side of the tub.)   Bathroom Toilet: Standard     Home Equipment: Cane - single point;Bedside commode   Prior Functioning/Environment Level of Independence: Independent  Comments: Pt reports she uses the cane sometimes and especially when she goes out of the house    OT Diagnosis: Generalized weakness;Acute pain   OT Problem List: Decreased strength;Decreased range of motion;Decreased activity tolerance;Impaired balance (sitting and/or standing);Decreased safety awareness;Decreased knowledge of use of DME or AE;Decreased knowledge of precautions;Pain   OT  Treatment/Interventions: Self-care/ADL training;Therapeutic exercise;Energy conservation;DME and/or AE instruction;Therapeutic activities;Patient/family education;Balance training    OT Goals(Current goals can be found in the care plan section) Acute Rehab OT Goals Patient Stated Goal: decrease pain OT Goal Formulation: With patient/family Time For Goal Achievement: 01/02/15 Potential to Achieve Goals: Good ADL Goals Pt Will Perform Grooming: standing;with supervision Pt Will Perform Lower Body Bathing: with supervision;sit to/from stand;with adaptive equipment Pt Will Perform Lower Body Dressing: with supervision;with adaptive equipment;sit to/from stand Pt Will Transfer to Toilet: with supervision;ambulating;bedside commode Pt Will Perform Toileting - Clothing Manipulation and hygiene: with supervision;sit to/from stand Pt Will Perform Tub/Shower Transfer: Tub transfer;rolling walker;ambulating;with modified independence Additional ADL Goal #1: Pt will be supervision with functional ambulation using LRAD  OT Frequency: Min 2X/week   Barriers to D/C: Decreased caregiver support   End of Session Equipment Utilized During Treatment: Gait belt  Activity Tolerance: Patient tolerated treatment well Patient left: in chair;with call bell/phone within reach;with chair alarm set;with family/visitor present   Time: 1210-1230 OT Time Calculation (min): 20 min Charges:  OT General Charges $OT Visit: 1 Procedure OT Evaluation $Initial OT Evaluation Tier I: 1 Procedure G-Codes: OT G-codes **NOT FOR INPATIENT CLASS** Functional Limitation: Self care Self Care Current Status [id number]): At least 40 percent but less than 60 percent impaired, limited or restricted Self Care Goal Status [id number]): At least 1 percent but less than 20 percent impaired, limited or restricted  Najeeb Uptain , MS, OTR/L, CLT Pager: [phone]  12/19/2014, 1:18 PM

## 2014-12-19 NOTE — Progress Notes (Signed)
Initial Nutrition Assessment  DOCUMENTATION CODES:   Not applicable  INTERVENTION:  Ensure Enlive po BID, each supplement provides 350 kcal and 20 grams of protein   Encourage meal and supplement intake   NUTRITION DIAGNOSIS:   Inadequate oral intake related to poor appetite as evidenced by per patient/family report.   GOAL:   Patient will meet greater than or equal to 90% of their needs   MONITOR:   PO intake, Supplement acceptance, Weight trends  REASON FOR ASSESSMENT:   Malnutrition Screening Tool    ASSESSMENT:  81 y.o. female, with past medical history of hypertension, hyperlipidemia, rheumatoid arthritis , GERD, presents with complaints of lower back pain, she was reaching for an item in the cabinet and had sudden severe pain in her back..   Pt lives at home is able to prepare her own food. She eats regular meals 3 times daily but says her appetite has not been as good lately. Her weight 4% wt loss over the past month which is not significant. She is receiving Ensure enlive and says this is the first time. Encouraged her to continue supplemental nutrition between meals 1-2 times daily after discharge to prevent further weight loss. She has mild muscle and fat mass decrease may be age related vs lack of adequate nutrient intake.   Diet Order:  Diet Heart Room service appropriate?: Yes; Fluid consistency:: Thin  Skin:   intact  Last BM:   11/26  Height:   Ht Readings from Last 1 Encounters:  12/18/14 4\' 11"  (1.499 m)    Weight:   Wt Readings from Last 1 Encounters:  12/18/14 134 lb 0.6 oz (60.8 kg)    Ideal Body Weight:  43.18 kg  BMI:  Body mass index is 27.06 kg/(m^2).  Estimated Nutritional Needs:   Kcal:   1200-1400  Protein:   72 gr   Fluid:   >1500 daily  EDUCATION NEEDS:   Education needs addressed  Colman Cater MS,RD,CSG,LDN Office: [phone] Pager: [phone]

## 2014-12-19 NOTE — Evaluation (Signed)
Physical Therapy Evaluation Patient Details Name: Carol Bright MRN: [id number] DOB: 07-11-1930 Today's Date: 12/19/2014   History of Present Illness  Carol Bright is a 81 y.o. 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 Patient reports she was in her usual state of health. During Thanksgiving dinner, she reached to grab sugar from the shelf in the kitchen.  She suddenly felt a grabbing her back, and felt severe pain.  Denies any lifting heavy items, or sudden bend or twist.  She could not stand up and ambulate because of pain. (no weakness, no urinary retention)   Patient's thoracic x-ray, lumbar sacral x-ray, with no acute findings.    Clinical Impression  Patient presents with problems listed below.  Will benefit from acute PT to maximize functional independence prior to discharge home.  Patient currently requiring mod assist for mobility, and unable to ambulate.  To discharge home safely, patient will need to function at Mod I level.  If unable to reach this level, patient will need 24 hour assist, or may need to consider ST-SNF.    Follow Up Recommendations Home health PT;Supervision/Assistance - 24 hour (if not at Mod I level at d/c)    Equipment Recommendations  Rolling walker with 5" wheels    Recommendations for Other Services       Precautions / Restrictions Precautions Precautions: Fall Restrictions Weight Bearing Restrictions: No      Mobility  Bed Mobility Overal bed mobility: Needs Assistance Bed Mobility: Rolling;Sidelying to Sit;Sit to Sidelying Rolling: Min guard (Increased time) Sidelying to sit: Mod assist Supine to sit: Mod assist   Sit to sidelying: Mod assist General bed mobility comments: Verbal cues for  technique.  Assist to raise trunk to sitting position.  Required assist to bring LE's onto bed and lower trunk to return to sidelying position.  Requires increased time for movement due to increased pain with movement.   Transfers Overall transfer level: Needs assistance Equipment used: Rolling walker (2 wheeled) Transfers: Sit to/from Stand Sit to Stand: Min assist Stand pivot transfers: Min assist       General transfer comment: Verbal cues for hand placement.  Assist for balance as patient powered up to partial standing position.  Patient with wide BOS, and trunk flexed forward, leaning on RW on forearms due to pain.  Attempted to have patient move to upright stance, and patient's knees buckled.  Ambulation/Gait Ambulation/Gait assistance: Min assist Ambulation Distance (Feet): 2 Feet Assistive device: Rolling walker (2 wheeled) Gait Pattern/deviations: Step-through pattern;Decreased step length - right;Decreased step length - left;Shuffle;Trunk flexed;Wide base of support     General Gait Details: Patient attempted ambulation with RW.  Was able to take several shuffle steps and had to stop due to pain.  Returned to bed to sidelying position.  Stairs            Wheelchair Mobility    Modified Rankin (Stroke Patients Only)       Balance Overall balance assessment: Needs assistance Sitting-balance support: No upper extremity supported;Feet supported Sitting balance-Leahy Scale: Fair   Postural control: Other (comment) (Flexed posture) Standing balance support: Bilateral upper extremity supported Standing balance-Leahy Scale: Poor Standing balance comment: Unable to reach fully upright standing position.  Pertinent Vitals/Pain Pain Assessment: 0-10 Pain Score: 5  Pain Location: back and anterior rib cage on left Pain Descriptors / Indicators: Spasm;Aching;Sharp Pain Intervention(s): Limited activity within patient's  tolerance;Patient requesting pain meds-RN notified;RN gave pain meds during session    Home Living Family/patient expects to be discharged to:: Private residence Living Arrangements: Alone Available Help at Discharge: Family;Available PRN/intermittently (Daughter lives nearby and works during day) Type of Home: House Home Access: Stairs to enter Entrance Stairs-Rails: Left Entrance Stairs-Number of Steps: 3 - side door Home Layout: One level Home Equipment: [place] - single point;Bedside commode      Prior Function Level of Independence: Independent with assistive device(s)         Comments: Patient reports she uses cane at times.     Hand Dominance   Dominant Hand: Right    Extremity/Trunk Assessment   Upper Extremity Assessment: Defer to OT evaluation           Lower Extremity Assessment: Generalized weakness (Pain impacting strength)      Cervical / Trunk Assessment: Kyphotic  Communication   Communication: No difficulties  Cognition Arousal/Alertness: Awake/alert Behavior During Therapy: WFL for tasks assessed/performed Overall Cognitive Status: Within Functional Limits for tasks assessed                      General Comments      Exercises        Assessment/Plan    PT Assessment Patient needs continued PT services  PT Diagnosis Difficulty walking;Acute pain;Generalized weakness   PT Problem List Decreased strength;Decreased activity tolerance;Decreased balance;Decreased mobility;Decreased knowledge of use of DME;Decreased knowledge of precautions;Pain  PT Treatment Interventions DME instruction;Gait training;Functional mobility training;Therapeutic activities;Patient/family education   PT Goals (Current goals can be found in the Care Plan section) Acute Rehab PT Goals Patient Stated Goal: decrease pain PT Goal Formulation: With patient/family Time For Goal Achievement: 12/26/14 Potential to Achieve Goals: Good    Frequency Min 3X/week    Barriers to discharge Decreased caregiver support Patient lives alone. Family available prn.    Co-evaluation               End of Session Equipment Utilized During Treatment: Gait belt Activity Tolerance: Patient limited by pain Patient left: in bed;with call bell/phone within reach;with bed alarm set;with family/visitor present Nurse Communication: Mobility status;Patient requests pain meds (Unable to ambulate)    Functional Assessment Tool Used: Clinical judgement Functional Limitation: Mobility: Walking and moving around Mobility: Walking and Moving Around Current Status [phone]): At least 40 percent but less than 60 percent impaired, limited or restricted Mobility: Walking and Moving Around Goal Status [phone]): At least 1 percent but less than 20 percent impaired, limited or restricted    Time: 1416-1439 PT Time Calculation (min) (ACUTE ONLY): 23 min   Charges:   PT Evaluation $Initial PT Evaluation Tier I: 1 Procedure PT Treatments $Therapeutic Activity: 8-22 mins   PT G Codes:   PT G-Codes **NOT FOR INPATIENT CLASS** Functional Assessment Tool Used: Clinical judgement Functional Limitation: Mobility: Walking and moving around Mobility: Walking and Moving Around Current Status [id number]): At least 40 percent but less than 60 percent impaired, limited or restricted Mobility: Walking and Moving Around Goal Status [phone]): At least 1 percent but less than 20 percent impaired, limited or restricted    Despina Pole 12/19/2014, 3:20 PM Carita Pian. Sanjuana Kava, [place] Pager [phone]

## 2014-12-20 DIAGNOSIS — I10 Essential (primary) hypertension: Secondary | ICD-10-CM | POA: Diagnosis not present

## 2014-12-20 DIAGNOSIS — M546 Pain in thoracic spine: Secondary | ICD-10-CM | POA: Diagnosis not present

## 2014-12-20 DIAGNOSIS — E785 Hyperlipidemia, unspecified: Secondary | ICD-10-CM | POA: Diagnosis not present

## 2014-12-20 MED ORDER — LEVOFLOXACIN 500 MG PO TABS: 500.0000 mg | ORAL_TABLET | Freq: Every day | ORAL | Status: DC

## 2014-12-20 MED ORDER — METHOCARBAMOL 500 MG PO TABS: 500.0000 mg | ORAL_TABLET | Freq: Three times a day (TID) | ORAL | Status: DC | PRN

## 2014-12-20 MED ORDER — PREDNISONE 20 MG PO TABS
20.0000 mg | ORAL_TABLET | Freq: Once | ORAL | Status: AC
  Administered 2014-12-20: 20 mg via ORAL
  Filled 2014-12-20: qty 1

## 2014-12-20 MED ORDER — OXYCODONE HCL 5 MG PO TABS: 5.0000 mg | ORAL_TABLET | Freq: Three times a day (TID) | ORAL | Status: DC | PRN

## 2014-12-20 NOTE — Progress Notes (Signed)
Dr. [facility] [employer] requested ortho tech consult r/t patient's complaint that her back brace is too large. Spoke with ortho tech via phone, this brace is provided by a vendor - suggested family member bring in the brace today, vendor will be notified and could come in to adjust the brace. Patient is calling family member to see if someone could bring in before she discharges.

## 2014-12-20 NOTE — Progress Notes (Signed)
Physical Therapy Treatment Patient Details Name: Carol Bright MRN: [id number] DOB: Sep 01, 1930 Today's Date: 12/20/2014    History of Present Illness Carol Bright is a 81 y.o. 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 Patient reports she was in her usual state of health. During Thanksgiving dinner, she reached to grab sugar from the shelf in the kitchen.  She suddenly felt a grabbing her back, and felt severe pain.  Denies any lifting heavy items, or sudden bend or twist.  She could not stand up and ambulate because of pain. (no weakness, no urinary retention)   Patient's thoracic x-ray, lumbar sacral x-ray, with no acute findings.    PT Comments    Patient with reduced pain today, allowing for increased mobility.  Able to ambulate 79' with min guard assist.  Recommend f/u HHPT for continued therapy at discharge.  Follow Up Recommendations  Home health PT;Supervision - Intermittent     Equipment Recommendations  Rolling walker with 5" wheels    Recommendations for Other Services       Precautions / Restrictions Precautions Precautions: Fall Restrictions Weight Bearing Restrictions: No    Mobility  Bed Mobility Overal bed mobility: Needs Assistance Bed Mobility: Rolling;Sidelying to Sit Rolling: Supervision Sidelying to sit: Min assist       General bed mobility comments: Verbal cues for technique.  Encouraged patient to push up using her UE's rather than pulling on daughter.  Transfers Overall transfer level: Needs assistance Equipment used: Rolling walker (2 wheeled) Transfers: Sit to/from Stand Sit to Stand: Supervision         General transfer comment: Supervision for safety only.  Dependence on RW for  balance/safety in stance.  Ambulation/Gait Ambulation/Gait assistance: Min guard Ambulation Distance (Feet): 72 Feet Assistive device: Rolling walker (2 wheeled) Gait Pattern/deviations: Step-through pattern;Decreased stride length;Shuffle;Trunk flexed Gait velocity: decreased Gait velocity interpretation: Below normal speed for age/gender General Gait Details: Patient demonstrates safe use of RW today.  Less pain today allowing for ambulation.  Dependent on RW for balance/safety during gait.   Stairs            Wheelchair Mobility    Modified Rankin (Stroke Patients Only)       Balance                                    Cognition Arousal/Alertness: Awake/alert Behavior During Therapy: WFL for tasks assessed/performed Overall Cognitive Status: Within Functional Limits for tasks assessed                      Exercises      General Comments        Pertinent Vitals/Pain Pain Assessment: 0-10 Pain Score: 4  Pain Location: anterior rib cage on left Pain Descriptors / Indicators: Spasm Pain Intervention(s): Monitored during session;Repositioned    Home Living                      Prior Function            PT Goals (current goals can now be found in the care plan section) Progress towards PT goals: Progressing toward goals    Frequency  Min 3X/week    PT Plan  Current plan remains appropriate    Co-evaluation             End of Session Equipment Utilized During Treatment: Gait belt Activity Tolerance: Patient tolerated treatment well Patient left: in chair;with call bell/phone within reach;with family/visitor present;with chair alarm set     Time: [id number] PT Time Calculation (min) (ACUTE ONLY): 14 min  Charges:  $Gait Training: 8-22 mins                    G Codes:  Functional Assessment Tool Used: Clinical judgement Functional Limitation: Mobility: Walking and moving around Mobility: Walking and Moving  Around Goal Status [phone]): At least 1 percent but less than 20 percent impaired, limited or restricted Mobility: Walking and Moving Around Discharge Status [phone]): At least 1 percent but less than 20 percent impaired, limited or restricted   Despina Pole 12/20/2014, 2:46 PM Carita Pian. Sanjuana Kava, [place] Pager [phone]

## 2014-12-20 NOTE — Discharge Summary (Signed)
Carol Bright, is a 81 y.o. female  DOB [birth year]  MRN [id number].  Admission date:  12/18/2014  Admitting Physician  Albertine Patricia, MD  Discharge Date:  12/20/2014   Primary MD  Donnajean Lopes, MD  Recommendations for primary care physician for things to follow:  - Patient to continue with PT as an outpatient. - Please repeat 2 view chest x-ray in 2 weeks from discharge.   Admission Diagnosis  URI (upper respiratory infection) [J06.9] Intractable back pain [M54.9]   Discharge Diagnosis  URI (upper respiratory infection) [J06.9] Intractable back pain [M54.9]    Active Problems:   Dyslipidemia   HTN (hypertension)   Back pain      Past Medical History  Diagnosis Date  . Hypertension   . Rheumatoid arthritis(714.0)     PT IS FOLLOWED BY DR Page  . Hyperlipidemia   . Cataract     Bil  . Diverticulosis   . Orthostatic lightheadedness   . Palpitations   . Sinus bradycardia 02/04/2012  . Syncope and collapse 02/04/2012    "first time ever" (02/06/2012)  . History of bronchitis     "occasionally" (02/06/2012)  . Borderline diabetes   . GERD (gastroesophageal reflux disease)   . External hemorrhoids   . Guaiac + stool 2005    "went to Dr. Henrene Pastor; took out polyps" (02/06/2012)  . Daily headache     "recently" (02/06/2012)  . Breast cancer ([place]) [place]  . Dyspnea on exertion     "off and on for years; just worse recently" (02/06/2012)  . H/O hiatal hernia     Past Surgical History  Procedure Laterality Date  . Mastectomy  1995    Left  . Cystectomy  1993    Left shoulder  . Colonoscopy  11/2011  . Polypectomy  ~2005; ~2008  . Tonsillectomy  1950's  . Vaginal hysterectomy  1970  . Cardiac catheterization  02/05/2012    "first one ever" (02/06/2012)  . Left and right heart catheterization with coronary angiogram  N/A 02/05/2012    Procedure: LEFT AND RIGHT HEART CATHETERIZATION WITH CORONARY ANGIOGRAM;  Surgeon: Sherren Mocha, MD;  Location: [facility] CATH LAB;  Service: Cardiovascular;  Laterality: N/A;  . Permanent pacemaker insertion N/A 02/07/2012    Procedure: PERMANENT PACEMAKER INSERTION;  Surgeon: Evans Lance, MD;  Location: [facility] CATH LAB;  Service: Cardiovascular;  Laterality: N/A;       History of present illness and  Hospital Course:     Kindly see H&P for history of present illness and admission details, please review complete Labs, Consult reports and Test reports for all details in brief  HPI  from the history and physical done on the day of admission 11/25 Carol Bright is a 81 y.o. 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, GERD, presents with complaints of lower back pain, patient with known history of  chronic lower back pain, secondary to thoracic radiculopathy, patient reports she was at her usual state of health, yesterday during times given dinner, she was trying to reach to grab sugar from the shelf in the kitchen, where she suddenly felt a grabbing her back, and felt severe pain , denies any lifting heavy items, or sudden bend or twist, reports pain is significant, which prompted her to come to ED, patient received multiple doses of Dilaudid, and Percocet and Valium without significant relief of her pain, where she could not stand up and ambulate because of pain (no weakness, no urinary retention), patient thoracic x-ray, lumbar sacral x-ray, with no acute findings. Patient chest x-ray showing questionable right lung opacity, and reports cough, nonproductive over the last few days, he was started on Rocephin and azithromycin in ED for community-acquired pneumonia.   Hospital Course   Intractable lower back pain - Patient with known history of chronic lower back pain, with  known thoracic radiculopathy, with exacerbation of pain, most likely due to muscle spasm, pain was not relieved with multiple doses of IV pain meds in ED, the patient will be admitted for pain control, seen by PT, recommended home PT, and rolling walker, pain significantly improved with Robaxin scheduled dose, prednisone, we'll discharge on when necessary Robaxin and oxycodone, to continue with home PT.  CAP - Patient chest x-ray significant for right lung opacity, she reports coughing for a few days, received IV Rocephin and azithromycin in ED, bed on levofloxacin for treatment, to finish another 3 days as an outpatient.  Rheumatoid arthritis - Continue with home medication on discharge  Hypertension - Continue with amlodipine  Dyslipidemia - Continue with statin  History of sinus bradycardia in the past - Status post permanent pacemaker      Discharge Condition:  Stable   Follow UP  Follow-up Information    Follow up with Donnajean Lopes, MD. Schedule an appointment as soon as possible for a visit in 1 week.   Specialty:  Internal Medicine   Why:  Posthospitalization follow-up   Contact information:   [address] [place] [address] [phone]         Discharge Instructions  and  Discharge Medications     Discharge Instructions    Diet - low sodium heart healthy    Complete by:  As directed      Discharge instructions    Complete by:  As directed   Follow with Primary MD Donnajean Lopes, MD in 7 days   Get CBC, CMP,checked  by Primary MD next visit.    Activity: As tolerated with Full fall precautions use walker/cane & assistance as needed   Disposition Home    Diet: Heart Healthy  , with feeding assistance and aspiration precautions.  For Heart failure patients - Check your Weight same time everyday, if you gain over 2 pounds, or you develop in leg swelling, experience more shortness of breath or chest pain, call your Primary MD immediately.  Follow Cardiac Low Salt Diet and 1.5 lit/day fluid restriction.   On your next visit with your primary care physician please Get Medicines reviewed and adjusted.   Please request your Prim.MD to go over all Hospital Tests and Procedure/Radiological results at the follow up, please get all Hospital records sent to your Prim MD by signing hospital release before you go home.   If you experience worsening of your admission symptoms, develop shortness of breath, life threatening emergency, suicidal or homicidal thoughts you must seek  medical attention immediately by calling 911 or calling your MD immediately  if symptoms less severe.  You Must read complete instructions/literature along with all the possible adverse reactions/side effects for all the Medicines you take and that have been prescribed to you. Take any new Medicines after you have completely understood and accpet all the possible adverse reactions/side effects.   Do not drive, operating heavy machinery, perform activities at heights, swimming or participation in water activities or provide baby sitting services if your were admitted for syncope or siezures until you have seen by Primary MD or a Neurologist and advised to do so again.  Do not drive when taking Pain medications.    Do not take more than prescribed Pain, Sleep and Anxiety Medications  Special Instructions: If you have smoked or chewed Tobacco  in the last 2 yrs please stop smoking, stop any regular Alcohol  and or any Recreational drug use.  Wear Seat belts while driving.   Please note  You were cared for by a hospitalist during your hospital stay. If you have any questions about your discharge medications or the care you received while you were in the hospital after you are discharged, you can call the unit and asked to speak with the hospitalist on call if the hospitalist that took care of you is not available. Once you are discharged, your primary care physician  will handle any further medical issues. Please note that NO REFILLS for any discharge medications will be authorized once you are discharged, as it is imperative that you return to your primary care physician (or establish a relationship with a primary care physician if you do not have one) for your aftercare needs so that they can reassess your need for medications and monitor your lab values.     Increase activity slowly    Complete by:  As directed             Medication List    TAKE these medications        acetaminophen 500 MG tablet  Commonly known as:  TYLENOL  Take 500 mg by mouth every 6 (six) hours as needed for moderate pain (pain).     alendronate 70 MG tablet  Commonly known as:  FOSAMAX  Take 70 mg by mouth once a week. On Wednesday. Take with a full glass of water on an empty stomach.     amLODipine 2.5 MG tablet  Commonly known as:  NORVASC  Take 1 tablet (2.5 mg total) by mouth 2 (two) times daily.     aspirin 81 MG tablet  Take 81 mg by mouth at bedtime.     cetirizine 10 MG tablet  Commonly known as:  ZYRTEC  Take 10 mg by mouth daily as needed for allergies (allergies).     CITRACAL PO  Take 1 tablet by mouth daily.     CRESTOR 5 MG tablet  Generic drug:  rosuvastatin  Take 5 mg by mouth 2 (two) times a week.     famotidine 10 MG tablet  Commonly known as:  PEPCID  Take 10 mg by mouth 2 (two) times daily.     folic acid 1 MG tablet  Commonly known as:  FOLVITE  Take 2 mg by mouth daily.     gabapentin 300 MG capsule  Commonly known as:  NEURONTIN  Take 1 capsule by mouth 3 (three) times daily.     levofloxacin 500 MG tablet  Commonly known as:  LEVAQUIN  Take 1 tablet (500 mg total) by mouth daily.     magnesium hydroxide 400 MG/5ML suspension  Commonly known as:  MILK OF MAGNESIA  Take 30 mLs by mouth daily as needed for mild constipation (constipation).     meclizine 12.5 MG tablet  Commonly known as:  ANTIVERT  Take 1 tablet (12.5 mg  total) by mouth 2 (two) times daily as needed for dizziness.     methocarbamol 500 MG tablet  Commonly known as:  ROBAXIN  Take 1 tablet (500 mg total) by mouth every 8 (eight) hours as needed for muscle spasms.     methotrexate 2.5 MG tablet  Commonly known as:  RHEUMATREX  Take 25 mg by mouth once a week. Takes 10 tablets on Sunday. Caution:Chemotherapy. Protect from light.     MUSCLE RUB 10-15 % Crea  Apply 1 application topically daily as needed for muscle pain (muscle pain).     oxyCODONE 5 MG immediate release tablet  Commonly known as:  Oxy IR/ROXICODONE  Take 1 tablet (5 mg total) by mouth every 8 (eight) hours as needed for severe pain.     polyethylene glycol packet  Commonly known as:  MIRALAX / GLYCOLAX  Take 17 g by mouth every other day.     predniSONE 1 MG tablet  Commonly known as:  DELTASONE  Take 3 mg by mouth daily with breakfast.     timolol 0.5 % ophthalmic solution  Commonly known as:  TIMOPTIC  Place 1 drop into both eyes 2 (two) times daily.          Diet and Activity recommendation: See Discharge Instructions above   Consults obtained -  None   Major procedures and Radiology Reports - PLEASE review detailed and final reports for all details, in brief -      Dg Chest 2 View  12/18/2014  CLINICAL DATA:  Left lower flank pain. EXAM: CHEST  2 VIEW COMPARISON:  03/15/2013 FINDINGS: Right-sided pacemaker unchanged. Lungs are adequately inflated with mild opacification over the right midlung and left retrocardiac region as cannot exclude atelectasis versus infection. No evidence of effusion. Stable cardiomegaly. Degenerative change of the spine and shoulders. IMPRESSION: Mild opacification over the right midlung and left retrocardiac region which may be due to atelectasis versus infection. Stable cardiomegaly. Electronically Signed   By: Marin Olp M.D.   On: 12/18/2014 15:31   Dg Thoracic Spine 2 View  12/18/2014  CLINICAL DATA:  Mid to lower  back pain, slightly worse on the left side. EXAM: THORACIC SPINE 2 VIEWS ; LUMBAR SPINE - COMPLETE 4+ VIEW COMPARISON:  CT of the thoracic spine dated 07/14/2014 FINDINGS: There is no evidence of thoracic whole lumbosacral spine fracture. There are 5 non-rib-bearing vertebral bodies with transitional anatomy of L5 vertebral body. There is exaggerated thoracic kyphosis, likely related to moderate to severe osteoarthritic changes of the lower thoracic spine. Multilevel osteoarthritic changes are also seen in the lumbosacral spine. No other significant bone abnormalities are identified. Vascular calcifications are noted. Partially visualized cardiac pacemaker leads are seen. IMPRESSION: No evidence of fracture or subluxation of the thoracic or lumbosacral spine. Osteoarthritic changes of the lower thoracic and lumbosacral spine. Electronically Signed   By: Fidela Salisbury M.D.   On: 12/18/2014 13:39   Dg Lumbar Spine Complete  12/18/2014  CLINICAL DATA:  Mid to lower back pain, slightly worse on the left side. EXAM: THORACIC SPINE 2 VIEWS ; LUMBAR SPINE - COMPLETE 4+ VIEW COMPARISON:  CT of  the thoracic spine dated 07/14/2014 FINDINGS: There is no evidence of thoracic whole lumbosacral spine fracture. There are 5 non-rib-bearing vertebral bodies with transitional anatomy of L5 vertebral body. There is exaggerated thoracic kyphosis, likely related to moderate to severe osteoarthritic changes of the lower thoracic spine. Multilevel osteoarthritic changes are also seen in the lumbosacral spine. No other significant bone abnormalities are identified. Vascular calcifications are noted. Partially visualized cardiac pacemaker leads are seen. IMPRESSION: No evidence of fracture or subluxation of the thoracic or lumbosacral spine. Osteoarthritic changes of the lower thoracic and lumbosacral spine. Electronically Signed   By: Fidela Salisbury M.D.   On: 12/18/2014 13:39    Micro Results     No results found for  this or any previous visit (from the past 240 hour(s)).     Today   Subjective:   Carol Bright today has no headache,no chest abdominal pain,no new weakness tingling or numbness, feels much better  home today, lower back pain significantly improved.  Objective:   Blood pressure 130/53, pulse 61, temperature 98.1 F (36.7 C), temperature source Oral, resp. rate 22, height 4\' 11"  (1.499 m), weight 60.8 kg (134 lb 0.6 oz), SpO2 100 %.   Intake/Output Summary (Last 24 hours) at 12/20/14 1404 Last data filed at 12/19/14 1410  Gross per 24 hour  Intake    200 ml  Output    250 ml  Net    -50 ml    Exam Awake Alert, Oriented x 3, No new F.N deficits, Normal affect Maybee.AT,PERRAL Supple Neck,No JVD, No cervical lymphadenopathy appriciated.  Symmetrical Chest wall movement, Good air movement bilaterally, clear to auscultation bilaterally RRR,No Gallops,Rubs or new Murmurs, No Parasternal Heave +ve B.Sounds, Abd Soft, Non tender, No organomegaly appriciated, No rebound -guarding or rigidity. No Cyanosis, Clubbing or edema, No new Rash or bruise, right thoracic para vertebral tenderness is significantly improved.  Data Review   CBC w Diff: Lab Results  Component Value Date   WBC 6.2 12/18/2014   HGB 11.3* 12/18/2014   HCT 35.9* 12/18/2014   PLT 342 12/18/2014   LYMPHOPCT 43 12/18/2014   MONOPCT 5 12/18/2014   EOSPCT 4 12/18/2014   BASOPCT 1 12/18/2014    CMP: Lab Results  Component Value Date   NA 142 12/18/2014   K 3.7 12/18/2014   CL 112* 12/18/2014   CO2 21* 12/18/2014   BUN 7 12/18/2014   CREATININE 0.80 12/18/2014   PROT 8.0 06/28/2014   ALBUMIN 4.0 06/28/2014   BILITOT 0.2* 06/28/2014   ALKPHOS 80 06/28/2014   AST 17 06/28/2014   ALT 13* 06/28/2014  .   Total Time in preparing paper work, data evaluation and todays exam - 35 minutes  Kayron Hicklin M.D on 12/20/2014 at 2:04 PM  [facility]   Office  [phone]

## 2014-12-20 NOTE — Progress Notes (Signed)
Brace brought in by family, is not a vendor brace. Dr. Waldron Labs stated may obtain outpatient after d/c home

## 2014-12-20 NOTE — Care Management Obs Status (Signed)
[place] NOTIFICATION   Patient Details  Name: Carol Bright MRN: [id number] Date of Birth: [birth year]   Medicare Observation Status Notification Given:  Yes    Erenest Rasher, RN 12/20/2014, 3:38 PM

## 2014-12-20 NOTE — Care Management Note (Signed)
Case Management Note  Patient Details  Name: Carol Bright MRN: [id number] Date of Birth: 1930/09/23  Subjective/Objective:          URI          Action/Plan: NCM spoke to pt and gave permission to speak to dtr. Dtr states pt had [facility] in the past for [facility]. Requesting [facility] for HH. Contacted [facility] with new referral. Pt received a RW in 2014, does not qualify for new RW. Will have to pay out of pocket. Family will assist pt as needed.   Expected Discharge Date:  12/20/14               Expected Discharge Plan:  [place]  In-House Referral:     Discharge planning Services  CM Consult  Post Acute Care Choice:  Home Health Choice offered to:  Patient, Adult Children   HH Arranged:  PT [place] Agency:  [place]  Status of Service:  Completed, signed off  Medicare Important Message Given:    Date Medicare IM Given:    Medicare IM give by:    Date Additional Medicare IM Given:    Additional Medicare Important Message give by:     If discussed at [place] of Stay Meetings, dates discussed:    Additional Comments:  Erenest Rasher, RN 12/20/2014, 3:35 PM

## 2014-12-20 NOTE — Progress Notes (Signed)
Provided with Rx for pain management for her drive home - Oxy 5 mg PO administered. Discharge instructions reviewed the patient and her daughter including AVS printout, prescriptions, need to call and make an appointment with her PCP Monday AM. Taken by RN via w/c to meet her daughter Charm Rings has taken her personal possessions out to the car] for the drive home.

## 2014-12-20 NOTE — Discharge Instructions (Signed)
Follow with Primary MD Donnajean Lopes, MD in 7 days   Get CBC, CMP,checked  by Primary MD next visit.    Activity: As tolerated with Full fall precautions use walker/cane & assistance as needed   Disposition Home    Diet: Heart Healthy  , with feeding assistance and aspiration precautions.  For Heart failure patients - Check your Weight same time everyday, if you gain over 2 pounds, or you develop in leg swelling, experience more shortness of breath or chest pain, call your Primary MD immediately. Follow Cardiac Low Salt Diet and 1.5 lit/day fluid restriction.   On your next visit with your primary care physician please Get Medicines reviewed and adjusted.   Please request your Prim.MD to go over all Hospital Tests and Procedure/Radiological results at the follow up, please get all Hospital records sent to your Prim MD by signing hospital release before you go home.   If you experience worsening of your admission symptoms, develop shortness of breath, life threatening emergency, suicidal or homicidal thoughts you must seek medical attention immediately by calling 911 or calling your MD immediately  if symptoms less severe.  You Must read complete instructions/literature along with all the possible adverse reactions/side effects for all the Medicines you take and that have been prescribed to you. Take any new Medicines after you have completely understood and accpet all the possible adverse reactions/side effects.   Do not drive, operating heavy machinery, perform activities at heights, swimming or participation in water activities or provide baby sitting services if your were admitted for syncope or siezures until you have seen by Primary MD or a Neurologist and advised to do so again.  Do not drive when taking Pain medications.    Do not take more than prescribed Pain, Sleep and Anxiety Medications  Special Instructions: If you have smoked or chewed Tobacco  in the last 2 yrs  please stop smoking, stop any regular Alcohol  and or any Recreational drug use.  Wear Seat belts while driving.   Please note  You were cared for by a hospitalist during your hospital stay. If you have any questions about your discharge medications or the care you received while you were in the hospital after you are discharged, you can call the unit and asked to speak with the hospitalist on call if the hospitalist that took care of you is not available. Once you are discharged, your primary care physician will handle any further medical issues. Please note that NO REFILLS for any discharge medications will be authorized once you are discharged, as it is imperative that you return to your primary care physician (or establish a relationship with a primary care physician if you do not have one) for your aftercare needs so that they can reassess your need for medications and monitor your lab values.

## 2014-12-25 ENCOUNTER — Other Ambulatory Visit: Payer: Self-pay | Admitting: Internal Medicine

## 2015-04-19 ENCOUNTER — Ambulatory Visit (INDEPENDENT_AMBULATORY_CARE_PROVIDER_SITE_OTHER): Payer: Medicare Other | Admitting: *Deleted

## 2015-04-19 DIAGNOSIS — Z950 Presence of cardiac pacemaker: Secondary | ICD-10-CM

## 2015-04-19 DIAGNOSIS — R001 Bradycardia, unspecified: Secondary | ICD-10-CM | POA: Diagnosis not present

## 2015-04-19 LAB — CUP PACEART INCLINIC DEVICE CHECK
Brady Statistic RA Percent Paced: 19 %
Brady Statistic RV Percent Paced: 1 % — CL
Implantable Lead Implant Date: 20140115
Implantable Lead Location: 753860
Implantable Lead Serial Number: 29298114
Implantable Lead Serial Number: 29326965
Lead Channel Impedance Value: 620 Ohm
Lead Channel Pacing Threshold Amplitude: 0.7 V
Lead Channel Pacing Threshold Amplitude: 1 V
Lead Channel Pacing Threshold Pulse Width: 0.4 ms
Lead Channel Pacing Threshold Pulse Width: 0.4 ms
Lead Channel Setting Pacing Amplitude: 2 V
Lead Channel Setting Sensing Sensitivity: 2.5 mV
MDC IDC LEAD IMPLANT DT: 20140115
MDC IDC LEAD LOCATION: 753859
MDC IDC LEAD MODEL: 4135
MDC IDC LEAD MODEL: 4136
MDC IDC MSMT LEADCHNL RA IMPEDANCE VALUE: 421 Ohm
MDC IDC MSMT LEADCHNL RA SENSING INTR AMPL: 3.8 mV
MDC IDC MSMT LEADCHNL RV SENSING INTR AMPL: 17.6 mV
MDC IDC SESS DTM: 20170327040000
MDC IDC SET LEADCHNL RV PACING AMPLITUDE: 2.4 V
MDC IDC SET LEADCHNL RV PACING PULSEWIDTH: 0.4 ms
Pulse Gen Serial Number: 111372

## 2015-04-19 NOTE — Progress Notes (Signed)
Pacemaker check in clinic. Normal device function. Thresholds, sensing, impedances consistent with previous measurements. Device programmed to maximize longevity. 2 mode switches (<1%), available EGM suggests AT, +ASA 81mg . No high ventricular rates noted. Device programmed at appropriate safety margins. Histogram distribution appropriate for patient activity level. Device programmed to optimize intrinsic conduction. Estimated longevity 1.5 years. Patient education completed. ROV with GT in 6 months.

## 2015-05-06 ENCOUNTER — Encounter: Payer: Self-pay | Admitting: Internal Medicine

## 2015-06-10 ENCOUNTER — Telehealth: Payer: Self-pay | Admitting: *Deleted

## 2015-06-10 MED ORDER — AMLODIPINE BESYLATE 2.5 MG PO TABS: 2.5000 mg | ORAL_TABLET | Freq: Two times a day (BID) | ORAL | Status: DC

## 2015-06-10 NOTE — Telephone Encounter (Signed)
Ok to refill amlodipine for patient? She has not seen Dr Harrington Challenger for quite some time. Please advise. Thanks, MI

## 2015-11-10 ENCOUNTER — Telehealth: Payer: Self-pay | Admitting: *Deleted

## 2015-11-10 MED ORDER — ROSUVASTATIN CALCIUM 5 MG PO TABS: 5.0000 mg | ORAL_TABLET | ORAL | 0 refills | Status: DC

## 2015-11-10 NOTE — Telephone Encounter (Signed)
Spoke with patient's daughter, she asked for refill for cholesterol medication.  Pt takes rosuvastatin 5 mg twice weekly.   Scheduled appointment to come to [facility] on 12/20/15.  Sent refill for 20 tablets to her [employer].

## 2015-11-14 ENCOUNTER — Encounter (HOSPITAL_COMMUNITY): Payer: Self-pay | Admitting: Emergency Medicine

## 2015-11-14 ENCOUNTER — Emergency Department (HOSPITAL_COMMUNITY): Payer: Medicare Other

## 2015-11-14 ENCOUNTER — Emergency Department (HOSPITAL_COMMUNITY)
Admission: EM | Admit: 2015-11-14 | Discharge: 2015-11-14 | Disposition: A | Discharge status: Home / Self Care | Payer: Medicare Other | Attending: Emergency Medicine | Admitting: Emergency Medicine

## 2015-11-14 DIAGNOSIS — Z950 Presence of cardiac pacemaker: Secondary | ICD-10-CM | POA: Diagnosis not present

## 2015-11-14 DIAGNOSIS — Z8673 Personal history of transient ischemic attack (TIA), and cerebral infarction without residual deficits: Secondary | ICD-10-CM | POA: Diagnosis not present

## 2015-11-14 DIAGNOSIS — I2510 Atherosclerotic heart disease of native coronary artery without angina pectoris: Secondary | ICD-10-CM | POA: Diagnosis not present

## 2015-11-14 DIAGNOSIS — Z7982 Long term (current) use of aspirin: Secondary | ICD-10-CM | POA: Insufficient documentation

## 2015-11-14 DIAGNOSIS — Z853 Personal history of malignant neoplasm of breast: Secondary | ICD-10-CM | POA: Diagnosis not present

## 2015-11-14 DIAGNOSIS — R079 Chest pain, unspecified: Secondary | ICD-10-CM | POA: Diagnosis not present

## 2015-11-14 DIAGNOSIS — R109 Unspecified abdominal pain: Secondary | ICD-10-CM | POA: Diagnosis present

## 2015-11-14 DIAGNOSIS — I10 Essential (primary) hypertension: Secondary | ICD-10-CM | POA: Diagnosis not present

## 2015-11-14 DIAGNOSIS — F17210 Nicotine dependence, cigarettes, uncomplicated: Secondary | ICD-10-CM | POA: Diagnosis not present

## 2015-11-14 DIAGNOSIS — B0229 Other postherpetic nervous system involvement: Secondary | ICD-10-CM | POA: Diagnosis not present

## 2015-11-14 DIAGNOSIS — Z951 Presence of aortocoronary bypass graft: Secondary | ICD-10-CM | POA: Insufficient documentation

## 2015-11-14 LAB — URINALYSIS, ROUTINE W REFLEX MICROSCOPIC
Bilirubin Urine: NEGATIVE
GLUCOSE, UA: NEGATIVE mg/dL
HGB URINE DIPSTICK: NEGATIVE
Ketones, ur: NEGATIVE mg/dL
Nitrite: NEGATIVE
Protein, ur: NEGATIVE mg/dL
SPECIFIC GRAVITY, URINE: 1.012 (ref 1.005–1.030)
pH: 6.5 (ref 5.0–8.0)

## 2015-11-14 LAB — COMPREHENSIVE METABOLIC PANEL
ALBUMIN: 3.8 g/dL (ref 3.5–5.0)
ALT: 9 U/L — AB (ref 14–54)
AST: 19 U/L (ref 15–41)
Alkaline Phosphatase: 66 U/L (ref 38–126)
Anion gap: 7 (ref 5–15)
BUN: 7 mg/dL (ref 6–20)
CHLORIDE: 110 mmol/L (ref 101–111)
CO2: 24 mmol/L (ref 22–32)
CREATININE: 0.77 mg/dL (ref 0.44–1.00)
Calcium: 9.1 mg/dL (ref 8.9–10.3)
GFR calc non Af Amer: >60 mL/min (ref >60)
GLUCOSE: 107 mg/dL — AB (ref 65–99)
Potassium: 3.9 mmol/L (ref 3.5–5.1)
SODIUM: 141 mmol/L (ref 135–145)
Total Bilirubin: 0.4 mg/dL (ref 0.3–1.2)
Total Protein: 7.5 g/dL (ref 6.5–8.1)

## 2015-11-14 LAB — URINE MICROSCOPIC-ADD ON

## 2015-11-14 LAB — CBC
HCT: 37.3 % (ref 36.0–46.0)
HEMOGLOBIN: 12 g/dL (ref 12.0–15.0)
MCH: 29.3 pg (ref 26.0–34.0)
MCHC: 32.2 g/dL (ref 30.0–36.0)
MCV: 91.2 fL (ref 78.0–100.0)
Platelets: 278 10*3/uL (ref 150–400)
RBC: 4.09 MIL/uL (ref 3.87–5.11)
RDW: 14.2 % (ref 11.5–15.5)
WBC: 9.3 10*3/uL (ref 4.0–10.5)

## 2015-11-14 LAB — LIPASE, BLOOD: LIPASE: 23 U/L (ref 11–51)

## 2015-11-14 MED ORDER — GABAPENTIN 100 MG PO CAPS
100.0000 mg | ORAL_CAPSULE | Freq: Once | ORAL | Status: AC
  Administered 2015-11-14: 100 mg via ORAL
  Filled 2015-11-14: qty 1

## 2015-11-14 MED ORDER — IOPAMIDOL (ISOVUE-300) INJECTION 61%
INTRAVENOUS | Status: AC
  Administered 2015-11-14: 100 mL
  Filled 2015-11-14: qty 100

## 2015-11-14 MED ORDER — ACYCLOVIR 400 MG PO TABS: 800.0000 mg | ORAL_TABLET | Freq: Every day | ORAL | 0 refills | Status: AC

## 2015-11-14 MED ORDER — HYDROCODONE-ACETAMINOPHEN 5-325 MG PO TABS: 1.0000 | ORAL_TABLET | Freq: Three times a day (TID) | ORAL | 0 refills | Status: AC | PRN

## 2015-11-14 MED ORDER — ACETAMINOPHEN 500 MG PO TABS
1000.0000 mg | ORAL_TABLET | Freq: Once | ORAL | Status: AC
  Administered 2015-11-14: 1000 mg via ORAL
  Filled 2015-11-14: qty 2

## 2015-11-14 MED ORDER — FENTANYL CITRATE (PF) 100 MCG/2ML IJ SOLN
50.0000 ug | Freq: Once | INTRAMUSCULAR | Status: AC
  Administered 2015-11-14: 50 ug via INTRAVENOUS
  Filled 2015-11-14: qty 2

## 2015-11-14 NOTE — ED Provider Notes (Signed)
[facility] DEPT Provider Note   CSN: [id number] Arrival date & time: 11/14/15  1020     History   Chief Complaint Chief Complaint  Patient presents with  . Abdominal Pain    left side    HPI Carol Bright is a 82 y.o. female.  The history is provided by the patient.  Abdominal Pain   This is a new problem. Episode onset: 4 days; worsened last night. The problem occurs constantly. The problem has been rapidly improving. Pain location: left flank. The pain is moderate. Associated symptoms include nausea (last night), constipation and dysuria (4 days). Pertinent negatives include fever, diarrhea, hematochezia, melena, vomiting, hematuria and arthralgias. Exacerbated by: movement. Relieved by: lying still.   Patient reports prior history of shingles to the left chest and back. Currently she denies any lesions. Patient also denies any trauma.  Past Medical History:  Diagnosis Date  . Borderline diabetes   . Breast cancer ([place]) [place]  . Cataract    [place]il  . Daily headache    "recently" (02/06/2012)  . Diverticulosis   . Dyspnea on exertion    "off and on for years; just worse recently" (02/06/2012)  . External hemorrhoids   . GERD (gastroesophageal reflux disease)   . Guaiac + stool 2005   "went to Dr. Henrene Pastor; took out polyps" (02/06/2012)  . H/O hiatal hernia   . History of bronchitis    "occasionally" (02/06/2012)  . Hyperlipidemia   . Hypertension   . Orthostatic lightheadedness   . Palpitations   . Rheumatoid arthritis(714.0)    PT IS FOLLOWED BY DR [place]  . Sinus bradycardia 02/04/2012  . Syncope and collapse 02/04/2012   "first time ever" (02/06/2012)    Patient Active Problem List   Diagnosis Date Noted  . Back pain 12/18/2014  . RUQ abdominal pain 07/23/2014  . Nausea with vomiting 07/23/2014  . CN (constipation) 07/23/2014  . Muscle pain 02/10/2014  . Dizziness 01/26/2014  . TIA (transient  ischemic attack) 03/17/2013    Class: Acute  . CAD (coronary artery disease) of artery bypass graft 03/17/2013    Class: Chronic  . Dysphagia, pharyngoesophageal phase 03/17/2013    Class: Chronic  . Altered mental status 03/15/2013    Class: Acute  . Pacemaker 05/18/2012  . Sinus bradycardia 02/04/2012  . Dyspnea on exertion   . Orthostatic lightheadedness   . Palpitations   . Syncope   . Chest pain 12/27/2010  . Dyslipidemia 12/27/2010  . HTN (hypertension) 12/27/2010  . Tobacco abuse 12/27/2010    Past Surgical History:  Procedure Laterality Date  . CARDIAC CATHETERIZATION  02/05/2012   "first one ever" (02/06/2012)  . COLONOSCOPY  11/2011  . CYSTECTOMY  1993   Left shoulder  . LEFT AND RIGHT HEART CATHETERIZATION WITH CORONARY ANGIOGRAM N/A 02/05/2012   Procedure: LEFT AND RIGHT HEART CATHETERIZATION WITH CORONARY ANGIOGRAM;  Surgeon: Sherren Mocha, MD;  Location[facility] CATH LAB;  Service: Cardiovascular;  Laterality: N/A;  . MASTECTOMY  1995   Left  . PERMANENT PACEMAKER INSERTION N/A 02/07/2012   Procedure: PERMANENT PACEMAKER INSERTION;  Surgeon: Evans Lance, MD;  Location: [facility] CATH LAB;  Service: Cardiovascular;  Laterality: N/A;  . POLYPECTOMY  ~2005; ~2008  . TONSILLECTOMY  1950's  . VAGINAL HYSTERECTOMY  1970    OB History    No data available       Home Medications    Prior to  Admission medications   Medication Sig Start Date End Date Taking? Authorizing Provider  acetaminophen (TYLENOL) 500 MG tablet Take 500 mg by mouth every 6 (six) hours as needed for moderate pain (pain).   Yes Historical Provider, MD  alendronate (FOSAMAX) 70 MG tablet Take 70 mg by mouth once a week. On Wednesday. Take with a full glass of water on an empty stomach.   Yes Historical Provider, MD  amLODipine (NORVASC) 2.5 MG tablet Take 1 tablet (2.5 mg total) by mouth 2 (two) times daily. 06/10/15  Yes Fay Records, MD  aspirin 81 MG tablet Take 81 mg by mouth at bedtime.   Yes  Historical Provider, MD  Calcium Citrate (CITRACAL PO) Take 1 tablet by mouth daily.    Yes Historical Provider, MD  cetirizine (ZYRTEC) 10 MG tablet Take 10 mg by mouth daily as needed for allergies (allergies).    Yes Historical Provider, MD  famotidine (PEPCID) 10 MG tablet Take 10 mg by mouth 2 (two) times daily.   Yes Historical Provider, MD  folic acid (FOLVITE) 1 MG tablet Take 2 mg by mouth daily.   Yes Historical Provider, MD  magnesium hydroxide (MILK OF MAGNESIA) 400 MG/5ML suspension Take 30 mLs by mouth daily as needed for mild constipation (constipation).   Yes Historical Provider, MD  Menthol-Methyl Salicylate (MUSCLE RUB) 10-15 % CREA Apply 1 application topically daily as needed for muscle pain (muscle pain).   Yes Historical Provider, MD  methocarbamol (ROBAXIN) 500 MG tablet Take 1 tablet (500 mg total) by mouth every 8 (eight) hours as needed for muscle spasms. 12/20/14  Yes Albertine Patricia, MD  methotrexate (RHEUMATREX) 2.5 MG tablet Take 25 mg by mouth once a week. Takes 10 tablets on Sunday. Caution:Chemotherapy. Protect from light.   Yes Historical Provider, MD  polyethylene glycol (MIRALAX / GLYCOLAX) packet Take 17 g by mouth every other day.   Yes Historical Provider, MD  predniSONE (DELTASONE) 1 MG tablet Take 3 mg by mouth daily with breakfast.   Yes Historical Provider, MD  rosuvastatin (CRESTOR) 5 MG tablet Take 1 tablet (5 mg total) by mouth 2 (two) times a week. 11/11/15  Yes Fay Records, MD  timolol (TIMOPTIC) 0.5 % ophthalmic solution Place 1 drop into both eyes 2 (two) times daily.  12/15/13  Yes Historical Provider, MD  Vitamin D, Ergocalciferol, (DRISDOL) 50000 units CAPS capsule Take 50,000 Units by mouth every 7 (seven) days.   Yes Historical Provider, MD  acyclovir (ZOVIRAX) 400 MG tablet Take 2 tablets (800 mg total) by mouth 5 (five) times daily. 11/14/15 11/21/15  Fatima Blank, MD  gabapentin (NEURONTIN) 300 MG capsule Take 1 capsule by mouth 3  (three) times daily. 08/14/14   Historical Provider, MD  HYDROcodone-acetaminophen (NORCO/VICODIN) 5-325 MG tablet Take 1 tablet by mouth every 8 (eight) hours as needed for severe pain (That is not improved by your scheduled acetaminophen regimen). Please do not exceed 4000 mg of acetaminophen (Tylenol) a 24-hour period. Please note that he may be prescribed additional medicine that contains acetaminophen. 11/14/15 11/19/15  Fatima Blank, MD  levofloxacin (LEVAQUIN) 500 MG tablet Take 1 tablet (500 mg total) by mouth daily. Patient not taking: Reported on 11/14/2015 12/20/14   Silver Huguenin Elgergawy, MD  meclizine (ANTIVERT) 12.5 MG tablet Take 1 tablet (12.5 mg total) by mouth 2 (two) times daily as needed for dizziness. Patient not taking: Reported on 11/14/2015 01/26/14   Isaiah Serge, NP  oxyCODONE (OXY IR/ROXICODONE)  5 MG immediate release tablet Take 1 tablet (5 mg total) by mouth every 8 (eight) hours as needed for severe pain. Patient not taking: Reported on 11/14/2015 12/20/14   Albertine Patricia, MD    Family History Family History  Problem Relation Age of Onset  . Cancer Mother     LEFT BREAST REMOVED  . Heart attack Mother   . Heart disease Mother   . Breast cancer Mother     Social History Social History  Substance Use Topics  . Smoking status: Current Every Day Smoker    Packs/day: 0.50    Years: 50.00    Types: Cigarettes  . Smokeless tobacco: Never Used     Comment: 02/06/2012 'stopped smoking 4-5 times"; offered smoking cessation materials; pt declines  . Alcohol use 0.0 oz/week     Comment: 02/06/2012 "occasional; "like a holiday"     Allergies   Morphine and related and Penicillins   Review of Systems Review of Systems  Constitutional: Negative for chills and fever.  HENT: Negative for ear pain and sore throat.   Eyes: Negative for pain and visual disturbance.  Respiratory: Negative for cough and shortness of breath.   Cardiovascular: Negative for  chest pain and palpitations.  Gastrointestinal: Positive for abdominal pain, constipation and nausea (last night). Negative for diarrhea, hematochezia, melena and vomiting.  Genitourinary: Positive for dysuria (4 days). Negative for hematuria.  Musculoskeletal: Negative for arthralgias and back pain.  Skin: Negative for color change and rash.  Neurological: Negative for seizures and syncope.  All other systems reviewed and are negative.    Physical Exam Updated Vital Signs BP 168/71 (BP Location: Right Arm)   Pulse 61   Temp 98.1 F (36.7 C) (Oral)   Resp 18   Ht 4\' 11"  (1.499 m)   Wt 135 lb (61.2 kg)   SpO2 100%   BMI 27.27 kg/m   Physical Exam  Constitutional: She is oriented to person, place, and time. She appears well-developed and well-nourished. No distress.  HENT:  Head: Normocephalic and atraumatic.  Nose: Nose normal.  Eyes: Conjunctivae and EOM are normal. Pupils are equal, round, and reactive to light. Right eye exhibits no discharge. Left eye exhibits no discharge. No scleral icterus.  Neck: Normal range of motion. Neck supple.  Cardiovascular: Normal rate and regular rhythm.  Exam reveals no gallop and no friction rub.   No murmur heard. Pulmonary/Chest: Effort normal and breath sounds normal. No stridor. No respiratory distress. She has no rales.     She exhibits tenderness.  Abdominal: Soft. She exhibits no distension. There is tenderness in the left upper quadrant and left lower quadrant. There is CVA tenderness (left). There is no rigidity, no rebound, no guarding, no tenderness at McBurney's point and negative Murphy's sign.    Musculoskeletal: She exhibits no edema or tenderness.  Neurological: She is alert and oriented to person, place, and time.  Skin: Skin is warm and dry. No rash noted. She is not diaphoretic. No erythema.  Extreme tenderness with lite tactile to areas noted above in images  Psychiatric: She has a normal mood and affect.  Vitals  reviewed.    ED Treatments / Results  Labs (all labs ordered are listed, but only abnormal results are displayed) Labs Reviewed  COMPREHENSIVE METABOLIC PANEL - Abnormal; Notable for the following:       Result Value   Glucose, Bld 107 (*)    ALT 9 (*)    All other components within  normal limits  URINALYSIS, ROUTINE W REFLEX MICROSCOPIC (NOT AT [facility]) - Abnormal; Notable for the following:    Leukocytes, UA SMALL (*)    All other components within normal limits  URINE MICROSCOPIC-ADD ON - Abnormal; Notable for the following:    Squamous Epithelial / LPF 6-30 (*)    Bacteria, UA FEW (*)    Casts HYALINE CASTS (*)    All other components within normal limits  LIPASE, BLOOD  CBC    EKG  EKG Interpretation  Date/Time:  Sunday November 14 2015 14:43:19 EDT Ventricular Rate:  60 PR Interval:    QRS Duration: 91 QT Interval:  401 QTC Calculation: 401 R Axis:   79 Text Interpretation:  Atrial-paced rhythm Otherwise no significant change Confirmed by [facility] MD, PEDRO ([id number]) on 11/14/2015 2:50:21 PM       Radiology Dg Chest 2 View  Result Date: 11/14/2015 CLINICAL DATA:  82 year old female with history of chest pain shortness of breath. Sharp pains in the left side of the chest. EXAM: CHEST  2 VIEW COMPARISON:  Chest x-ray 12/18/2014. FINDINGS: Mild diffuse peribronchial cuffing which appears to be chronic. No acute consolidative airspace disease. Linear opacities in the lower lobes of the lungs bilaterally (left greater than right), most compatible with areas of chronic scarring or subsegmental atelectasis. No pleural effusions. No pneumothorax. No evidence of pulmonary edema. Mild cardiomegaly with prominence of the left ventricular contour, suggesting underlying left ventricular hypertrophy. The patient is rotated to the left on today's exam, resulting in distortion of the mediastinal contours and reduced diagnostic sensitivity and specificity for mediastinal pathology.  Atherosclerosis in the thoracic aorta. Right-sided pacemaker device in place with lead tips projecting over the expected location of the right atrium and right ventricular apex. Visualized bony thorax is grossly intact. IMPRESSION: 1. No radiographic evidence of acute cardiopulmonary disease. 2. Diffuse peribronchial cuffing which appears to be chronic, suggesting chronic bronchitis. 3. Cardiomegaly with evidence of left ventricular hypertrophy, similar prior studies, as above. 4. Aortic atherosclerosis. Electronically Signed   By: Vinnie Langton M.D.   On: 11/14/2015 14:38   Ct Abdomen Pelvis W Contrast  Result Date: 11/14/2015 CLINICAL DATA:  Left lower quadrant pain for 2 days EXAM: CT ABDOMEN AND PELVIS WITH CONTRAST TECHNIQUE: Multidetector CT imaging of the abdomen and pelvis was performed using the standard protocol following bolus administration of intravenous contrast. CONTRAST:  164mL ISOVUE-300 IOPAMIDOL (ISOVUE-300) INJECTION 61% COMPARISON:  06/28/2014 FINDINGS: Lower chest: Lung bases demonstrate some dependent atelectatic changes. No focal infiltrate is seen. Hepatobiliary: The liver is diffusely fatty infiltrated. The gallbladder is within normal limits. A tiny hypodensity is noted in the right lobe near the dome of the liver consistent with a small cyst. This is stable from the prior exam. Pancreas: Unremarkable. No pancreatic ductal dilatation or surrounding inflammatory changes. Spleen: Normal in size without focal abnormality. Adrenals/Urinary Tract: Right renal cyst is noted stable from the prior exam. No obstructive changes are seen. No renal calculi are noted. Bladder is within normal limits. Stomach/Bowel: The appendix is within normal limits. Diverticular change of the colon is seen without evidence of diverticulitis. Vascular/Lymphatic: Aortic atherosclerosis. No enlarged abdominal or pelvic lymph nodes. Reproductive: Status post hysterectomy. No adnexal masses. Other: No abdominal  wall hernia or abnormality. No abdominopelvic ascites. Musculoskeletal: Degenerative changes of lumbar spine are noted. IMPRESSION: Chronic changes as described above. No acute abnormality to account for the patient's clinical symptomatology is noted. Electronically Signed   By: Linus Mako.D.  On: 11/14/2015 13:10    Procedures Procedures (including critical care time)  Medications Ordered in ED Medications  gabapentin (NEURONTIN) capsule 100 mg (100 mg Oral Given 11/14/15 1205)  acetaminophen (TYLENOL) tablet 1,000 mg (1,000 mg Oral Given 11/14/15 1205)  fentaNYL (SUBLIMAZE) injection 50 mcg (50 mcg Intravenous Given 11/14/15 1206)  iopamidol (ISOVUE-300) 61 % injection (100 mLs  Contrast Given 11/14/15 1236)     Initial Impression / Assessment and Plan / ED Course  I have reviewed the triage vital signs and the nursing notes.  Pertinent labs & imaging results that were available during my care of the patient were reviewed by me and considered in my medical decision making (see chart for details).  Clinical Course   Most consistent with early onset shingles. EKG without acute ischemic changes or evidence of pericarditis. Chest x-ray without evidence suggestive of pneumonia, pneumothorax, pneumomediastinum.  No abnormal contour of the mediastinum to suggest dissection. No evidence of acute injuries.Presentation highly inconsistent with ACS. Low suspicion for pulmonary embolism. Presentation not classic for aortic dissection or esophageal perforation.   Left lower quadrant abdominal tenderness. Patient with a history of diverticulosis. CT without evidence of diverticulitis or other acute inflammatory/infectious process.  Patient provided with oral pain medication with some improvement in her symptoms. She is safe for discharge with strict return precautions. We'll provide the patient with prescription for antivirals and pain medication at home. Instructed to follow up closely with her  primary care provider.  Final Clinical Impressions(s) / ED Diagnoses   Final diagnoses:  Chest pain  Post herpetic neuralgia  Left flank pain   Disposition: Discharge  Condition: Good  I have discussed the results, Dx and Tx plan with the patient who expressed understanding and agree(s) with the plan. Discharge instructions discussed at great length. The patient was given strict return precautions who verbalized understanding of the instructions. No further questions at time of discharge.    Discharge Medication List as of 11/14/2015  3:08 PM    START taking these medications   Details  acyclovir (ZOVIRAX) 400 MG tablet Take 2 tablets (800 mg total) by mouth 5 (five) times daily., Starting Sun 11/14/2015, Until Sun 11/21/2015, Print    HYDROcodone-acetaminophen (NORCO/VICODIN) 5-325 MG tablet Take 1 tablet by mouth every 8 (eight) hours as needed for severe pain (That is not improved by your scheduled acetaminophen regimen). Please do not exceed 4000 mg of acetaminophen (Tylenol) a 24-hour period. Please note that he may be prescribed additio nal medicine that contains acetaminophen., Starting Sun 11/14/2015, Until Fri 11/19/2015, Print        Follow Up: Leanna Battles, MD [place] [address] [phone]  In 2 days For close follow up to assess for likely post herpetic neuralgia (possible shingles).      Fatima Blank, MD 11/14/15 [phone]

## 2015-11-14 NOTE — ED Triage Notes (Signed)
Pt. Stated, I was awakened by pain in my left side last night.

## 2015-11-14 NOTE — ED Notes (Signed)
Pt was taken to Xray

## 2015-11-14 NOTE — ED Notes (Signed)
PT returned from Xray 

## 2015-11-14 NOTE — ED Notes (Signed)
Pt returned from CT °

## 2015-11-14 NOTE — ED Notes (Signed)
Shellee Milo, MD at the bedside

## 2015-12-19 NOTE — Progress Notes (Signed)
Cardiology Office Note   Date:  12/20/2015   ID:  Carol Bright, DOB Mar 23, 1930, MRN [id number]  PCP:  Donnajean Lopes, MD  Cardiologist:   Dorris Carnes, MD   F/U of HTN      History of Present Illness: Carol Bright is a 82 y.o. female with a history of CP, HTN, RA   I saw her in 2012  She is also followed by Beckie Salts for [facility] for bradycardia  She saw him in 2016  She denies CP  Breathing is OK  Appetite and wt are down.   Occasional vertigo  Pt did not take BP meds this AM    Still having pain on L side due to shingles     Outpatient Medications Prior to Visit  Medication Sig Dispense Refill  . acetaminophen (TYLENOL) 500 MG tablet Take 500 mg by mouth every 6 (six) hours as needed for moderate pain (pain).    Marland Kitchen alendronate (FOSAMAX) 70 MG tablet Take 70 mg by mouth once a week. On Wednesday. Take with a full glass of water on an empty stomach.    Marland Kitchen amLODipine (NORVASC) 2.5 MG tablet Take 1 tablet (2.5 mg total) by mouth 2 (two) times daily. 90 tablet 2  . aspirin 81 MG tablet Take 81 mg by mouth at bedtime.    . Calcium Citrate (CITRACAL PO) Take 1 tablet by mouth daily.     . cetirizine (ZYRTEC) 10 MG tablet Take 10 mg by mouth daily as needed for allergies (allergies).     . famotidine (PEPCID) 10 MG tablet Take 10 mg by mouth 2 (two) times daily.    . folic acid (FOLVITE) 1 MG tablet Take 2 mg by mouth daily.    Marland Kitchen gabapentin (NEURONTIN) 300 MG capsule Take 1 capsule by mouth 3 (three) times daily.  0  . levofloxacin (LEVAQUIN) 500 MG tablet Take 1 tablet (500 mg total) by mouth daily. 3 tablet 0  . magnesium hydroxide (MILK OF MAGNESIA) 400 MG/5ML suspension Take 30 mLs by mouth daily as needed for mild constipation (constipation).    . meclizine (ANTIVERT) 12.5 MG tablet Take 1 tablet (12.5 mg total) by mouth 2 (two) times daily as needed for dizziness. 20 tablet 0  . Menthol-Methyl Salicylate (MUSCLE RUB) 10-15 % CREA Apply 1 application topically daily as needed for  muscle pain (muscle pain).    . methocarbamol (ROBAXIN) 500 MG tablet Take 1 tablet (500 mg total) by mouth every 8 (eight) hours as needed for muscle spasms. 15 tablet 0  . methotrexate (RHEUMATREX) 2.5 MG tablet Take 25 mg by mouth once a week. Takes 10 tablets on Sunday. Caution:Chemotherapy. Protect from light.    Marland Kitchen oxyCODONE (OXY IR/ROXICODONE) 5 MG immediate release tablet Take 1 tablet (5 mg total) by mouth every 8 (eight) hours as needed for severe pain. 15 tablet 0  . polyethylene glycol (MIRALAX / GLYCOLAX) packet Take 17 g by mouth every other day.    . predniSONE (DELTASONE) 1 MG tablet Take 3 mg by mouth daily with breakfast.    . rosuvastatin (CRESTOR) 5 MG tablet Take 1 tablet (5 mg total) by mouth 2 (two) times a week. 20 tablet 0  . timolol (TIMOPTIC) 0.5 % ophthalmic solution Place 1 drop into both eyes 2 (two) times daily.   0  . Vitamin D, Ergocalciferol, (DRISDOL) 50000 units CAPS capsule Take 50,000 Units by mouth every 7 (seven) days.     No facility-administered medications  prior to visit.      Allergies:   Morphine and related and Penicillins   Past Medical History:  Diagnosis Date  . Borderline diabetes   . Breast cancer ([place]) [place]  . Cataract    Bil  . Daily headache    "recently" (02/06/2012)  . Diverticulosis   . Dyspnea on exertion    "off and on for years; just worse recently" (02/06/2012)  . External hemorrhoids   . GERD (gastroesophageal reflux disease)   . Guaiac + stool 2005   "went to Dr. Henrene Pastor; took out polyps" (02/06/2012)  . H/O hiatal hernia   . History of bronchitis    "occasionally" (02/06/2012)  . Hyperlipidemia   . Hypertension   . Orthostatic lightheadedness   . Palpitations   . Rheumatoid arthritis(714.0)    PT IS FOLLOWED BY DR [place]  . Sinus bradycardia 02/04/2012  . Syncope and collapse 02/04/2012   "first time ever" (02/06/2012)    Past Surgical History:    Procedure Laterality Date  . CARDIAC CATHETERIZATION  02/05/2012   "first one ever" (02/06/2012)  . COLONOSCOPY  11/2011  . CYSTECTOMY  1993   Left shoulder  . LEFT AND RIGHT HEART CATHETERIZATION WITH CORONARY ANGIOGRAM N/A 02/05/2012   Procedure: LEFT AND RIGHT HEART CATHETERIZATION WITH CORONARY ANGIOGRAM;  Surgeon: Sherren Mocha, MD;  Location: [facility] CATH LAB;  Service: Cardiovascular;  Laterality: N/A;  . MASTECTOMY  1995   Left  . PERMANENT PACEMAKER INSERTION N/A 02/07/2012   Procedure: PERMANENT PACEMAKER INSERTION;  Surgeon: Evans Lance, MD;  Location: [facility] CATH LAB;  Service: Cardiovascular;  Laterality: N/A;  . POLYPECTOMY  ~2005; ~2008  . TONSILLECTOMY  1950's  . VAGINAL HYSTERECTOMY  1970     Social History:  The patient  reports that she has been smoking Cigarettes.  She has a 25.00 pack-year smoking history. She has never used smokeless tobacco. She reports that she drinks alcohol. She reports that she does not use drugs.   Family History:  The patient's family history includes Breast cancer in her mother; Cancer in her mother; Heart attack in her mother; Heart disease in her mother.    ROS:  Please see the history of present illness. All other systems are reviewed and  Negative to the above problem except as noted.    PHYSICAL EXAM: VS:  BP 134/64   Pulse 64   Ht 4\' 11"  (1.499 m)   Wt 129 lb 12.8 oz (58.9 kg)   BMI 26.22 kg/m   GEN: Well nourished, well developed, in no acute distress  HEENT: normal  Neck: no JVD, carotid bruits, or masses Cardiac: RRR; no murmurs, rubs, or gallops,no edema  Respiratory:  clear to auscultation bilaterally, normal work of breathing GI: soft, nontender, nondistended, + BS  No hepatomegaly  MS: no deformity Moving all extremities   Skin: warm and dry, no rash Neuro:  Strength and sensation are intact Psych: euthymic mood, full affect   EKG:  EKG is not ordered today.   Lipid Panel    Component Value Date/Time   CHOL 177  03/17/2013 1310   TRIG 105 03/17/2013 1310   HDL 62 03/17/2013 1310   CHOLHDL 2.9 03/17/2013 1310   VLDL 21 03/17/2013 1310   LDLCALC 94 03/17/2013 1310      Wt Readings from Last 3 Encounters:  12/20/15 129 lb 12.8 oz (58.9 kg)  11/14/15 135 lb (61.2 kg)  12/18/14 134 lb 0.6 oz (60.8 kg)      ASSESSMENT AND PLAN: 1  HTN  BP is good  COntinue meds   2  HL  Will have labs checked on Wed at rheum  Get lipids checked  3  PPM  Follow up with Beckie Salts  F/U in 1 year       Current medicines are reviewed at length with the patient today.  The patient does not have concerns regarding medicines.  Signed, Dorris Carnes, MD  12/20/2015 8:41 AM    [address], [place], [place]  [address] Phone: [phone]; Fax: [phone]

## 2015-12-20 ENCOUNTER — Ambulatory Visit (INDEPENDENT_AMBULATORY_CARE_PROVIDER_SITE_OTHER): Payer: Medicare Other | Admitting: Internal Medicine

## 2015-12-20 ENCOUNTER — Encounter (INDEPENDENT_AMBULATORY_CARE_PROVIDER_SITE_OTHER): Payer: Self-pay

## 2015-12-20 ENCOUNTER — Encounter: Payer: Self-pay | Admitting: Internal Medicine

## 2015-12-20 VITALS — BP 134/64 | HR 64 | Ht 59.0 in | Wt 129.8 lb

## 2015-12-20 DIAGNOSIS — I10 Essential (primary) hypertension: Secondary | ICD-10-CM | POA: Diagnosis not present

## 2015-12-20 DIAGNOSIS — E785 Hyperlipidemia, unspecified: Secondary | ICD-10-CM | POA: Diagnosis not present

## 2015-12-20 NOTE — Patient Instructions (Addendum)
Your physician recommends that you continue on your current medications as directed. Please refer to the Current Medication list given to you today. Please take the prescription for lab work with you to have additional labs drawn on Wednesday, results will be faxed to Dr. Harrington Challenger.    Your physician wants you to follow-up in: 1 year with Dr. Harrington Challenger.  You will receive a reminder letter in the mail two months in advance. If you don't receive a letter, please call our office to schedule the follow-up appointment.

## 2015-12-24 ENCOUNTER — Encounter: Payer: Self-pay | Admitting: Internal Medicine

## 2015-12-28 ENCOUNTER — Other Ambulatory Visit: Payer: Self-pay | Admitting: Internal Medicine

## 2015-12-28 MED ORDER — AMLODIPINE BESYLATE 2.5 MG PO TABS: 2.5000 mg | ORAL_TABLET | Freq: Two times a day (BID) | ORAL | 3 refills | Status: DC

## 2016-01-07 ENCOUNTER — Ambulatory Visit: Payer: Medicare Other | Admitting: Internal Medicine

## 2016-01-11 ENCOUNTER — Other Ambulatory Visit: Payer: Self-pay | Admitting: *Deleted

## 2016-01-11 DIAGNOSIS — E785 Hyperlipidemia, unspecified: Secondary | ICD-10-CM

## 2016-01-11 MED ORDER — ROSUVASTATIN CALCIUM 5 MG PO TABS: 5.0000 mg | ORAL_TABLET | Freq: Every day | ORAL | 11 refills | Status: DC

## 2016-03-06 ENCOUNTER — Other Ambulatory Visit: Payer: Medicare Other | Admitting: *Deleted

## 2016-03-06 DIAGNOSIS — E785 Hyperlipidemia, unspecified: Secondary | ICD-10-CM

## 2016-03-06 LAB — LIPID PANEL
CHOL/HDL RATIO: 2.7 ratio (ref 0.0–4.4)
Cholesterol, Total: 152 mg/dL (ref 100–199)
HDL: 57 mg/dL (ref >39)
LDL CALC: 82 mg/dL (ref 0–99)
Triglycerides: 63 mg/dL (ref 0–149)
VLDL Cholesterol Cal: 13 mg/dL (ref 5–40)

## 2016-03-07 ENCOUNTER — Other Ambulatory Visit: Payer: Medicare Other

## 2016-03-27 ENCOUNTER — Encounter: Payer: Self-pay | Admitting: *Deleted

## 2016-03-29 ENCOUNTER — Other Ambulatory Visit (HOSPITAL_COMMUNITY): Payer: Self-pay | Admitting: Internal Medicine

## 2016-03-29 DIAGNOSIS — M545 Low back pain: Secondary | ICD-10-CM

## 2016-04-06 ENCOUNTER — Encounter (HOSPITAL_COMMUNITY)
Admission: RE | Admit: 2016-04-06 | Discharge: 2016-04-06 | Disposition: A | Discharge status: Home / Self Care | Payer: Medicare Other | Source: Ambulatory Visit | Attending: Internal Medicine | Admitting: Internal Medicine

## 2016-04-06 DIAGNOSIS — C50919 Malignant neoplasm of unspecified site of unspecified female breast: Secondary | ICD-10-CM | POA: Insufficient documentation

## 2016-04-06 DIAGNOSIS — M549 Dorsalgia, unspecified: Secondary | ICD-10-CM | POA: Diagnosis present

## 2016-04-06 DIAGNOSIS — M545 Low back pain: Secondary | ICD-10-CM

## 2016-04-06 MED ORDER — TECHNETIUM TC 99M MEDRONATE IV KIT
21.0000 | PACK | Freq: Once | INTRAVENOUS | Status: AC | PRN
  Administered 2016-04-06: 21 via INTRAVENOUS

## 2016-04-13 ENCOUNTER — Other Ambulatory Visit: Payer: Self-pay | Admitting: Internal Medicine

## 2016-04-13 DIAGNOSIS — R0789 Other chest pain: Secondary | ICD-10-CM

## 2016-04-17 ENCOUNTER — Ambulatory Visit
Admission: RE | Admit: 2016-04-17 | Discharge: 2016-04-17 | Disposition: A | Discharge status: Home / Self Care | Payer: Medicare Other | Source: Ambulatory Visit | Attending: Internal Medicine | Admitting: Internal Medicine

## 2016-04-17 DIAGNOSIS — R0789 Other chest pain: Secondary | ICD-10-CM

## 2016-04-17 MED ORDER — IOPAMIDOL (ISOVUE-300) INJECTION 61%
75.0000 mL | Freq: Once | INTRAVENOUS | Status: AC | PRN
  Administered 2016-04-17: 75 mL via INTRAVENOUS

## 2016-07-13 ENCOUNTER — Ambulatory Visit (INDEPENDENT_AMBULATORY_CARE_PROVIDER_SITE_OTHER): Payer: Medicare Other | Admitting: Physical Medicine and Rehabilitation

## 2016-07-13 ENCOUNTER — Encounter (INDEPENDENT_AMBULATORY_CARE_PROVIDER_SITE_OTHER): Payer: Self-pay | Admitting: Physical Medicine and Rehabilitation

## 2016-07-13 VITALS — BP 139/67 | HR 62

## 2016-07-13 DIAGNOSIS — M79605 Pain in left leg: Secondary | ICD-10-CM

## 2016-07-13 DIAGNOSIS — M47816 Spondylosis without myelopathy or radiculopathy, lumbar region: Secondary | ICD-10-CM | POA: Diagnosis not present

## 2016-07-13 DIAGNOSIS — G8929 Other chronic pain: Secondary | ICD-10-CM

## 2016-07-13 DIAGNOSIS — M545 Low back pain: Secondary | ICD-10-CM

## 2016-07-13 DIAGNOSIS — R202 Paresthesia of skin: Secondary | ICD-10-CM | POA: Diagnosis not present

## 2016-07-13 NOTE — Progress Notes (Deleted)
Lower back pain and pain down front of left leg. Numbness and tingling in legs and hands. Difficulty walking can be difficult. Pain turning over in the bed and when she first gets up from sitting. Finished oral prednisone which did help.

## 2016-07-13 NOTE — Progress Notes (Signed)
Carol Bright - 83 y.o. female MRN [id number]  Date of birth: August 25, 1930  Office Visit Note: Visit Date: 07/13/2016 PCP: Leanna Battles, MD Referred by: Leanna Battles, MD  Subjective: Chief Complaint  Patient presents with  . Lower Back - Pain  . Left Lower Leg - Pain   HPI: Carol Bright is an 83 year old female kindly sent to me by Dr. Philip Aspen her primary care physician. She reports chronic worsening severe lower back pain predominantly on the left side that we'll refer down the posterior lateral leg and somewhat of an L5 and S1 distribution. She is a little bit of pain in the anterior part of the leg as well. She does get numbness and tingling in the legs and hands in a nondermatomal fashion. She reports most of her pain with standing and walking and she gets relief at rest and sitting. If she sits for a prolonged period of time that does seem to worsen. She does get some pain turning over in the bed and when she first gets up from sitting. She reports to me that she's been taking some oral prednisone did help some but not that much. She does get some relief at rest. Medications have not helped that much. She has not had recent physical therapy. She has not had lumbar surgery or lumbar spine intervention therapy. Her case is complicated by rheumatoid arthritis as well as significant heart disease and she does have a pacemaker. She cannot have an MRI. She had a recent CT scan of her abdomen and October of last year. This shows degenerative lumbar spine without significant bony stenosis. X-ray completed at 2016 shows a transitional L5 segment. She reports no use of blood thinners at this point. She also talks about not being really interested in injection but she is willing to try an injection if needed because of the severity of her symptoms. She talks about having to go to the emergency room on several occasions over the last few years with back pain. She denies any recent trauma or falls. She  denies any unexplained weight loss. No fevers chills or night sweats. She does have history of breast cancer.    Review of Systems  Constitutional: Positive for malaise/fatigue. Negative for chills, fever and weight loss.  HENT: Negative for hearing loss and sinus pain.   Eyes: Negative for blurred vision, double vision and photophobia.  Respiratory: Negative for cough and shortness of breath.   Cardiovascular: Negative for chest pain, palpitations and leg swelling.  Gastrointestinal: Negative for abdominal pain, nausea and vomiting.  Genitourinary: Negative for flank pain.  Musculoskeletal: Positive for back pain. Negative for myalgias.       Left leg pain  Skin: Negative for itching and rash.  Neurological: Positive for tingling and weakness. Negative for tremors and focal weakness.  Endo/Heme/Allergies: Negative.   Psychiatric/Behavioral: Negative for depression.  All other systems reviewed and are negative.  Otherwise per HPI.  Assessment & Plan: Visit Diagnoses:  1. Chronic bilateral low back pain without sciatica   2. Pain in left leg   3. Spondylosis without myelopathy or radiculopathy, lumbar region   4. Paresthesia of skin     Plan: Findings:  1. Chronic worsening severe at times low back pain with left radicular pain more consistent with L5 or S1 symptoms although she does talk about some anterior leg pain at times. No signs of hip problems on exam or images that we have. I do not feel the need to get  any x-rays today that she's had multiple images and CT scans and probably doesn't need the extra radiation. Unfortunately she cannot have an MRI due to pacemaker placement. I do think her symptoms sound more like a problem with central or lateral recess stenosis and left radicular pain. She has some features that could be related to disc protrusion but either way I think she does need an L5-S1 intralaminar epidural steroid injection. This would be diagnostic hopefully therapeutic.  I discussed this at length with her and she does want to proceed with that. We'll try to get that scheduled. She is taking medications consistent with nerve pain she does take some gabapentin I believe. They can be maximized to benefit of how she does. Unfortunately the next step if she just really having issues would be a CT myelogram.  2. The numbness and tingling in the hands and feet is really nondermatomal. It could be some level of peripheral polyneuropathy. The feet symptoms obviously could be related to the lumbar spine but this is not consistent with her complaints of having mostly one leg symptoms. The hands could be consistent with a carpal tunnel problem as well. Dr. Sharlett Iles may wish to have electrodiagnostic studies performed. I would suggest with her multitude of conditions and medication she has this done with a neurologist. I was not really asked to evaluate this but she did talk to me about her numbness and tingling in her hands.  I spent more than 30 minutes speaking face-to-face with the patient with 50% of the time in counseling.    Meds & Orders: No orders of the defined types were placed in this encounter.  No orders of the defined types were placed in this encounter.   Follow-up: Return for left L5-S1 interlaminar epidural .   Procedures: No procedures performed  No notes on file   Clinical History: No specialty comments available.  She reports that she has been smoking Cigarettes.  She has a 25.00 pack-year smoking history. She has never used smokeless tobacco. No results for input(s): HGBA1C, LABURIC in the last 8760 hours.  Objective:  VS:  HT:    WT:   BMI:     BP:139/67  HR:62bpm  TEMP: ( )  RESP:  Physical Exam  Constitutional: She is oriented to person, place, and time. She appears well-developed and well-nourished. No distress.  HENT:  Head: Normocephalic and atraumatic.  Nose: Nose normal.  Mouth/Throat: Oropharynx is clear and moist.  Eyes:  Conjunctivae are normal. Pupils are equal, round, and reactive to light.  Neck: Normal range of motion. Neck supple.  Increased forward flexion due to kyphosis in the thoracic spine  Cardiovascular: Normal rate and intact distal pulses.   Pulmonary/Chest: Effort normal. No respiratory distress.  Abdominal: She exhibits no distension. There is no guarding.  Musculoskeletal:  Patient ambulates with a cane. She is slow to rise from a seated position. She does have a focal trigger point in the left paraspinal musculature of the lower back. She has pain with extension of the lumbar spine. She has no real pain over the greater trochanters. She has no pain with hip rotation. She has good distal strength. She has an equivocal slump test to the left.  Lymphadenopathy:    She has no cervical adenopathy.  Neurological: She is alert and oriented to person, place, and time. She exhibits normal muscle tone. Coordination normal.  Skin: Skin is warm. No rash noted. No erythema.  Psychiatric: She has a normal mood  and affect. Her behavior is normal.  Nursing note and vitals reviewed.   Ortho Exam Imaging: No results found.  Past Medical/Family/Surgical/Social History: Medications & Allergies reviewed per EMR Patient Active Problem List   Diagnosis Date Noted  . Back pain 12/18/2014  . RUQ abdominal pain 07/23/2014  . Nausea with vomiting 07/23/2014  . CN (constipation) 07/23/2014  . Muscle pain 02/10/2014  . Dizziness 01/26/2014  . TIA (transient ischemic attack) 03/17/2013    Class: Acute  . CAD (coronary artery disease) of artery bypass graft 03/17/2013    Class: Chronic  . Dysphagia, pharyngoesophageal phase 03/17/2013    Class: Chronic  . Altered mental status 03/15/2013    Class: Acute  . Pacemaker 05/18/2012  . Sinus bradycardia 02/04/2012  . Dyspnea on exertion   . Orthostatic lightheadedness   . Palpitations   . Syncope   . Chest pain 12/27/2010  . Dyslipidemia 12/27/2010  . HTN  (hypertension) 12/27/2010  . Tobacco abuse 12/27/2010   Past Medical History:  Diagnosis Date  . Borderline diabetes   . Breast cancer ([place]) [place]  . Cataract    Bil  . Daily headache    "recently" (02/06/2012)  . Diverticulosis   . Dyspnea on exertion    "off and on for years; just worse recently" (02/06/2012)  . External hemorrhoids   . GERD (gastroesophageal reflux disease)   . Guaiac + stool 2005   "went to Dr. Henrene Pastor; took out polyps" (02/06/2012)  . H/O hiatal hernia   . History of bronchitis    "occasionally" (02/06/2012)  . Hyperlipidemia   . Hypertension   . Orthostatic lightheadedness   . Palpitations   . Rheumatoid arthritis(714.0)    PT IS FOLLOWED BY DR [place]  . Sinus bradycardia 02/04/2012  . Syncope and collapse 02/04/2012   "first time ever" (02/06/2012)   Family History  Problem Relation Age of Onset  . Cancer Mother        LEFT BREAST REMOVED  . Heart attack Mother   . Heart disease Mother   . Breast cancer Mother    Past Surgical History:  Procedure Laterality Date  . CARDIAC CATHETERIZATION  02/05/2012   "first one ever" (02/06/2012)  . COLONOSCOPY  11/2011  . CYSTECTOMY  1993   Left shoulder  . LEFT AND RIGHT HEART CATHETERIZATION WITH CORONARY ANGIOGRAM N/A 02/05/2012   Procedure: LEFT AND RIGHT HEART CATHETERIZATION WITH CORONARY ANGIOGRAM;  Surgeon: Sherren Mocha, MD;  [facility];  Service: Cardiovascular;  Laterality: N/A;  . MASTECTOMY  1995   Left  . PERMANENT PACEMAKER INSERTION N/A 02/07/2012   Procedure: PERMANENT PACEMAKER INSERTION;  Surgeon: Evans Lance, MD;  Location: [facility] CATH LAB;  Service: Cardiovascular;  Laterality: N/A;  . POLYPECTOMY  ~2005; ~2008  . TONSILLECTOMY  1950's  . VAGINAL HYSTERECTOMY  1970   Social History   Occupational History  . Not on file.   Social History Main Topics  . Smoking status: Current Every Day Smoker    Packs/day: 0.50      Years: 50.00    Types: Cigarettes  . Smokeless tobacco: Never Used     Comment: 02/06/2012 'stopped smoking 4-5 times"; offered smoking cessation materials; pt declines  . Alcohol use 0.0 oz/week     Comment: 02/06/2012 "occasional; "like a holiday"  . Drug use: No  . Sexual activity: No

## 2016-07-14 ENCOUNTER — Encounter: Payer: Self-pay | Admitting: Internal Medicine

## 2016-07-14 ENCOUNTER — Ambulatory Visit (INDEPENDENT_AMBULATORY_CARE_PROVIDER_SITE_OTHER): Payer: Medicare Other | Admitting: *Deleted

## 2016-07-14 ENCOUNTER — Ambulatory Visit (INDEPENDENT_AMBULATORY_CARE_PROVIDER_SITE_OTHER): Payer: Medicare Other | Admitting: Internal Medicine

## 2016-07-14 VITALS — BP 168/70 | HR 66 | Ht 59.0 in | Wt 132.1 lb

## 2016-07-14 DIAGNOSIS — R42 Dizziness and giddiness: Secondary | ICD-10-CM | POA: Diagnosis not present

## 2016-07-14 DIAGNOSIS — I10 Essential (primary) hypertension: Secondary | ICD-10-CM | POA: Diagnosis not present

## 2016-07-14 DIAGNOSIS — R001 Bradycardia, unspecified: Secondary | ICD-10-CM | POA: Diagnosis not present

## 2016-07-14 DIAGNOSIS — E785 Hyperlipidemia, unspecified: Secondary | ICD-10-CM | POA: Diagnosis not present

## 2016-07-14 MED ORDER — AMLODIPINE BESYLATE 5 MG PO TABS: 5.0000 mg | ORAL_TABLET | Freq: Two times a day (BID) | ORAL | 3 refills | Status: DC

## 2016-07-14 NOTE — Progress Notes (Signed)
Cardiology Office Note   Date:  07/14/2016   ID:  Carol Bright, DOB Apr 12, 1930, MRN [id number]  PCP:  Leanna Battles, MD  Cardiologist:   Dorris Carnes, MD   Pt presents for f/u of HTN  Also complains of dizziness   History of Present Illness: Carol Bright is a 83 y.o. female with a history of chest pain, HTN, RA  She is s/p PPM for bradycardia  I saw her in November 2017    Pt returns today complaining of dizziness  No syncope Seen at [facility]  BP elevated   Seen by Dr Sharlett Iles  Placed on toprol XL 25    Still is dizzy Breathing is stable  Takes time   Denies CP         Current Meds  Medication Sig  . acetaminophen (TYLENOL) 500 MG tablet Take 500 mg by mouth every 6 (six) hours as needed for moderate pain (pain).  Marland Kitchen alendronate (FOSAMAX) 70 MG tablet Take 70 mg by mouth once a week. On Wednesday. Take with a full glass of water on an empty stomach.  Marland Kitchen amLODipine (NORVASC) 2.5 MG tablet Take 1 tablet (2.5 mg total) by mouth 2 (two) times daily.  Marland Kitchen aspirin 81 MG tablet Take 81 mg by mouth at bedtime.  . ATENOLOL PO Take by mouth daily.  . Calcium Citrate (CITRACAL PO) Take 1 tablet by mouth daily.   . cetirizine (ZYRTEC) 10 MG tablet Take 10 mg by mouth daily as needed for allergies (allergies).   . famotidine (PEPCID) 10 MG tablet Take 10 mg by mouth 2 (two) times daily.  . folic acid (FOLVITE) 1 MG tablet Take 2 mg by mouth daily.  Marland Kitchen gabapentin (NEURONTIN) 300 MG capsule Take 1 capsule by mouth 3 (three) times daily.  Marland Kitchen HYDROcodone-acetaminophen (NORCO/VICODIN) 5-325 MG tablet Take 1 tablet by mouth as directed.   Marland Kitchen levofloxacin (LEVAQUIN) 500 MG tablet Take 1 tablet (500 mg total) by mouth daily.  . magnesium hydroxide (MILK OF MAGNESIA) 400 MG/5ML suspension Take 30 mLs by mouth daily as needed for mild constipation (constipation).  . meclizine (ANTIVERT) 12.5 MG tablet Take 1 tablet (12.5 mg total) by mouth 2 (two) times daily as needed for dizziness.  .  Menthol-Methyl Salicylate (MUSCLE RUB) 10-15 % CREA Apply 1 application topically daily as needed for muscle pain (muscle pain).  . methocarbamol (ROBAXIN) 500 MG tablet Take 1 tablet (500 mg total) by mouth every 8 (eight) hours as needed for muscle spasms.  . methotrexate (RHEUMATREX) 2.5 MG tablet Take 25 mg by mouth once a week. Takes 10 tablets on Sunday. Caution:Chemotherapy. Protect from light.  . polyethylene glycol (MIRALAX / GLYCOLAX) packet Take 17 g by mouth every other day.  . predniSONE (DELTASONE) 1 MG tablet Take 3 mg by mouth daily with breakfast.  . rosuvastatin (CRESTOR) 5 MG tablet Take 1 tablet (5 mg total) by mouth daily.  . timolol (TIMOPTIC) 0.5 % ophthalmic solution Place 1 drop into both eyes 2 (two) times daily.   . Vitamin D, Ergocalciferol, (DRISDOL) 50000 units CAPS capsule Take 50,000 Units by mouth every 7 (seven) days.     Allergies:   Morphine; Morphine and related; Penicillins; and Penicillin g   Past Medical History:  Diagnosis Date  . Borderline diabetes   . Breast cancer ([place]) [place]  . Cataract    Bil  . Daily headache    "recently" (  02/06/2012)  . Diverticulosis   . Dyspnea on exertion    "off and on for years; just worse recently" (02/06/2012)  . External hemorrhoids   . GERD (gastroesophageal reflux disease)   . Guaiac + stool 2005   "went to Dr. Henrene Pastor; took out polyps" (02/06/2012)  . H/O hiatal hernia   . History of bronchitis    "occasionally" (02/06/2012)  . Hyperlipidemia   . Hypertension   . Orthostatic lightheadedness   . Palpitations   . Rheumatoid arthritis(714.0)    PT IS FOLLOWED BY DR [place]  . Sinus bradycardia 02/04/2012  . Syncope and collapse 02/04/2012   "first time ever" (02/06/2012)    Past Surgical History:  Procedure Laterality Date  . CARDIAC CATHETERIZATION  02/05/2012   "first one ever" (02/06/2012)  . COLONOSCOPY  11/2011  . CYSTECTOMY  1993   Left  shoulder  . LEFT AND RIGHT HEART CATHETERIZATION WITH CORONARY ANGIOGRAM N/A 02/05/2012   Procedure: LEFT AND RIGHT HEART CATHETERIZATION WITH CORONARY ANGIOGRAM;  Surgeon: Sherren Mocha, MD;  Location: [facility] CATH LAB;  Service: Cardiovascular;  Laterality: N/A;  . MASTECTOMY  1995   Left  . PERMANENT PACEMAKER INSERTION N/A 02/07/2012   Procedure: PERMANENT PACEMAKER INSERTION;  Surgeon: Evans Lance, MD;  Location: [facility] CATH LAB;  Service: Cardiovascular;  Laterality: N/A;  . POLYPECTOMY  ~2005; ~2008  . TONSILLECTOMY  1950's  . VAGINAL HYSTERECTOMY  1970     Social History:  The patient  reports that she has been smoking Cigarettes.  She has a 25.00 pack-year smoking history. She has never used smokeless tobacco. She reports that she drinks alcohol. She reports that she does not use drugs.   Family History:  The patient's family history includes Breast cancer in her mother; Cancer in her mother; Heart attack in her mother; Heart disease in her mother.    ROS:  Please see the history of present illness. All other systems are reviewed and  Negative to the above problem except as noted.    PHYSICAL EXAM: VS:  BP (!) 168/70   Pulse 66   Ht 4\' 11"  (1.499 m)   Wt 59.9 kg (132 lb 1.9 oz)   SpO2 97%   BMI 26.69 kg/m   GEN: Well nourished, well developed, in no acute distress  HEENT: normal  Neck: no JVD, carotid bruits, or masses Cardiac: RRR; no murmurs, rubs, or gallops,no edema  Respiratory:  clear to auscultation bilaterally, normal work of breathing GI: soft, nontender, nondistended, + BS  No hepatomegaly  MS: no deformity Moving all extremities   Skin: warm and dry, no rash Neuro:  Strength and sensation are intact Psych: euthymic mood, full affect   EKG:  EKG is not ordered today.   Lipid Panel    Component Value Date/Time   CHOL 152 03/06/2016 0741   TRIG 63 03/06/2016 0741   HDL 57 03/06/2016 0741   CHOLHDL 2.7 03/06/2016 0741   CHOLHDL 2.9 03/17/2013 1310   VLDL  21 03/17/2013 1310   LDLCALC 82 03/06/2016 0741      Wt Readings from Last 3 Encounters:  07/14/16 59.9 kg (132 lb 1.9 oz)  12/20/15 58.9 kg (129 lb 12.8 oz)  11/14/15 61.2 kg (135 lb)      ASSESSMENT AND PLAN:  1  Dizziness BP is elevated today  It may explain dizziness  I would recomm increaing amlodipine to 5 bid   Continue metoprolol WIll need tof follow response  2  HTN  As above  Follow symptoms    2  HL  LDL 82  HDL 57   Keep on Crestor  3  S/p PPM  Pacer interrogated  Normal function  Only very short bursts of occasional atrial tachycardia   Current medicines are reviewed at length with the patient today.  The patient does not have concerns regarding medicines.  Signed, Dorris Carnes, MD  07/14/2016 4:50 PM    [facility] [address], [place], [place]  [address] Phone: [phone]; Fax: [phone]

## 2016-07-14 NOTE — Patient Instructions (Addendum)
Your physician has recommended you make the following change in your medication:  1.) increase amlodipine to 5 mg two times a day.    Continue to monitor your blood pressure at home and keep Dr. Harrington Challenger informed.

## 2016-07-18 LAB — CUP PACEART INCLINIC DEVICE CHECK
Date Time Interrogation Session: 20180622040000
Implantable Lead Location: 753859
Implantable Lead Location: 753860
Implantable Lead Model: 4135
Implantable Lead Serial Number: 29326965
Implantable Pulse Generator Implant Date: 20140115
Lead Channel Impedance Value: 415 Ohm
Lead Channel Impedance Value: 641 Ohm
Lead Channel Pacing Threshold Amplitude: 1 V
Lead Channel Pacing Threshold Pulse Width: 0.4 ms
Lead Channel Setting Pacing Amplitude: 2 V
MDC IDC LEAD IMPLANT DT: 20140115
MDC IDC LEAD IMPLANT DT: 20140115
MDC IDC LEAD SERIAL: 29298114
MDC IDC MSMT LEADCHNL RA PACING THRESHOLD AMPLITUDE: 0.8 V
MDC IDC MSMT LEADCHNL RA PACING THRESHOLD PULSEWIDTH: 0.4 ms
MDC IDC MSMT LEADCHNL RA SENSING INTR AMPL: 4.5 mV
MDC IDC MSMT LEADCHNL RV SENSING INTR AMPL: 20.2 mV
MDC IDC SET LEADCHNL RV PACING AMPLITUDE: 2.4 V
MDC IDC SET LEADCHNL RV PACING PULSEWIDTH: 0.4 ms
MDC IDC SET LEADCHNL RV SENSING SENSITIVITY: 2.5 mV
Pulse Gen Serial Number: 111372

## 2016-07-18 NOTE — Progress Notes (Signed)
Pacemaker check in clinic w/Dr.Ross. Normal device function. Thresholds, sensing, impedances consistent with previous measurements. Device programmed to maximize longevity. (10) ATR episodes, all <53min (0.3 total mins)--AT per EGMS. (1) VT-NS episode, x 4bts. Device programmed at appropriate safety margins. Histogram distribution appropriate for patient activity level. Device programmed to optimize intrinsic conduction. Estimated longevity 10 years. Patient will follow up via [facility] on 9/24 and with GT in 12 months.

## 2016-08-01 ENCOUNTER — Ambulatory Visit (INDEPENDENT_AMBULATORY_CARE_PROVIDER_SITE_OTHER): Payer: Medicare Other

## 2016-08-01 ENCOUNTER — Ambulatory Visit (INDEPENDENT_AMBULATORY_CARE_PROVIDER_SITE_OTHER): Payer: Medicare Other | Admitting: Physical Medicine and Rehabilitation

## 2016-08-01 ENCOUNTER — Encounter (INDEPENDENT_AMBULATORY_CARE_PROVIDER_SITE_OTHER): Payer: Self-pay | Admitting: Physical Medicine and Rehabilitation

## 2016-08-01 VITALS — BP 148/75 | HR 64

## 2016-08-01 DIAGNOSIS — M5416 Radiculopathy, lumbar region: Secondary | ICD-10-CM | POA: Diagnosis not present

## 2016-08-01 MED ORDER — LIDOCAINE HCL (PF) 1 % IJ SOLN
2.0000 mL | Freq: Once | INTRAMUSCULAR | Status: AC
  Administered 2016-08-01: 2 mL

## 2016-08-01 MED ORDER — METHYLPREDNISOLONE ACETATE 80 MG/ML IJ SUSP
80.0000 mg | Freq: Once | INTRAMUSCULAR | Status: AC
  Administered 2016-08-01: 80 mg

## 2016-08-01 NOTE — Procedures (Signed)
Lumbar Epidural Steroid Injection - Interlaminar Approach with Fluoroscopic Guidance  Patient: Carol Bright      Date of Birth: [birth year] MRN: [id number] PCP: Leanna Battles, MD      Visit Date: 08/01/2016   Carol Bright is an 83 year old female who recently saw for evaluation and management of her low back and left hip and leg pain. She is here today for planned left L5-S1 intralaminar epidural steroid injection. Please see our prior evaluation and management note for further details and justification.  Universal Protocol:    Date/Time: 07/10/182:24 PM  Consent Given By: the patient  Position: PRONE  Additional Comments: Vital signs were monitored before and after the procedure. Patient was prepped and draped in the usual sterile fashion. The correct patient, procedure, and site was verified.   Injection Procedure Details:  Procedure Site One Meds Administered:  Meds ordered this encounter  Medications  . lidocaine (PF) (XYLOCAINE) 1 % injection 2 mL  . methylPREDNISolone acetate (DEPO-MEDROL) injection 80 mg     Laterality: Left  Location/Site:  L5-S1  Needle size: 20 G  Needle type: Tuohy  Needle Placement: Paramedian epidural  Findings:  -Contrast Used: 1 mL iohexol 180 mg iodine/mL   -Comments: Excellent flow of contrast into the epidural space.  Procedure Details: Using a paramedian approach from the side mentioned above, the region overlying the inferior lamina was localized under fluoroscopic visualization and the soft tissues overlying this structure were infiltrated with 4 ml. of 1% Lidocaine without Epinephrine. The Tuohy needle was inserted into the epidural space using a paramedian approach.   The epidural space was localized using loss of resistance along with lateral and bi-planar fluoroscopic views.  After negative aspirate for air, blood, and CSF, a 2 ml. volume of Isovue-250 was injected into the epidural space and the flow of contrast was observed.  Radiographs were obtained for documentation purposes.    The injectate was administered into the level noted above.   Additional Comments:  The patient tolerated the procedure well Dressing: Band-Aid    Post-procedure details: Patient was observed during the procedure. Post-procedure instructions were reviewed.  Patient left the clinic in stable condition.

## 2016-08-01 NOTE — Patient Instructions (Signed)

## 2016-08-01 NOTE — Progress Notes (Signed)
Patient is here today for planned left L5-S1 interlaminar injection. No change in symptoms.

## 2016-12-25 ENCOUNTER — Telehealth (INDEPENDENT_AMBULATORY_CARE_PROVIDER_SITE_OTHER): Payer: Self-pay | Admitting: Physical Medicine and Rehabilitation

## 2016-12-25 NOTE — Telephone Encounter (Signed)
Yes but anything out of that then OV

## 2016-12-26 ENCOUNTER — Other Ambulatory Visit (INDEPENDENT_AMBULATORY_CARE_PROVIDER_SITE_OTHER): Payer: Self-pay | Admitting: Physical Medicine and Rehabilitation

## 2016-12-26 MED ORDER — OXAPROZIN 600 MG PO TABS: 600.0000 mg | ORAL_TABLET | Freq: Every day | ORAL | 0 refills | Status: DC

## 2016-12-26 NOTE — Telephone Encounter (Signed)
Patient said she is hurting all over, but she has pain in her back too. I asked her where most of her pain is, and she said lower back and upper abdomen. I have scheduled her for our next available OV; she wants to know if you can prescribe something for the pain until then. RA [place].

## 2016-12-26 NOTE — Telephone Encounter (Signed)
Sent in anti-inflammatory,please inform her to only take until we see her, do not want her to take more than three weeks.

## 2016-12-26 NOTE — Telephone Encounter (Signed)
Called patient to advise  °

## 2016-12-26 NOTE — Progress Notes (Signed)
Oxaprazosin, anti-inflammatory just until we see her in OV

## 2016-12-27 ENCOUNTER — Telehealth (INDEPENDENT_AMBULATORY_CARE_PROVIDER_SITE_OTHER): Payer: Self-pay | Admitting: Physical Medicine and Rehabilitation

## 2016-12-27 ENCOUNTER — Other Ambulatory Visit (INDEPENDENT_AMBULATORY_CARE_PROVIDER_SITE_OTHER): Payer: Self-pay | Admitting: Physical Medicine and Rehabilitation

## 2016-12-27 MED ORDER — MELOXICAM 15 MG PO TABS: 15.0000 mg | ORAL_TABLET | Freq: Every day | ORAL | 0 refills | Status: DC

## 2016-12-27 NOTE — Progress Notes (Signed)
meloxicam instead of daypro

## 2016-12-27 NOTE — Telephone Encounter (Signed)
Prescription for Oxaprozin 600 MG Tablet not covered. Please advise.

## 2016-12-27 NOTE — Telephone Encounter (Signed)
Patient called crying saying that her RX that was written for her back pain is needing a prior authorization before it can be filled, she is currently in severe pain. CB # [phone]

## 2016-12-27 NOTE — Telephone Encounter (Signed)
Discontinued daypro gave rx for meloxicam If pain so severe then ED

## 2016-12-27 NOTE — Telephone Encounter (Signed)
Left message to advise

## 2017-01-09 ENCOUNTER — Ambulatory Visit (INDEPENDENT_AMBULATORY_CARE_PROVIDER_SITE_OTHER): Payer: Medicare Other | Admitting: Physical Medicine and Rehabilitation

## 2017-01-09 ENCOUNTER — Encounter (INDEPENDENT_AMBULATORY_CARE_PROVIDER_SITE_OTHER): Payer: Self-pay | Admitting: Physical Medicine and Rehabilitation

## 2017-01-09 VITALS — BP 143/70 | HR 60

## 2017-01-09 DIAGNOSIS — G8929 Other chronic pain: Secondary | ICD-10-CM | POA: Diagnosis not present

## 2017-01-09 DIAGNOSIS — M545 Low back pain: Secondary | ICD-10-CM

## 2017-01-09 MED ORDER — MELOXICAM 15 MG PO TABS: 15.0000 mg | ORAL_TABLET | Freq: Every day | ORAL | 0 refills | Status: DC

## 2017-01-09 NOTE — Progress Notes (Deleted)
Pain all over, also in lower back. Describes "all over" as in her sides. No leg pain. Left leg gets numb from"poor circulation." Injection in July helped, but this pain is a little different. Has to sleep in a recliner.

## 2017-01-12 ENCOUNTER — Ambulatory Visit (INDEPENDENT_AMBULATORY_CARE_PROVIDER_SITE_OTHER): Payer: Medicare Other | Admitting: Physical Medicine and Rehabilitation

## 2017-01-25 ENCOUNTER — Encounter (INDEPENDENT_AMBULATORY_CARE_PROVIDER_SITE_OTHER): Payer: Self-pay | Admitting: Physical Medicine and Rehabilitation

## 2017-01-25 ENCOUNTER — Ambulatory Visit (INDEPENDENT_AMBULATORY_CARE_PROVIDER_SITE_OTHER): Payer: Self-pay

## 2017-01-25 ENCOUNTER — Ambulatory Visit (INDEPENDENT_AMBULATORY_CARE_PROVIDER_SITE_OTHER): Payer: Medicare Other | Admitting: Physical Medicine and Rehabilitation

## 2017-01-25 VITALS — BP 144/72 | HR 63 | Temp 98.3°F

## 2017-01-25 DIAGNOSIS — M545 Low back pain, unspecified: Secondary | ICD-10-CM

## 2017-01-25 DIAGNOSIS — M47816 Spondylosis without myelopathy or radiculopathy, lumbar region: Secondary | ICD-10-CM

## 2017-01-25 DIAGNOSIS — G8929 Other chronic pain: Secondary | ICD-10-CM | POA: Diagnosis not present

## 2017-01-25 MED ORDER — BUPIVACAINE HCL 0.5 % IJ SOLN
3.0000 mL | Freq: Once | INTRAMUSCULAR | Status: AC
  Administered 2017-01-25: 3 mL

## 2017-01-25 MED ORDER — METHYLPREDNISOLONE ACETATE 80 MG/ML IJ SUSP
80.0000 mg | Freq: Once | INTRAMUSCULAR | Status: AC
  Administered 2017-01-25: 80 mg

## 2017-01-25 NOTE — Patient Instructions (Signed)

## 2017-01-25 NOTE — Progress Notes (Deleted)
Pt stated right ankle started swelling on Christmas. Pt stated pain in lower back that started on 01/23/17 that radiates to left ankle with some numbness. +Driver, -Dye Allergy, -BT.

## 2017-02-01 ENCOUNTER — Other Ambulatory Visit: Payer: Self-pay | Admitting: Internal Medicine

## 2017-02-01 DIAGNOSIS — R1011 Right upper quadrant pain: Secondary | ICD-10-CM

## 2017-02-07 ENCOUNTER — Encounter (INDEPENDENT_AMBULATORY_CARE_PROVIDER_SITE_OTHER): Payer: Self-pay | Admitting: Physical Medicine and Rehabilitation

## 2017-02-07 NOTE — Progress Notes (Signed)
Carol Bright - 84 y.o. female MRN [id number]  Date of birth: 11/01/1930  Office Visit Note: Visit Date: 01/09/2017 PCP: Leanna Battles, MD Referred by: Leanna Battles, MD  Subjective: Chief Complaint  Patient presents with  . Lower Back - Pain  . Left Leg - Numbness   HPI: Carol Bright is a very pleasant 84 year old female present with her daughter I believe.  We have seen her on one other occasion for left hip epidural injection at L5-S1 for radicular type leg pain.  This was back in July.  She comes in today with reports of "pain all over ".  When you ask her about this at length though is really low back pain radiating from the middle to the sides bilaterally left equal to right.  She says it hurts most all the time.  She has been taking an anti-inflammatory.  She had called in for started with Daypro and then switched to meloxicam which she has tolerated better.  Does not represent a great candidate for chronic escalating opioids.  But nonetheless she comes in today stating that the injection we performed in July did give her quite a bit of relief particularly with the left leg.  She reports her left leg still gets numb but she feels like this is "poor circulation".  He feels like the pain in her back at this point is different.  She has had no falls or trauma.  He has had no real down the legs other than this numb feeling on the left.  She has not had any bowel or bladder changes or habits or unexplained weight loss.  She has had no focal night pain follow-up with her primary care physician Dr. Philip Aspen.  By way of review she cannot have an MRI of the spine due to pacemaker.  She has had multiple CT scans over the last several years and she has had a fairly recent x-ray of the lumbar spine.  These show mainly spondylosis and arthritis of the lower spine.  CT scan of the abdomen which was fairly recent was reviewed again and there is really no sign of bony stenosis really hard to see any  changes of the disc of the CT scan at least to my eyes.  She had an older CT scan of the thoracic spine with increased kyphosis and potential paracentral disc protrusion but this was a couple of years ago.  Her pain is clearly lumbosacral.  If he gets worsening pain with standing and extension and better at rest.  She finds mild relief with medication and sitting.    Review of Systems  Constitutional: Positive for malaise/fatigue. Negative for chills, fever and weight loss.  HENT: Negative for hearing loss and sinus pain.   Eyes: Negative for blurred vision, double vision and photophobia.  Respiratory: Negative for cough and shortness of breath.   Cardiovascular: Negative for chest pain, palpitations and leg swelling.  Gastrointestinal: Negative for abdominal pain, nausea and vomiting.  Genitourinary: Negative for flank pain.  Musculoskeletal: Positive for back pain. Negative for myalgias.  Skin: Negative for itching and rash.  Neurological: Positive for tingling. Negative for tremors, focal weakness and weakness.  Endo/Heme/Allergies: Negative.   Psychiatric/Behavioral: Negative for depression.  All other systems reviewed and are negative.  Otherwise per HPI.  Assessment & Plan: Visit Diagnoses:  1. Chronic bilateral low back pain without sciatica     Plan: Findings:  Chronic history of spine pain both thoracic and lumbar with CT findings  of multilevel spondylosis and arthritis in general.  Left hip and leg pain that seems radicular with paresthesia even though she feels like his circulation.  Epidural injection did seem to help the full aspect of this but does not help the tingling.  Last time we saw her she was complaining a lot of tingling and numbness in the feet has some level of peripheral polyneuropathy.  As her axial back pain worse with standing and I do think this is related to the facet joints.  He does have a transitional segment 5.  Given those issues we talked at length and I  think it would be wise to complete bilateral facet joint blocks at L4-5 and L5-S1.  L3-4 and L4-5 giving the numbering scheme with an L5 transitional segment.  She could be a candidate for radiofrequency ablation although I think it might be hard to diagnostically do medial branch blocks with pain diary is with her but it would be worth a chance if it helped her.  Worth regrouping with a physical therapist if possible at some point she does continue to stay active when she can.  I think current use of oral anti-inflammatory is appropriate but we would not do that long-term.  She could follow that with Dr. Philip Aspen as well.    Meds & Orders:  Meds ordered this encounter  Medications  . meloxicam (MOBIC) 15 MG tablet    Sig: Take 1 tablet (15 mg total) by mouth daily. Take with food    Dispense:  30 tablet    Refill:  0   No orders of the defined types were placed in this encounter.   Follow-up: Return for Bilateral L4-5 and L5-S1 facet joint blocks..   Procedures: No procedures performed  No notes on file   Clinical History: No specialty comments available.  She reports that she has been smoking cigarettes.  She has a 25.00 pack-year smoking history. she has never used smokeless tobacco. No results for input(s): HGBA1C, LABURIC in the last 8760 hours.  Objective:  VS:  HT:    WT:   BMI:     BP:(!) 143/70  HR:60bpm  TEMP: ( )  RESP:  Physical Exam  Constitutional: She is oriented to person, place, and time. She appears well-developed and well-nourished.  Eyes: Conjunctivae and EOM are normal. Pupils are equal, round, and reactive to light.  Neck: Neck supple. No tracheal deviation present.  Sits with forward flexed cervical spine and does have limited rotation with pain at end ranges.  Cardiovascular: Normal rate and intact distal pulses.  Pulmonary/Chest: Effort normal.  Musculoskeletal:  Patient has pretty great difficulty arising from a seated position and does have pain with  extension.  No focal trigger points or tender points noted across the paraspinal region or quadratus lumborum.  No pain over the PSIS or greater trochanters.  No pain with hip rotation.  She has good distal strength without clonus.  Neurological: She is alert and oriented to person, place, and time. She exhibits normal muscle tone. Coordination normal.  Skin: Skin is warm and dry. No rash noted. No erythema.  Psychiatric: She has a normal mood and affect. Her behavior is normal.  Nursing note and vitals reviewed.   Ortho Exam Imaging: No results found.  Past Medical/Family/Surgical/Social History: Medications & Allergies reviewed per EMR Patient Active Problem List   Diagnosis Date Noted  . Back pain 12/18/2014  . RUQ abdominal pain 07/23/2014  . Nausea with vomiting 07/23/2014  .  CN (constipation) 07/23/2014  . Muscle pain 02/10/2014  . Dizziness 01/26/2014  . TIA (transient ischemic attack) 03/17/2013    Class: Acute  . CAD (coronary artery disease) of artery bypass graft 03/17/2013    Class: Chronic  . Dysphagia, pharyngoesophageal phase 03/17/2013    Class: Chronic  . Altered mental status 03/15/2013    Class: Acute  . Pacemaker 05/18/2012  . Sinus bradycardia 02/04/2012  . Dyspnea on exertion   . Orthostatic lightheadedness   . Palpitations   . Syncope   . Chest pain 12/27/2010  . Dyslipidemia 12/27/2010  . HTN (hypertension) 12/27/2010  . Tobacco abuse 12/27/2010   Past Medical History:  Diagnosis Date  . Borderline diabetes   . Breast cancer ([place]) [place]  . Cataract    Bil  . Daily headache    "recently" (02/06/2012)  . Diverticulosis   . Dyspnea on exertion    "off and on for years; just worse recently" (02/06/2012)  . External hemorrhoids   . GERD (gastroesophageal reflux disease)   . Guaiac + stool 2005   "went to Dr. Henrene Pastor; took out polyps" (02/06/2012)  . H/O hiatal hernia   . History of bronchitis     "occasionally" (02/06/2012)  . Hyperlipidemia   . Hypertension   . Orthostatic lightheadedness   . Palpitations   . Rheumatoid arthritis(714.0)    PT IS FOLLOWED BY DR [place]  . Sinus bradycardia 02/04/2012  . Syncope and collapse 02/04/2012   "first time ever" (02/06/2012)   Family History  Problem Relation Age of Onset  . Cancer Mother        LEFT BREAST REMOVED  . Heart attack Mother   . Heart disease Mother   . Breast cancer Mother    Past Surgical History:  Procedure Laterality Date  . CARDIAC CATHETERIZATION  02/05/2012   "first one ever" (02/06/2012)  . COLONOSCOPY  11/2011  . CYSTECTOMY  1993   Left shoulder  . LEFT AND RIGHT HEART CATHETERIZATION WITH CORONARY ANGIOGRAM N/A 02/05/2012   Procedure: LEFT AND RIGHT HEART CATHETERIZATION WITH CORONARY ANGIOGRAM;  Surgeon: Sherren Mocha, MD;  Location[facility] C[facility] LAB;  Service: Cardiovascular;  Laterality: N/A;  . MASTECTOMY  1995   Left  . PERMANENT PACEMAKER INSERTION N/A 02/07/2012   Procedure: PERMANENT PACEMAKER INSERTION;  Surgeon: Evans Lance, MD;  Location: [facility] CATH LAB;  Service: Cardiovascular;  Laterality: N/A;  . POLYPECTOMY  ~2005; ~2008  . TONSILLECTOMY  1950's  . VAGINAL HYSTERECTOMY  1970   Social History   Occupational History  . Not on file  Tobacco Use  . Smoking status: Current Every Day Smoker    Packs/day: 0.50    Years: 50.00    Pack years: 25.00    Types: Cigarettes  . Smokeless tobacco: Never Used  . Tobacco comment: 02/06/2012 'stopped smoking 4-5 times"; offered smoking cessation materials; pt declines  Substance and Sexual Activity  . Alcohol use: Yes    Alcohol/week: 0.0 oz    Comment: 02/06/2012 "occasional; "like a holiday"  . Drug use: No  . Sexual activity: No

## 2017-02-07 NOTE — Procedures (Signed)
[name] returns today for planned bilateral L3-4 and L4-5 facet joint blocks given the numbering scheme of a transitional L5 segment.  She is a poor historian say today that her pain started in the lower back first 2019 and really been ongoing the last time we saw her for evaluation and management she also reports some swelling in the right ankle that started around Christmas.  I have asked her to follow-up with Dr. Sharlett Iles.  The swelling does not appear to be walking as much due to the pain.  Please see our prior evaluation and management note for further details and justification.  Lumbar Facet Joint Intra-Articular Injection(s) with Fluoroscopic Guidance  Patient: Carol Bright      Date of Birth: October 01, 1930 MRN: [id number] PCP: Leanna Battles, MD      Visit Date: 01/25/2017   Universal Protocol:    Date/Time: 01/25/2017  Consent Given By: the patient  Position: PRONE   Additional Comments: Vital signs were monitored before and after the procedure. Patient was prepped and draped in the usual sterile fashion. The correct patient, procedure, and site was verified.   Injection Procedure Details:  Procedure Site One Meds Administered:  Meds ordered this encounter  Medications  . bupivacaine (MARCAINE) 0.5 % (with pres) injection 3 mL  . methylPREDNISolone acetate (DEPO-MEDROL) injection 80 mg     Laterality: Bilateral  Location/Site:  L3-L4 L4-L5  Needle size: 22 guage  Needle type: Spinal  Needle Placement: Articular  Findings:  -Comments: Excellent flow of contrast producing a partial arthrogram.  Procedure Details: The fluoroscope beam is vertically oriented in AP, and the inferior recess is visualized beneath the lower pole of the inferior apophyseal process, which represents the target point for needle insertion. When direct visualization is difficult the target point is located at the medial projection of the vertebral pedicle. The region overlying each  aforementioned target is locally anesthetized with a 1 to 2 ml. volume of 1% Lidocaine without Epinephrine.   The spinal needle was inserted into each of the above mentioned facet joints using biplanar fluoroscopic guidance. A 0.25 to 0.5 ml. volume of Isovue-250 was injected and a partial facet joint arthrogram was obtained. A single spot film was obtained of the resulting arthrogram.    One to 1.25 ml of the steroid/anesthetic solution was then injected into each of the facet joints noted above.   Additional Comments:  The patient tolerated the procedure well Dressing: Band-Aid    Post-procedure details: Patient was observed during the procedure. Post-procedure instructions were reviewed.  Patient left the clinic in stable condition.  Pertinent Imaging: No specialty comments available.

## 2017-02-14 ENCOUNTER — Ambulatory Visit
Admission: RE | Admit: 2017-02-14 | Discharge: 2017-02-14 | Disposition: A | Discharge status: Home / Self Care | Payer: Medicare Other | Source: Ambulatory Visit | Attending: Internal Medicine | Admitting: Internal Medicine

## 2017-02-14 DIAGNOSIS — R1011 Right upper quadrant pain: Secondary | ICD-10-CM

## 2017-02-19 ENCOUNTER — Ambulatory Visit (INDEPENDENT_AMBULATORY_CARE_PROVIDER_SITE_OTHER): Payer: Medicare Other | Admitting: Internal Medicine

## 2017-02-19 ENCOUNTER — Encounter: Payer: Self-pay | Admitting: Internal Medicine

## 2017-02-19 VITALS — BP 142/80 | HR 63 | Ht 59.0 in | Wt 131.1 lb

## 2017-02-19 DIAGNOSIS — E785 Hyperlipidemia, unspecified: Secondary | ICD-10-CM | POA: Diagnosis not present

## 2017-02-19 DIAGNOSIS — I10 Essential (primary) hypertension: Secondary | ICD-10-CM

## 2017-02-19 DIAGNOSIS — R42 Dizziness and giddiness: Secondary | ICD-10-CM

## 2017-02-19 DIAGNOSIS — Z950 Presence of cardiac pacemaker: Secondary | ICD-10-CM | POA: Diagnosis not present

## 2017-02-19 MED ORDER — ROSUVASTATIN CALCIUM 5 MG PO TABS: 5.0000 mg | ORAL_TABLET | Freq: Every day | ORAL | 3 refills | Status: DC

## 2017-02-19 NOTE — Progress Notes (Signed)
Cardiology Office Note   Date:  02/19/2017   ID:  Carol Bright, DOB September 27, 1930, MRN [id number]  PCP:  Leanna Battles, MD  Cardiologist:   Dorris Carnes, MD   Pt presents for f/u of HTN     History of Present Illness: Carol Bright is a 84 y.o. female with a history of chest pain, HTN, RA  She is s/p PPM for bradycardia  I saw her in June 2018    Pt getting shots in back    Also being evaluted for abdominal pain  Pain bad  Follows with Dr Sharlett Iles  Breathing is OK when not having other pains  Yesterday had L side pain chest to abdomen  She said pain will go from one side to the other   Current Meds  Medication Sig  . acetaminophen (TYLENOL) 500 MG tablet Take 500 mg by mouth every 6 (six) hours as needed for moderate pain (pain).  Marland Kitchen alendronate (FOSAMAX) 70 MG tablet Take 70 mg by mouth once a week. On Wednesday. Take with a full glass of water on an empty stomach.  Marland Kitchen amLODipine (NORVASC) 5 MG tablet Take 1 tablet (5 mg total) by mouth 2 (two) times daily.  Marland Kitchen aspirin 81 MG tablet Take 81 mg by mouth at bedtime.  . Calcium Citrate (CITRACAL PO) Take 1 tablet by mouth daily.   . cetirizine (ZYRTEC) 10 MG tablet Take 10 mg by mouth daily as needed for allergies (allergies).   . famotidine (PEPCID) 10 MG tablet Take 10 mg by mouth 2 (two) times daily.  . folic acid (FOLVITE) 1 MG tablet Take 2 mg by mouth daily.  Marland Kitchen gabapentin (NEURONTIN) 300 MG capsule Take 1 capsule by mouth 3 (three) times daily.  Marland Kitchen HYDROcodone-acetaminophen (NORCO/VICODIN) 5-325 MG tablet Take 1 tablet by mouth as directed.   . magnesium hydroxide (MILK OF MAGNESIA) 400 MG/5ML suspension Take 30 mLs by mouth daily as needed for mild constipation (constipation).  . meclizine (ANTIVERT) 12.5 MG tablet Take 1 tablet (12.5 mg total) by mouth 2 (two) times daily as needed for dizziness.  . meloxicam (MOBIC) 15 MG tablet Take 1 tablet (15 mg total) by mouth daily. Take with food  . Menthol-Methyl Salicylate (MUSCLE  RUB) 10-15 % CREA Apply 1 application topically daily as needed for muscle pain (muscle pain).  . methocarbamol (ROBAXIN) 500 MG tablet Take 1 tablet (500 mg total) by mouth every 8 (eight) hours as needed for muscle spasms.  . methotrexate (RHEUMATREX) 2.5 MG tablet Take 25 mg by mouth once a week. Takes 10 tablets on Sunday. Caution:Chemotherapy. Protect from light.  . metoprolol succinate (TOPROL-XL) 25 MG 24 hr tablet Take 25 mg by mouth daily.  . polyethylene glycol (MIRALAX / GLYCOLAX) packet Take 17 g by mouth every other day.  . predniSONE (DELTASONE) 1 MG tablet Take 3 mg by mouth daily with breakfast.  . rosuvastatin (CRESTOR) 5 MG tablet Take 1 tablet (5 mg total) by mouth daily.  . timolol (TIMOPTIC) 0.5 % ophthalmic solution Place 1 drop into both eyes 2 (two) times daily.   . Vitamin D, Ergocalciferol, (DRISDOL) 50000 units CAPS capsule Take 50,000 Units by mouth every 7 (seven) days.     Allergies:   Morphine; Morphine and related; Penicillins; and Penicillin g   Past Medical History:  Diagnosis Date  . Borderline diabetes   . Breast cancer ([place]) [place]  . Cataract  Bil  . Daily headache    "recently" (02/06/2012)  . Diverticulosis   . Dyspnea on exertion    "off and on for years; just worse recently" (02/06/2012)  . External hemorrhoids   . GERD (gastroesophageal reflux disease)   . Guaiac + stool 2005   "went to Dr. Henrene Pastor; took out polyps" (02/06/2012)  . H/O hiatal hernia   . History of bronchitis    "occasionally" (02/06/2012)  . Hyperlipidemia   . Hypertension   . Orthostatic lightheadedness   . Palpitations   . Rheumatoid arthritis(714.0)    PT IS FOLLOWED BY DR [place]  . Sinus bradycardia 02/04/2012  . Syncope and collapse 02/04/2012   "first time ever" (02/06/2012)    Past Surgical History:  Procedure Laterality Date  . CARDIAC CATHETERIZATION  02/05/2012   "first one ever" (02/06/2012)  .  COLONOSCOPY  11/2011  . CYSTECTOMY  1993   Left shoulder  . LEFT AND RIGHT HEART CATHETERIZATION WITH CORONARY ANGIOGRAM N/A 02/05/2012   Procedure: LEFT AND RIGHT HEART CATHETERIZATION WITH CORONARY ANGIOGRAM;  Surgeon: Sherren Mocha, MD;  Location: [facility] CATH LAB;  Service: Cardiovascular;  Laterality: N/A;  . MASTECTOMY  1995   Left  . PERMANENT PACEMAKER INSERTION N/A 02/07/2012   Procedure: PERMANENT PACEMAKER INSERTION;  Surgeon: Evans Lance, MD;  Location[facility] CATH LAB;  Service: Cardiovascular;  Laterality: N/A;  . POLYPECTOMY  ~2005; ~2008  . TONSILLECTOMY  1950's  . VAGINAL HYSTERECTOMY  1970     Social History:  The patient  reports that she has been smoking cigarettes.  She has a 25.00 pack-year smoking history. she has never used smokeless tobacco. She reports that she drinks alcohol. She reports that she does not use drugs.   Family History:  The patient's family history includes Breast cancer in her mother; Cancer in her mother; Heart attack in her mother; Heart disease in her mother.    ROS:  Please see the history of present illness. All other systems are reviewed and  Negative to the above problem except as noted.    PHYSICAL EXAM: VS:  BP (!) 142/80   Pulse 63   Ht 4\' 11"  (1.499 m)   Wt 131 lb 1.9 oz (59.5 kg)   BMI 26.48 kg/m   GEN: Well nourished, well developed, in no acute distress  HEENT: normal  Neck: JVP NLl  , carotid bruits, or masses Cardiac: RRR; no murmur, rubs, or gallops,no edema  Respiratory:  clear to auscultation bilaterally, normal work of breathing GI: soft, nontender, nondistended, + BS  No hepatomegaly  MS: no deformity Moving all extremities   Skin: warm and dry, no rash Neuro:  Strength and sensation are intact Psych: euthymic mood, full affect   EKG:  EKG is ordered today.  Atrial paced   PR 2 msec     Lipid Panel    Component Value Date/Time   CHOL 152 03/06/2016 0741   TRIG 63 03/06/2016 0741   HDL 57 03/06/2016 0741    CHOLHDL 2.7 03/06/2016 0741   CHOLHDL 2.9 03/17/2013 1310   VLDL 21 03/17/2013 1310   LDLCALC 82 03/06/2016 0741      Wt Readings from Last 3 Encounters:  02/19/17 131 lb 1.9 oz (59.5 kg)  07/14/16 132 lb 1.9 oz (59.9 kg)  12/20/15 129 lb 12.8 oz (58.9 kg)      ASSESSMENT AND PLAN:  1  HTN  BP is fair  Continue meds   2  HL  Out of crestor   WIll refill  Check lipids in 3 months  LDL in June was 118  Needs to be lower   3  Dizziness Denies    4  S/p PPM   Will arrange f/u in [facility]acer clinic   Current medicines are reviewed at length with the patient today.  The patient does not have concerns regarding medicines.  Signed, Dorris Carnes, MD  02/19/2017 10:38 AM    [address], [place], [place]  [address] Phone: [phone]; Fax: [phone]

## 2017-02-19 NOTE — Patient Instructions (Signed)
Your physician recommends that you continue on your current medications as directed. Please refer to the Current Medication list given to you today. (we refilled your rosuvastatin--Crestor--today, please restart 1 pill once a day)  Your physician recommends that you return for lab work in: 3 months (LIPIDS)  Your physician recommends that you schedule a follow-up appointment in: 3 months with Dr. Lovena Le for office visit/device check

## 2017-03-05 ENCOUNTER — Other Ambulatory Visit: Payer: Self-pay | Admitting: Emergency Medicine

## 2017-03-06 ENCOUNTER — Ambulatory Visit: Payer: Medicare Other | Admitting: Gastroenterology

## 2017-03-06 ENCOUNTER — Encounter: Payer: Self-pay | Admitting: Gastroenterology

## 2017-03-06 ENCOUNTER — Ambulatory Visit (INDEPENDENT_AMBULATORY_CARE_PROVIDER_SITE_OTHER)
Admission: RE | Admit: 2017-03-06 | Discharge: 2017-03-06 | Disposition: A | Discharge status: Home / Self Care | Payer: Medicare Other | Source: Ambulatory Visit | Attending: Gastroenterology | Admitting: Gastroenterology

## 2017-03-06 VITALS — BP 124/72 | HR 70 | Ht 59.0 in | Wt 131.4 lb

## 2017-03-06 DIAGNOSIS — R0789 Other chest pain: Secondary | ICD-10-CM

## 2017-03-06 DIAGNOSIS — K5900 Constipation, unspecified: Secondary | ICD-10-CM | POA: Diagnosis not present

## 2017-03-06 DIAGNOSIS — R0781 Pleurodynia: Secondary | ICD-10-CM

## 2017-03-06 NOTE — Patient Instructions (Signed)
We have given you samples of the following medication to take:  Linzess 145 mcg daily.   Your provider has requested that you have an x ray before leaving today. Please go to the basement floor to our Radiology department for the test.

## 2017-03-06 NOTE — Progress Notes (Signed)
03/06/2017 Carol Bright [id number] [birth year]   HISTORY OF PRESENT ILLNESS: This is an 84 year old female who is known to Dr. Henrene Pastor.  She is here today at the request of her PCP, Dr. Philip Aspen, for evaluation regarding constipation and abdominal pain.  The patient reports left sided abdominal pain.  Says that this is where the pain is primarily but sometimes she has pain all across the top of her abdomen and into her back.  Hurts with movement and when she laughs, coughs, etc.  She had a RUQ abdominal ultrasound performed that showed mildly echogenic liver with some irregularity of the surface suggesting developing cirrhosis.  CBC and CMP ok.  Bone scan one year ago showed increased activity in the left anterior first rib, which they called non-specific at that time.  She tells me that she has been using MiraLAX daily for her constipation and has not seen much results with that.  She tells me that her PCP gave her samples of movantik but it did not help.   Past Medical History:  Diagnosis Date  . Borderline diabetes   . Breast cancer ([place]) [place]  . Cataract    Bil  . Daily headache    "recently" (02/06/2012)  . Diverticulosis   . Dyspnea on exertion    "off and on for years; just worse recently" (02/06/2012)  . External hemorrhoids   . GERD (gastroesophageal reflux disease)   . Guaiac + stool 2005   "went to Dr. Henrene Pastor; took out polyps" (02/06/2012)  . H/O hiatal hernia   . History of bronchitis    "occasionally" (02/06/2012)  . Hyperlipidemia   . Hypertension   . Orthostatic lightheadedness   . Palpitations   . Rheumatoid arthritis(714.0)    PT IS FOLLOWED BY DR [place]  . Sinus bradycardia 02/04/2012  . Syncope and collapse 02/04/2012   "first time ever" (02/06/2012)   Past Surgical History:  Procedure Laterality Date  . CARDIAC CATHETERIZATION  02/05/2012   "first one ever" (02/06/2012)  . COLONOSCOPY  11/2011    . CYSTECTOMY  1993   Left shoulder  . EYE SURGERY Right   . LEFT AND RIGHT HEART CATHETERIZATION WITH CORONARY ANGIOGRAM N/A 02/05/2012   Procedure: LEFT AND RIGHT HEART CATHETERIZATION WITH CORONARY ANGIOGRAM;  Surgeon: Sherren Mocha, MD;  Location: [facility] CATH LAB;  Service: Cardiovascular;  Laterality: N/A;  . MASTECTOMY  1995   Left  . PERMANENT PACEMAKER INSERTION N/A 02/07/2012   Procedure: PERMANENT PACEMAKER INSERTION;  Surgeon: Evans Lance, MD;  Location: [facility] C[facility] LAB;  Service: Cardiovascular;  Laterality: N/A;  . POLYPECTOMY  ~2005; ~2008  . TONSILLECTOMY  1950's  . VAGINAL HYSTERECTOMY  1970    reports that she has been smoking cigarettes.  She has a 25.00 pack-year smoking history. she has never used smokeless tobacco. She reports that she drinks alcohol. She reports that she does not use drugs. family history includes Breast cancer in her mother; Cancer in her mother; Heart attack in her mother; Heart disease in her mother. Allergies  Allergen Reactions  . Morphine Anaphylaxis  . Morphine And Related     Thought she had a stroke after taking this medication. Also couldn't talk.  Marland Kitchen Penicillins Hives  . Penicillin G Rash      Outpatient Encounter Medications as of 03/06/2017  Medication Sig  . alendronate (FOSAMAX) 70 MG tablet Take 70 mg by mouth once a  week. On Wednesday. Take with a full glass of water on an empty stomach.  Marland Kitchen amLODipine (NORVASC) 5 MG tablet Take 1 tablet (5 mg total) by mouth 2 (two) times daily.  . Bevacizumab (AVASTIN IV) Inject into the vein.  . Calcium Citrate (CITRACAL PO) Take 1 tablet by mouth daily.   . cetirizine (ZYRTEC) 10 MG tablet Take 10 mg by mouth daily as needed for allergies (allergies).   . famotidine (PEPCID) 10 MG tablet Take 10 mg by mouth 2 (two) times daily.  . fluticasone (FLONASE) 50 MCG/ACT nasal spray Place 1 spray into both nostrils daily.  . folic acid (FOLVITE) 1 MG tablet Take 2 mg by mouth daily.  . lansoprazole  (PREVACID) 15 MG capsule Take 15 mg by mouth daily at 12 noon.  . Lidocaine-Menthol (1ST RELIEF SPRAY EX) Apply topically 4 (four) times daily as needed.  . meclizine (ANTIVERT) 12.5 MG tablet Take 1 tablet (12.5 mg total) by mouth 2 (two) times daily as needed for dizziness.  . meloxicam (MOBIC) 15 MG tablet Take 1 tablet (15 mg total) by mouth daily. Take with food  . methotrexate (RHEUMATREX) 2.5 MG tablet Take 25 mg by mouth once a week. Takes 10 tablets on Sunday. Caution:Chemotherapy. Protect from light.  . metoprolol succinate (TOPROL-XL) 25 MG 24 hr tablet Take 25 mg by mouth daily.  . Multiple Vitamins-Minerals (MULTIVITAMIN WITH MINERALS) tablet Take 1 tablet by mouth daily.  . naloxegol oxalate (MOVANTIK) 12.5 MG TABS tablet Take 12.5 mg by mouth daily.  . polyethylene glycol (MIRALAX / GLYCOLAX) packet Take 17 g by mouth every other day.  . predniSONE (DELTASONE) 1 MG tablet Take 3 mg by mouth daily with breakfast.  . rosuvastatin (CRESTOR) 5 MG tablet Take 1 tablet (5 mg total) by mouth daily.  . Vitamin D, Ergocalciferol, (DRISDOL) 50000 units CAPS capsule Take 50,000 Units by mouth every 7 (seven) days.  . [DISCONTINUED] [place]-Senna-Nat Lax (BIOHM COLON CLEANSER) CAPS Take 1 capsule by mouth daily.  . [DISCONTINUED] gabapentin (NEURONTIN) 300 MG capsule Take 1 capsule by mouth 3 (three) times daily.  . [DISCONTINUED] HYDROcodone-acetaminophen (NORCO/VICODIN) 5-325 MG tablet Take 1 tablet by mouth as directed.   . [DISCONTINUED] senna-docusate (SENOKOT S) 8.6-50 MG tablet Take 1 tablet by mouth at bedtime as needed for mild constipation.  . [DISCONTINUED] simvastatin (ZOCOR) 20 MG tablet Take 1 tablet (20 mg total) by mouth every evening.   No facility-administered encounter medications on file as of 03/06/2017.      REVIEW OF SYSTEMS  : All other systems reviewed and negative except where noted in the History of Present Illness.   PHYSICAL EXAM: BP 124/72   Pulse 70    Ht 4\' 11"  (1.499 m)   Wt 131 lb 6 oz (59.6 kg)   BMI 26.53 kg/m  General: Well developed black female in no acute distress Head: Normocephalic and atraumatic Eyes:  Sclerae anicteric, conjunctiva pink. Ears: Normal auditory acuity Lungs: Clear throughout to auscultation; no increased WOB.  Very tender over left ribcage/chest wall. Heart: Regular rate and rhythm; no M/R/G. Abdomen: Soft, non-distended.  BS present.  Non-tender. Musculoskeletal: Symmetrical with no gross deformities  Skin: No lesions on visible extremities Extremities: No edema  Neurological: Alert oriented x 4, grossly non-focal Psychological:  Alert and cooperative. Normal mood and affect  ASSESSMENT AND PLAN: *Chest wall pain/rib pain on the left:  I will order an x-ray, but I am going to defer this back to her PCP because  her tenderness if definitely over her chest wall, not in her abdomen.  Bone scan one year ago showed increased activity in the left anterior first rib, which they called non-specific but I wonder if this could be related to the pain she is having.   *Constipation:  Will try Linzess 145 mcg daily.  Will give samples to try.  She will call us back and give [place] an update on how it seems to be working for her.   CC:  Leanna Battles, MD

## 2017-03-06 NOTE — Progress Notes (Signed)
Physician assistant assessment and plans reviewed 

## 2017-05-18 ENCOUNTER — Ambulatory Visit (INDEPENDENT_AMBULATORY_CARE_PROVIDER_SITE_OTHER): Payer: Medicare Other | Admitting: Internal Medicine

## 2017-05-18 ENCOUNTER — Other Ambulatory Visit: Payer: Medicare Other | Admitting: *Deleted

## 2017-05-18 ENCOUNTER — Encounter: Payer: Self-pay | Admitting: Internal Medicine

## 2017-05-18 VITALS — BP 136/74 | HR 72 | Ht 59.0 in | Wt 136.0 lb

## 2017-05-18 DIAGNOSIS — R001 Bradycardia, unspecified: Secondary | ICD-10-CM

## 2017-05-18 DIAGNOSIS — I10 Essential (primary) hypertension: Secondary | ICD-10-CM | POA: Diagnosis not present

## 2017-05-18 DIAGNOSIS — E785 Hyperlipidemia, unspecified: Secondary | ICD-10-CM

## 2017-05-18 DIAGNOSIS — Z950 Presence of cardiac pacemaker: Secondary | ICD-10-CM | POA: Diagnosis not present

## 2017-05-18 LAB — LIPID PANEL
CHOL/HDL RATIO: 2.5 ratio (ref 0.0–4.4)
Cholesterol, Total: 183 mg/dL (ref 100–199)
HDL: 73 mg/dL (ref >39)
LDL CALC: 93 mg/dL (ref 0–99)
Triglycerides: 85 mg/dL (ref 0–149)
VLDL Cholesterol Cal: 17 mg/dL (ref 5–40)

## 2017-05-18 NOTE — Patient Instructions (Addendum)
Medication Instructions:  Your physician recommends that you continue on your current medications as directed. Please refer to the Current Medication list given to you today.  Labwork: None ordered.  Testing/Procedures: None ordered.  Follow-Up:  Your physician wants you to follow-up in: 6 months with the device clinic for a device check.   You will receive a reminder letter in the mail two months in advance. If you don't receive a letter, please call our office to schedule the follow-up appointment.  Your physician wants you to follow-up in: one year with Dr. Taylor.   You will receive a reminder letter in the mail two months in advance. If you don't receive a letter, please call our office to schedule the follow-up appointment.  Any Other Special Instructions Will Be Listed Below (If Applicable).  If you need a refill on your cardiac medications before your next appointment, please call your pharmacy.   

## 2017-05-18 NOTE — Progress Notes (Signed)
HPI [name] returns today for followup. She is a pleasant 84 yo woman with symptomatic bradycardia, syncope, status post pacemaker insertion, hypertension, and rheumatoid arthritis. In the interim, she has been stable except for arthritis for which she has been placed on a steroid taper. She has a h/o breast CA. She denies syncope. Her sob is stable. She has occaisional dizzy spells but no syncope.  Allergies  Allergen Reactions  . Morphine Anaphylaxis  . Morphine And Related     Thought she had a stroke after taking this medication. Also couldn't talk.  Marland Kitchen Penicillins Hives  . Penicillin G Rash     Current Outpatient Medications  Medication Sig Dispense Refill  . alendronate (FOSAMAX) 70 MG tablet Take 70 mg by mouth once a week. On Wednesday. Take with a full glass of water on an empty stomach.    Marland Kitchen amLODipine (NORVASC) 5 MG tablet Take 1 tablet (5 mg total) by mouth 2 (two) times daily. 180 tablet 3  . Bevacizumab (AVASTIN IV) Inject into the vein.    . Calcium Citrate (CITRACAL PO) Take 1 tablet by mouth daily.     . cetirizine (ZYRTEC) 10 MG tablet Take 10 mg by mouth daily as needed for allergies (allergies).     . famotidine (PEPCID) 10 MG tablet Take 10 mg by mouth 2 (two) times daily.    . fluticasone (FLONASE) 50 MCG/ACT nasal spray Place 1 spray into both nostrils daily.    . folic acid (FOLVITE) 1 MG tablet Take 2 mg by mouth daily.    . lansoprazole (PREVACID) 15 MG capsule Take 15 mg by mouth daily at 12 noon.    . Lidocaine-Menthol (1ST RELIEF SPRAY EX) Apply topically 4 (four) times daily as needed.    . meclizine (ANTIVERT) 12.5 MG tablet Take 1 tablet (12.5 mg total) by mouth 2 (two) times daily as needed for dizziness. 20 tablet 0  . meloxicam (MOBIC) 15 MG tablet Take 1 tablet (15 mg total) by mouth daily. Take with food 30 tablet 0  . methotrexate (RHEUMATREX) 2.5 MG tablet Take 25 mg by mouth once a week. Takes 10 tablets on Sunday. Caution:Chemotherapy.  Protect from light.    . metoprolol succinate (TOPROL-XL) 25 MG 24 hr tablet Take 25 mg by mouth daily.    . Multiple Vitamins-Minerals (MULTIVITAMIN WITH MINERALS) tablet Take 1 tablet by mouth daily.    . naloxegol oxalate (MOVANTIK) 12.5 MG TABS tablet Take 12.5 mg by mouth daily.    . polyethylene glycol (MIRALAX / GLYCOLAX) packet Take 17 g by mouth every other day.    . predniSONE (DELTASONE) 5 MG tablet Take 1 tablet by mouth daily.  0  . rosuvastatin (CRESTOR) 5 MG tablet Take 1 tablet (5 mg total) by mouth daily. 90 tablet 3  . timolol (TIMOPTIC) 0.5 % ophthalmic solution Place 1 drop into both eyes 2 (two) times daily.    . Vitamin D, Ergocalciferol, (DRISDOL) 50000 units CAPS capsule Take 50,000 Units by mouth every 7 (seven) days.     No current facility-administered medications for this visit.      Past Medical History:  Diagnosis Date  . Borderline diabetes   . Breast cancer ([place]) [place]  . Cataract    Bil  . Daily headache    "recently" (02/06/2012)  . Diverticulosis   . Dyspnea on exertion    "off and on for years;  just worse recently" (02/06/2012)  . External hemorrhoids   . GERD (gastroesophageal reflux disease)   . Guaiac + stool 2005   "went to Dr. Henrene Pastor; took out polyps" (02/06/2012)  . H/O hiatal hernia   . History of bronchitis    "occasionally" (02/06/2012)  . Hyperlipidemia   . Hypertension   . Orthostatic lightheadedness   . Palpitations   . Rheumatoid arthritis(714.0)    PT IS FOLLOWED BY DR [place]  . Sinus bradycardia 02/04/2012  . Syncope and collapse 02/04/2012   "first time ever" (02/06/2012)    ROS:   All systems reviewed and negative except as noted in the HPI.   Past Surgical History:  Procedure Laterality Date  . CARDIAC CATHETERIZATION  02/05/2012   "first one ever" (02/06/2012)  . COLONOSCOPY  11/2011  . CYSTECTOMY  1993   Left shoulder  . EYE SURGERY Right   . LEFT AND  RIGHT HEART CATHETERIZATION WITH CORONARY ANGIOGRAM N/A 02/05/2012   Procedure: LEFT AND RIGHT HEART CATHETERIZATION WITH CORONARY ANGIOGRAM;  Surgeon: Sherren Mocha, MD;  Location: [facility] CATH LAB;  Service: Cardiovascular;  Laterality: N/A;  . MASTECTOMY  1995   Left  . PERMANENT PACEMAKER INSERTION N/A 02/07/2012   Procedure: PERMANENT PACEMAKER INSERTION;  Surgeon: Evans Lance, MD;  Location: [facility] CATH LAB;  Service: Cardiovascular;  Laterality: N/A;  . POLYPECTOMY  ~2005; ~2008  . TONSILLECTOMY  1950's  . VAGINAL HYSTERECTOMY  1970     Family History  Problem Relation Age of Onset  . Cancer Mother        LEFT BREAST REMOVED  . Heart attack Mother   . Heart disease Mother   . Breast cancer Mother      Social History   Socioeconomic History  . Marital status: Widowed    Spouse name: Not on file  . Number of children: Not on file  . Years of education: Not on file  . Highest education level: Not on file  Occupational History  . Not on file  Social Needs  . Financial resource strain: Not on file  . Food insecurity:    Worry: Not on file    Inability: Not on file  . Transportation needs:    Medical: Not on file    Non-medical: Not on file  Tobacco Use  . Smoking status: Current Every Day Smoker    Packs/day: 0.50    Years: 50.00    Pack years: 25.00    Types: Cigarettes  . Smokeless tobacco: Never Used  . Tobacco comment: 02/06/2012 'stopped smoking 4-5 times"; offered smoking cessation materials; pt declines  Substance and Sexual Activity  . Alcohol use: Yes    Alcohol/week: 0.0 oz    Comment: 02/06/2012 "occasional; "like a holiday"  . Drug use: No  . Sexual activity: Never  Lifestyle  . Physical activity:    Days per week: Not on file    Minutes per session: Not on file  . Stress: Not on file  Relationships  . Social connections:    Talks on phone: Not on file    Gets together: Not on file    Attends religious service: Not on file    Active member of club  or organization: Not on file    Attends meetings of clubs or organizations: Not on file    Relationship status: Not on file  . Intimate partner violence:    Fear of current or ex partner: Not on file    Emotionally  abused: Not on file    Physically abused: Not on file    Forced sexual activity: Not on file  Other Topics Concern  . Not on file  Social History Narrative  . Not on file     BP 136/74   Pulse 72   Ht 4\' 11"  (1.499 m)   Wt 136 lb (61.7 kg)   SpO2 97%   BMI 27.47 kg/m   Physical Exam:  Well appearing elderly woman, NAD HEENT: Unremarkable Neck:  6 cm JVD, no thyromegally Lymphatics:  No adenopathy Back:  No CVA tenderness Lungs:  Clear with no wheezes HEART:  Regular rate rhythm, no murmurs, no rubs, no clicks Abd:  soft, positive bowel sounds, no organomegally, no rebound, no guarding Ext:  2 plus pulses, no edema, no cyanosis, no clubbing Skin:  No rashes no nodules Neuro:  CN II through XII intact, motor grossly intact   DEVICE  Normal device function.  See PaceArt for details.   Assess/Plan: 1. Sinus node dysfunction - she is asymptomatic, s/p PPM insertion.  2. PPM - her [place] [employer] DDD PPM is working normally. Will recheck in several months. 3. HTN - her blood pressure is reasonably well controlled although her sterioid taper has raised a bit today. She is encouraged to maintain a low sodium diet.  Mikle Bosworth.D.

## 2017-05-24 ENCOUNTER — Other Ambulatory Visit: Payer: Self-pay | Admitting: *Deleted

## 2017-05-24 DIAGNOSIS — E785 Hyperlipidemia, unspecified: Secondary | ICD-10-CM

## 2017-05-24 MED ORDER — ROSUVASTATIN CALCIUM 10 MG PO TABS: 10.0000 mg | ORAL_TABLET | Freq: Every day | ORAL | 3 refills | Status: DC

## 2017-06-13 ENCOUNTER — Other Ambulatory Visit: Payer: Self-pay | Admitting: Internal Medicine

## 2017-10-17 IMAGING — CR DG CHEST 2V
2 series · 2 of 2 positions shown · non-contrast
Comparison: 03/15/2013

CLINICAL DATA: Left lower flank pain.

EXAM:
CHEST  2 VIEW

[chest lat]
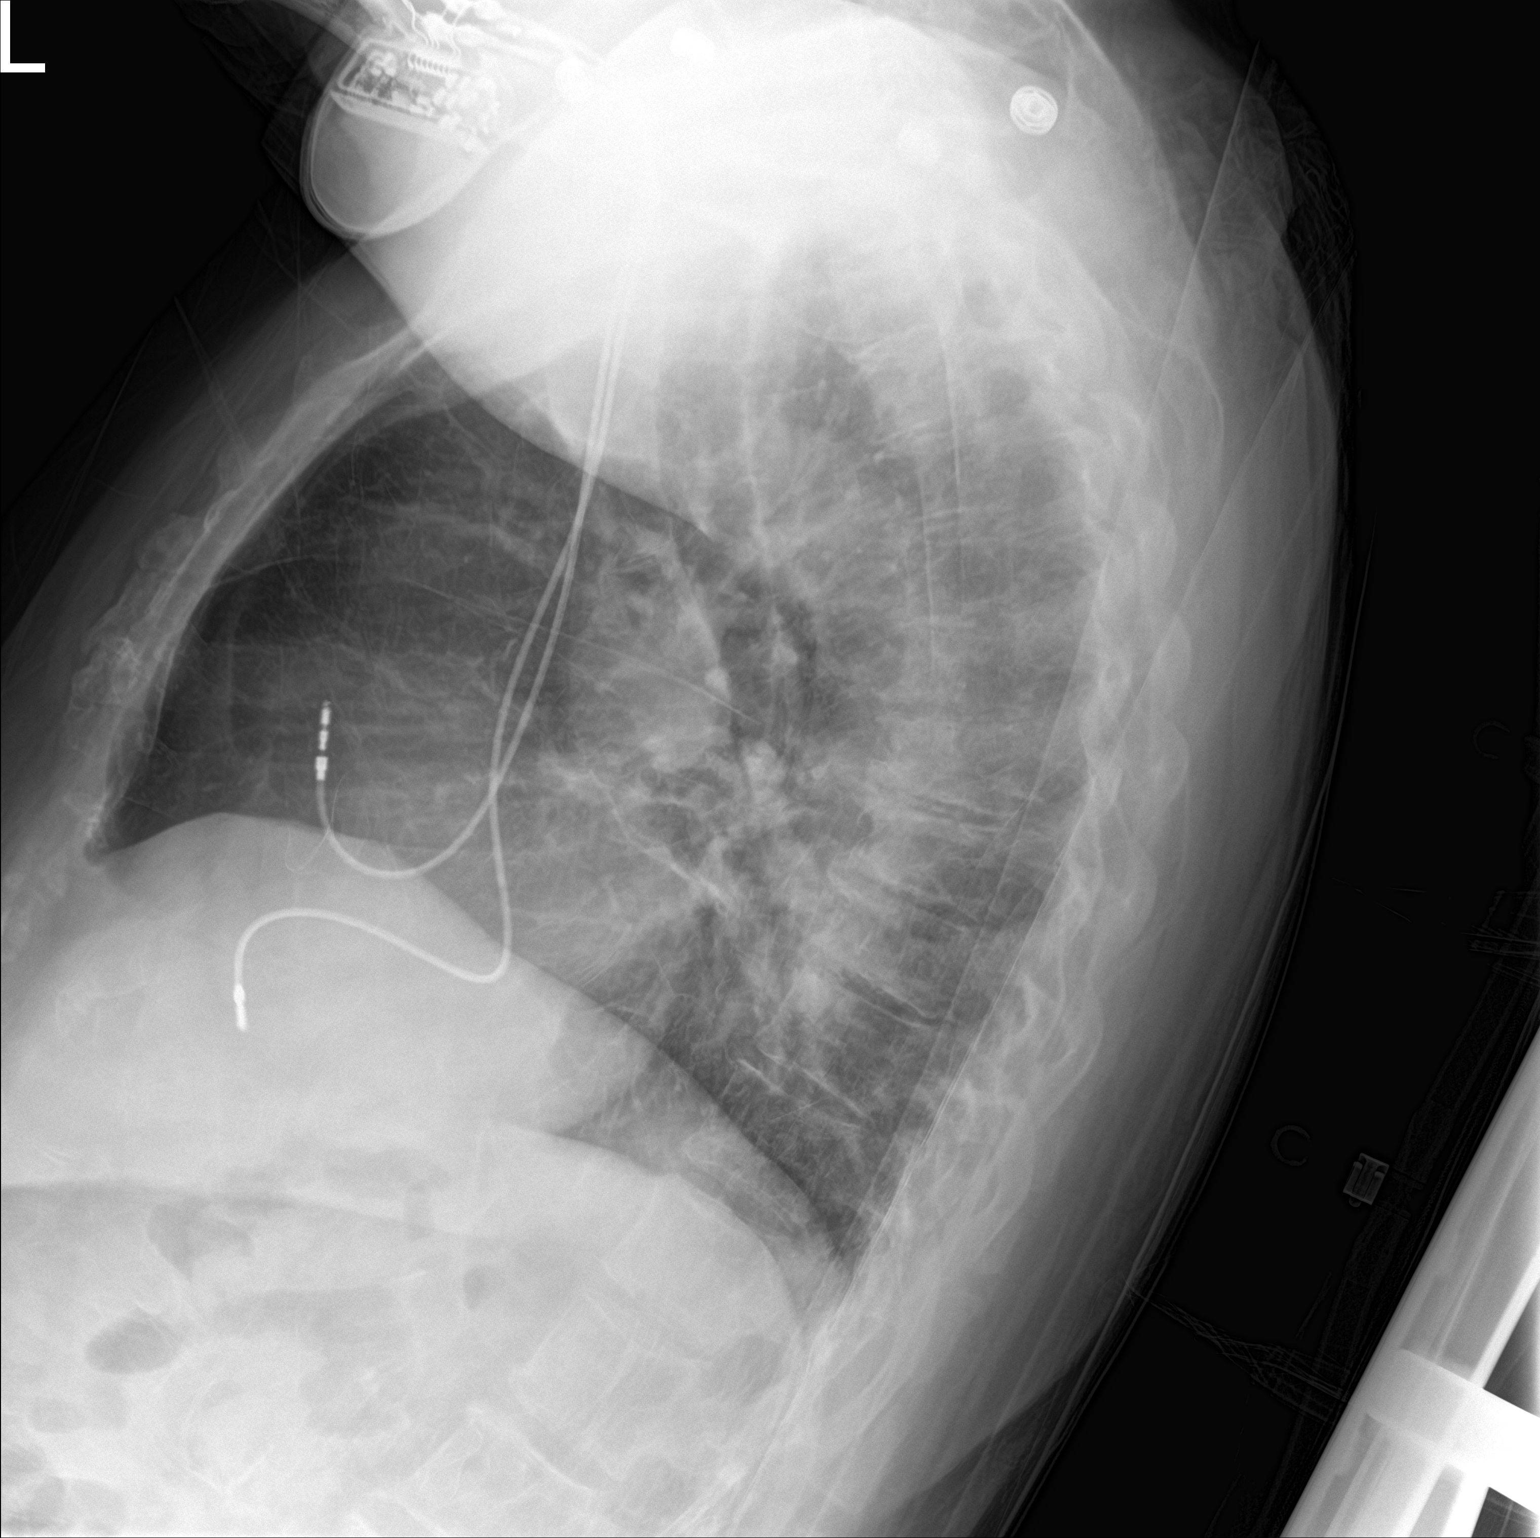

[chest ap]
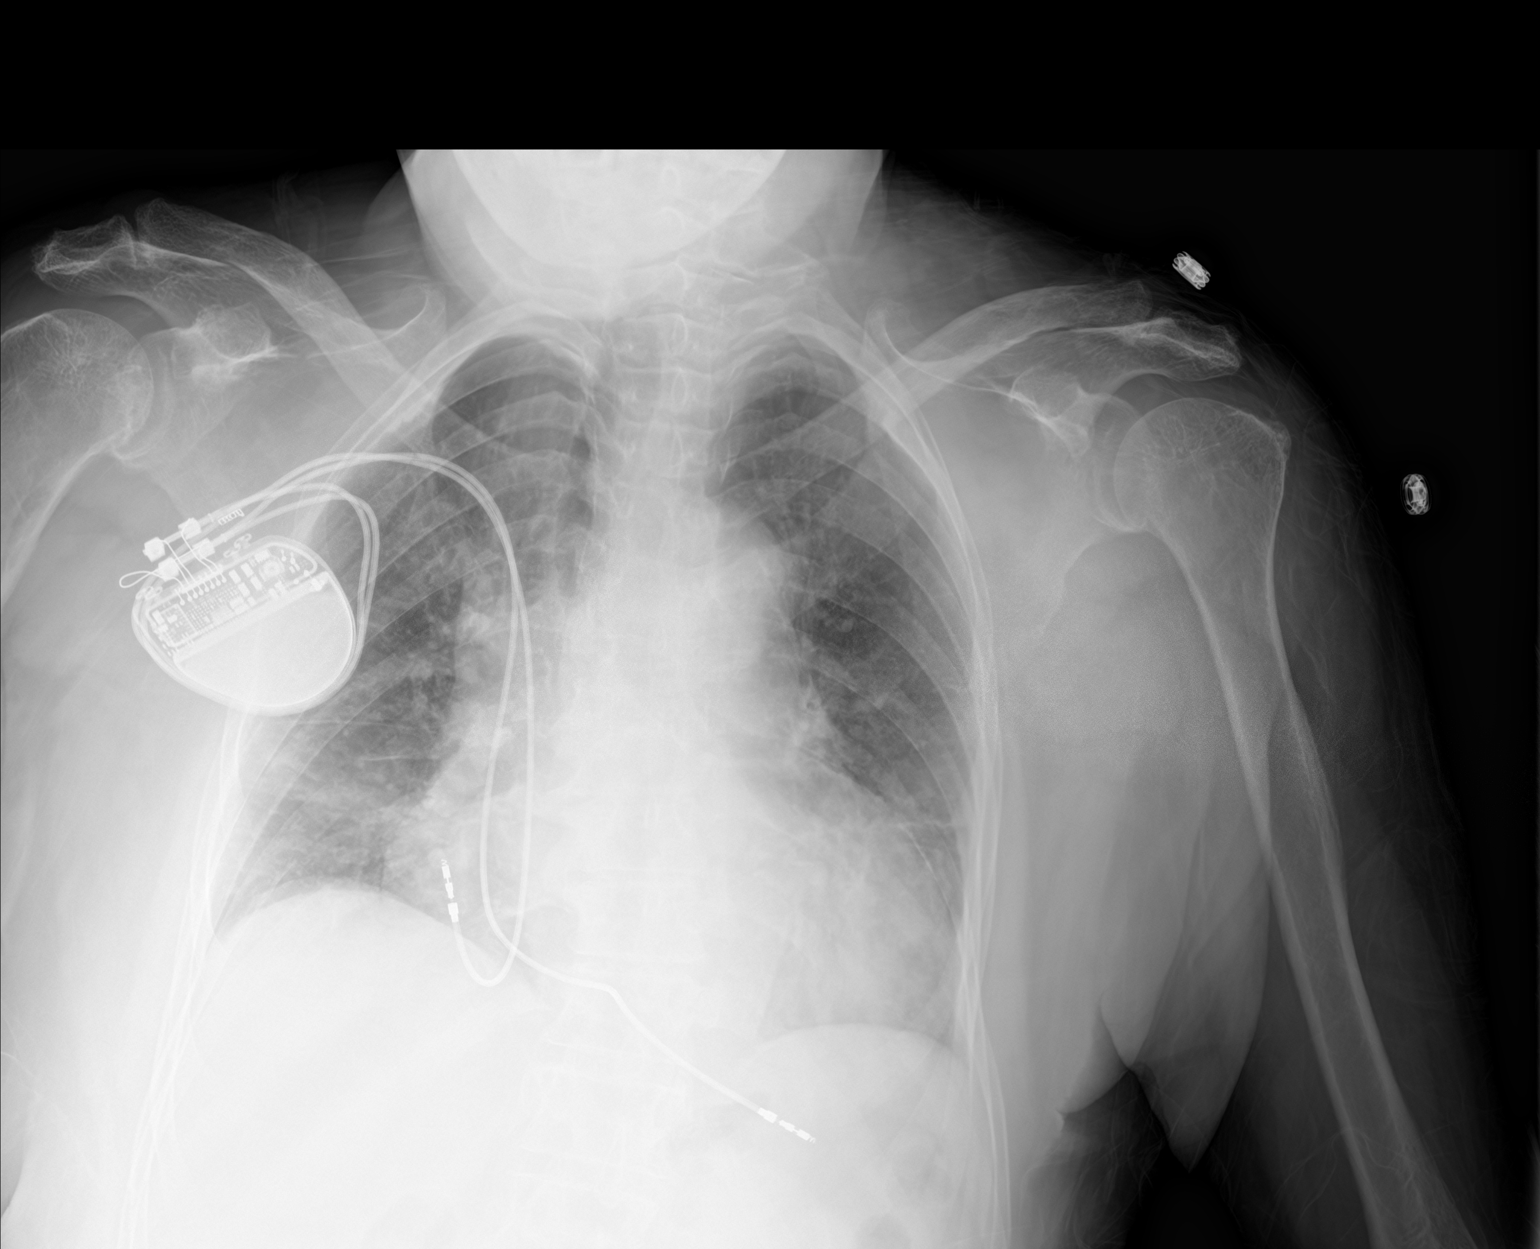

[2 of 2 positions shown; findings below may reference images not displayed]

FINDINGS: Right-sided pacemaker unchanged. Lungs are adequately inflated with
mild opacification over the right midlung and left retrocardiac
region as cannot exclude atelectasis versus infection. No evidence
of effusion. Stable cardiomegaly. Degenerative change of the spine
and shoulders.
IMPRESSION: Mild opacification over the right midlung and left retrocardiac
region which may be due to atelectasis versus infection.

Stable cardiomegaly.

## 2017-10-17 IMAGING — CR DG THORACIC SPINE 2V
3 series · 3 of 3 positions shown · non-contrast
Comparison: CT of the thoracic spine dated 07/14/2014

CLINICAL DATA: Mid to lower back pain, slightly worse on the left
side.

EXAM:
THORACIC SPINE 2 VIEWS ; LUMBAR SPINE - COMPLETE 4+ VIEW

[t-spine ap]
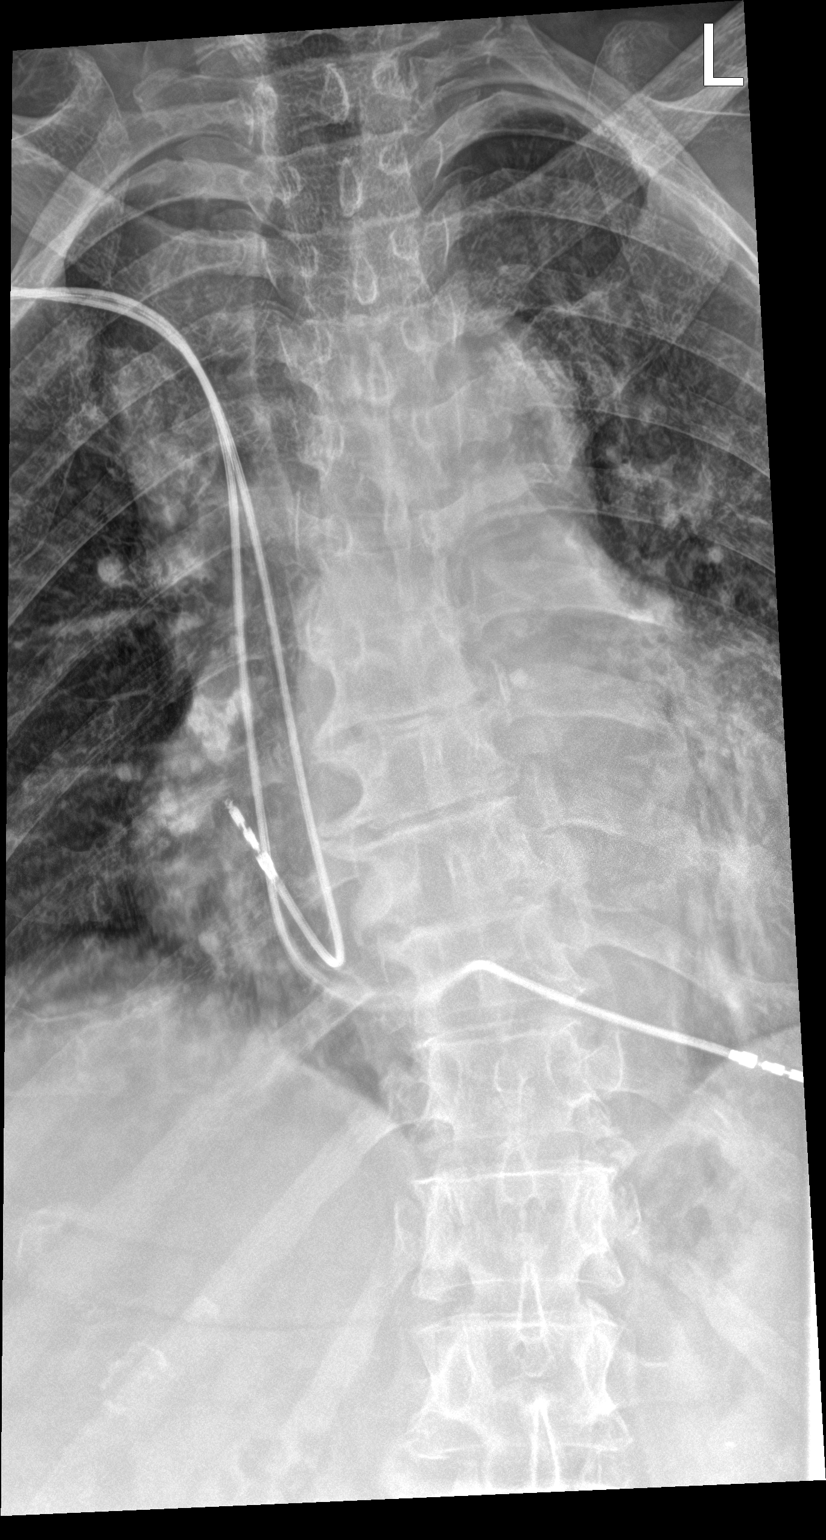

[t-spine lat]
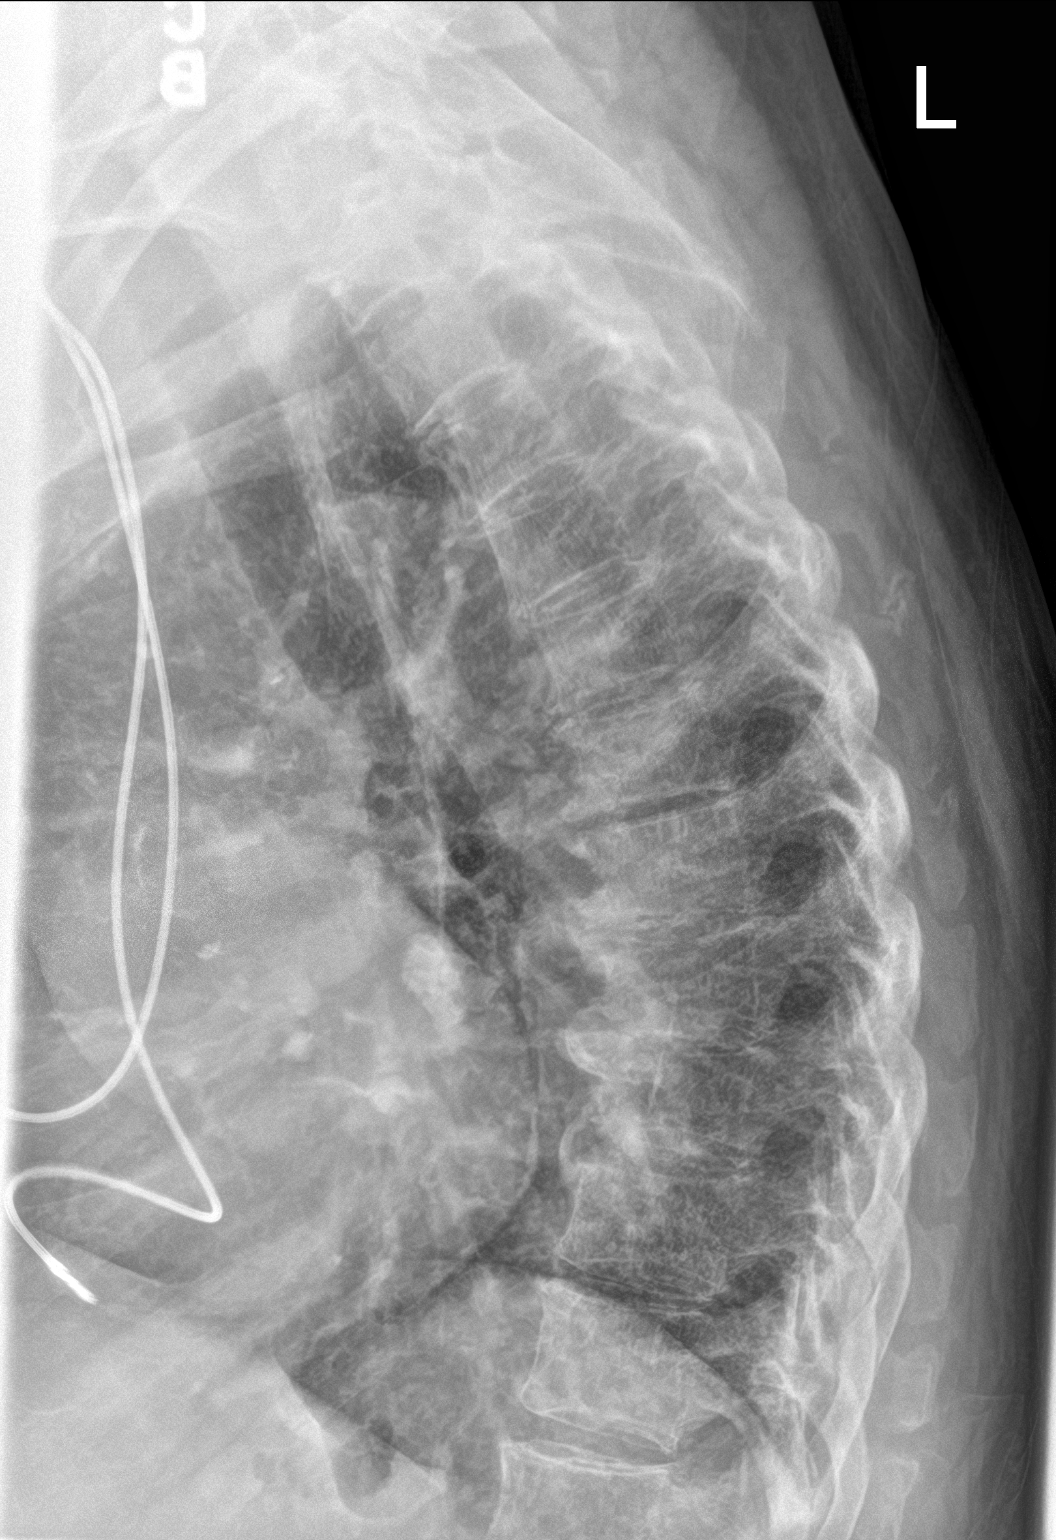

[t-spine swimmers]
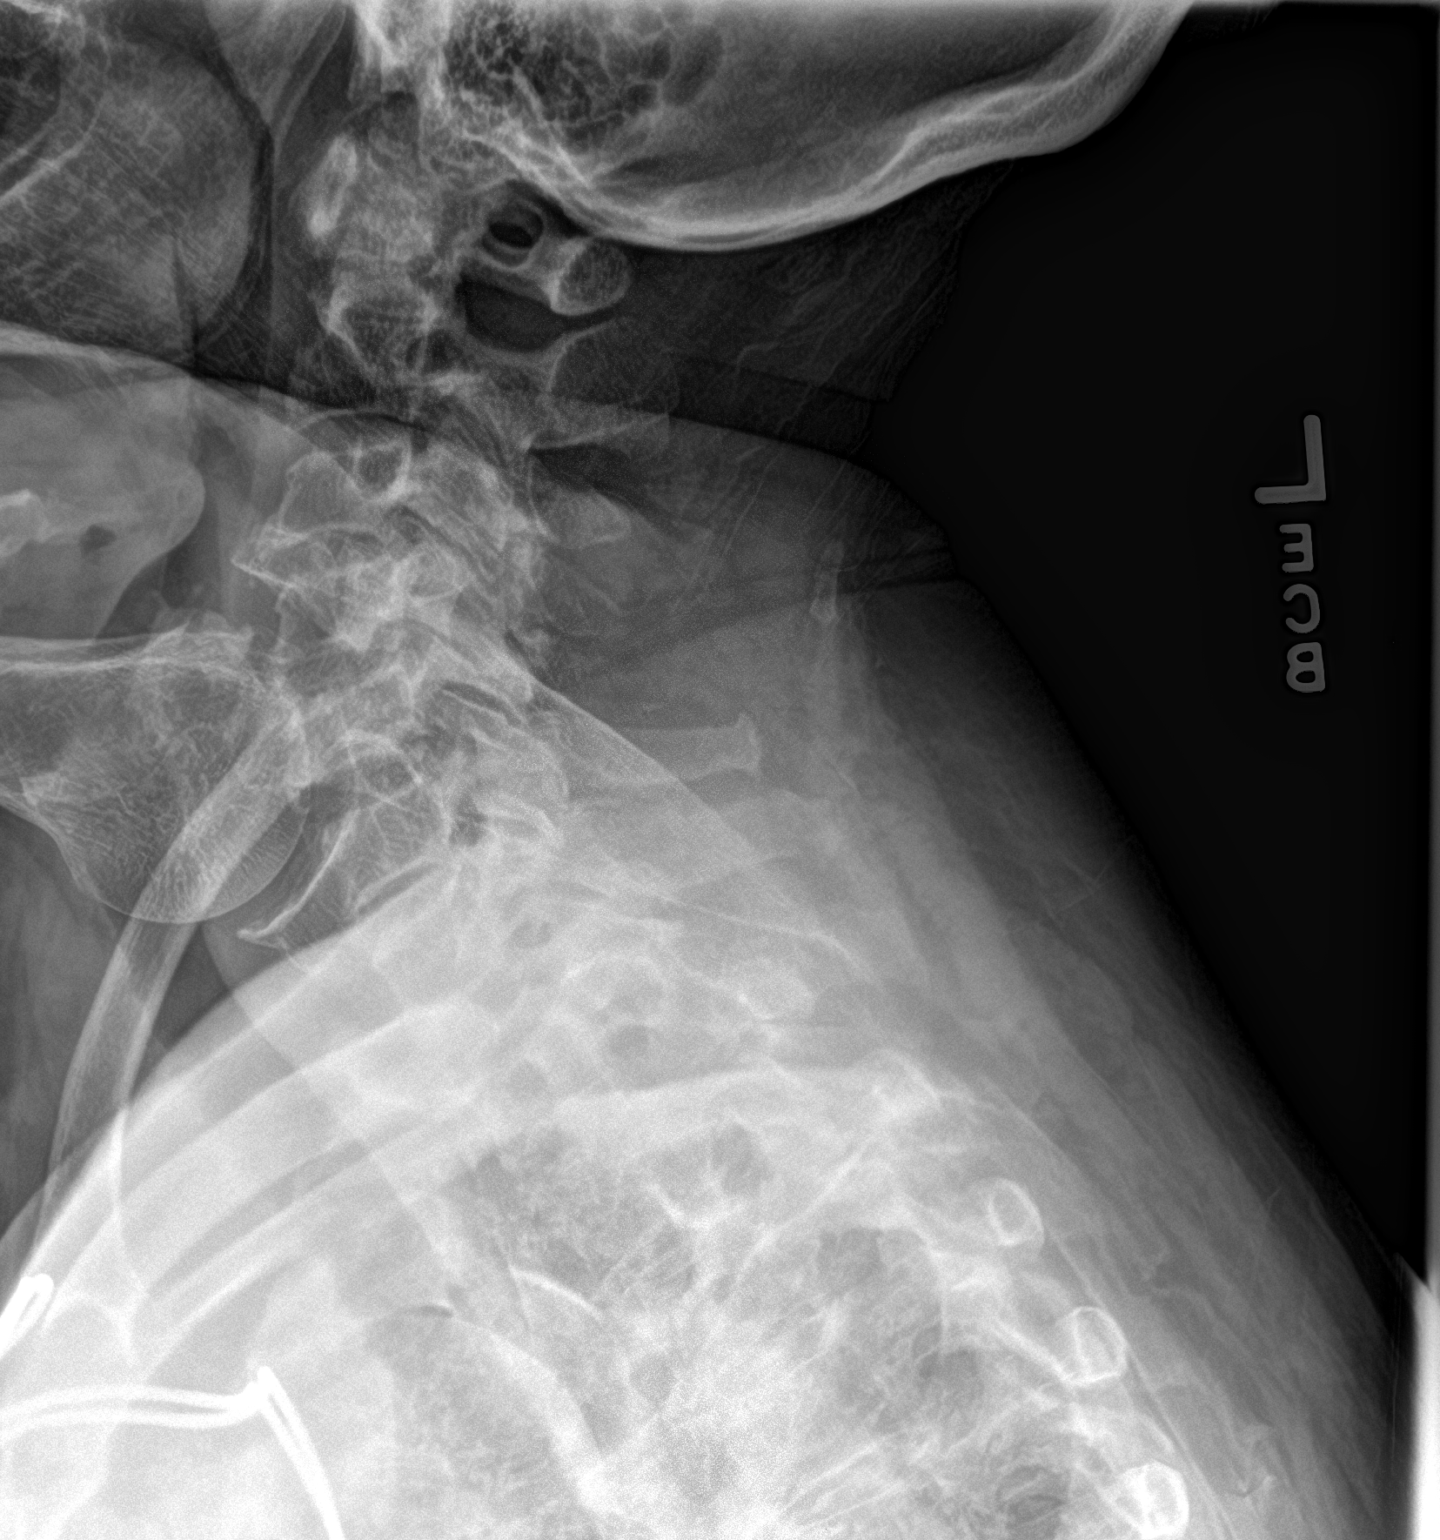

[3 of 3 positions shown; findings below may reference images not displayed]

FINDINGS: There is no evidence of thoracic whole lumbosacral spine fracture.
There are 5 non-rib-bearing vertebral bodies with transitional
anatomy of L5 vertebral body. There is exaggerated thoracic
kyphosis, likely related to moderate to severe osteoarthritic
changes of the lower thoracic spine. Multilevel osteoarthritic
changes are also seen in the lumbosacral spine. No other significant
bone abnormalities are identified. Vascular calcifications are
noted. Partially visualized cardiac pacemaker leads are seen.
IMPRESSION: No evidence of fracture or subluxation of the thoracic or
lumbosacral spine.

Osteoarthritic changes of the lower thoracic and lumbosacral spine.

## 2017-10-29 ENCOUNTER — Other Ambulatory Visit: Payer: Self-pay | Admitting: Internal Medicine

## 2017-11-14 ENCOUNTER — Ambulatory Visit (INDEPENDENT_AMBULATORY_CARE_PROVIDER_SITE_OTHER): Payer: Medicare Other | Admitting: *Deleted

## 2017-11-14 DIAGNOSIS — R001 Bradycardia, unspecified: Secondary | ICD-10-CM

## 2017-11-14 LAB — CUP PACEART INCLINIC DEVICE CHECK
Date Time Interrogation Session: 20191023040000
Implantable Lead Implant Date: 20140115
Implantable Lead Location: 753859
Implantable Lead Location: 753860
Implantable Lead Model: 4135
Implantable Lead Serial Number: 29326965
Lead Channel Impedance Value: 617 Ohm
Lead Channel Pacing Threshold Amplitude: 0.9 V
Lead Channel Pacing Threshold Pulse Width: 0.4 ms
Lead Channel Sensing Intrinsic Amplitude: 15.4 mV
Lead Channel Setting Pacing Amplitude: 2 V
Lead Channel Setting Pacing Pulse Width: 0.4 ms
Lead Channel Setting Sensing Sensitivity: 2.5 mV
MDC IDC LEAD IMPLANT DT: 20140115
MDC IDC LEAD SERIAL: 29298114
MDC IDC MSMT LEADCHNL RA IMPEDANCE VALUE: 437 Ohm
MDC IDC MSMT LEADCHNL RA PACING THRESHOLD AMPLITUDE: 0.7 V
MDC IDC MSMT LEADCHNL RA PACING THRESHOLD PULSEWIDTH: 0.4 ms
MDC IDC MSMT LEADCHNL RA SENSING INTR AMPL: 4.3 mV
MDC IDC PG IMPLANT DT: 20140115
MDC IDC PG SERIAL: 111372
MDC IDC SET LEADCHNL RV PACING AMPLITUDE: 2.4 V

## 2017-11-14 NOTE — Progress Notes (Signed)
Pacemaker check in clinic. Normal device function. Thresholds, sensing, impedances consistent with previous measurements. Device programmed to maximize longevity. 5 mode switches (<1%)-- EGMs appear AT, longest 5 seconds. No high ventricular rates noted. Device programmed at appropriate safety margins. Histogram distribution appropriate for patient activity level. Device programmed to optimize intrinsic conduction. Estimated longevity 8.5 years. Patient education completed. ROV with GT 04/2018

## 2018-02-23 DEATH — deceased

## 2018-03-01 ENCOUNTER — Other Ambulatory Visit: Payer: Self-pay | Admitting: Internal Medicine

## 2018-05-15 ENCOUNTER — Telehealth: Payer: Self-pay | Admitting: Internal Medicine

## 2018-05-15 NOTE — Telephone Encounter (Signed)
New message   [facility] for pt to call back about appt on 04.30.20. Will offer pt doxemity video call if pt has a smart phone.

## 2018-05-16 NOTE — Telephone Encounter (Signed)
NEW MESSAGE    Spoke with pt about appt. She said that she has an [place] phone but would be happy to do a phone visit with Dr. Lovena Le. Phone number is listed in the notes.      Virtual Visit Pre-Appointment Phone Call  "(Name), I am calling you today to discuss your upcoming appointment. We are currently trying to limit exposure to the virus that causes COVID-19 by seeing patients at home rather than in the office."  1. "What is the BEST phone number to call the day of the visit?" - include this in appointment notes  2. Do you have or have access to (through a family member/friend) a smartphone with video capability that we can use for your visit?" a. If yes - list this number in appt notes as cell (if different from BEST phone #) and list the appointment type as a VIDEO visit in appointment notes b. If no - list the appointment type as a PHONE visit in appointment notes  3. Confirm consent - "In the setting of the current Covid19 crisis, you are scheduled for a (phone or video) visit with your provider on (date) at (time).  Just as we do with many in-office visits, in order for you to participate in this visit, we must obtain consent.  If you'd like, I can send this to your mychart (if signed up) or email for you to review.  Otherwise, I can obtain your verbal consent now.  All virtual visits are billed to your insurance company just like a normal visit would be.  By agreeing to a virtual visit, we'd like you to understand that the technology does not allow for your provider to perform an examination, and thus may limit your provider's ability to fully assess your condition. If your provider identifies any concerns that need to be evaluated in person, we will make arrangements to do so.  Finally, though the technology is pretty good, we cannot assure that it will always work on either your or our end, and in the setting of a video visit, we may have to convert it to a phone-only visit.  In either  situation, we cannot ensure that we have a secure connection.  Are you willing to proceed?" STAFF: Did the patient verbally acknowledge consent to telehealth visit? Document YES/NO here: YES  4. Advise patient to be prepared - "Two hours prior to your appointment, go ahead and check your blood pressure, pulse, oxygen saturation, and your weight (if you have the equipment to check those) and write them all down. When your visit starts, your provider will ask you for this information. If you have an Apple Watch or Kardia device, please plan to have heart rate information ready on the day of your appointment. Please have a pen and paper handy nearby the day of the visit as well."  5. Give patient instructions for MyChart download to smartphone OR Doximity/Doxy.me as below if video visit (depending on what platform provider is using)  6. Inform patient they will receive a phone call 15 minutes prior to their appointment time (may be from unknown caller ID) so they should be prepared to answer    TELEPHONE CALL NOTE  KANDY TOWERY has been deemed a candidate for a follow-up tele-health visit to limit community exposure during the Covid-19 pandemic. I spoke with the patient via phone to ensure availability of phone/video source, confirm preferred email & phone number, and discuss instructions and expectations.  I reminded  Carol Bright to be prepared with any vital sign and/or heart rhythm information that could potentially be obtained via home monitoring, at the time of her visit. I reminded Carol Bright to expect a phone call prior to her visit.  [place] Harriette Ohara 05/16/2018 2:03 PM   INSTRUCTIONS FOR DOWNLOADING THE MYCHART APP TO SMARTPHONE  - The patient must first make sure to have activated MyChart and know their login information - If Apple, go to [employer] and type in MyChart in the search bar and download the app. If Android, ask patient to go to [employer] and type in [place] in  the search bar and download the app. The app is free but as with any other app downloads, their phone may require them to verify saved payment information or Apple/Android password.  - The patient will need to then log into the app with their MyChart username and password, and select [facility] as their healthcare provider to link the account. When it is time for your visit, go to the MyChart app, find appointments, and click Begin Video Visit. Be sure to Select Allow for your device to access the Microphone and Camera for your visit. You will then be connected, and your provider will be with you shortly.  **If they have any issues connecting, or need assistance please contact MyChart service desk [phone])**  **If using a computer, in order to ensure the best quality for their visit they will need to use either of the following Internet Browsers: [employer], or Google Chrome**  IF USING DOXIMITY or DOXY.ME - The patient will receive a link just prior to their visit by text.     FULL LENGTH CONSENT FOR TELE-HEALTH VISIT   I hereby voluntarily request, consent and authorize [facility] and its employed or contracted physicians, physician assistants, nurse practitioners or other licensed health care professionals (the Practitioner), to provide me with telemedicine health care services (the Services") as deemed necessary by the treating Practitioner. I acknowledge and consent to receive the Services by the Practitioner via telemedicine. I understand that the telemedicine visit will involve communicating with the Practitioner through live audiovisual communication technology and the disclosure of certain medical information by electronic transmission. I acknowledge that I have been given the opportunity to request an in-person assessment or other available alternative prior to the telemedicine visit and am voluntarily participating in the telemedicine visit.  I understand that  I have the right to withhold or withdraw my consent to the use of telemedicine in the course of my care at any time, without affecting my right to future care or treatment, and that the Practitioner or I may terminate the telemedicine visit at any time. I understand that I have the right to inspect all information obtained and/or recorded in the course of the telemedicine visit and may receive copies of available information for a reasonable fee.  I understand that some of the potential risks of receiving the Services via telemedicine include:   Delay or interruption in medical evaluation due to technological equipment failure or disruption;  Information transmitted may not be sufficient (e.g. poor resolution of images) to allow for appropriate medical decision making by the Practitioner; and/or   In rare instances, security protocols could fail, causing a breach of personal health information.  Furthermore, I acknowledge that it is my responsibility to provide information about my medical history, conditions and care that is complete and accurate to the best of my ability.  I acknowledge that Practitioner's advice, recommendations, and/or decision may be based on factors not within their control, such as incomplete or inaccurate data provided by me or distortions of diagnostic images or specimens that may result from electronic transmissions. I understand that the practice of medicine is not an exact science and that Practitioner makes no warranties or guarantees regarding treatment outcomes. I acknowledge that I will receive a copy of this consent concurrently upon execution via email to the email address I last provided but may also request a printed copy by calling the office of [facility].    I understand that my insurance will be billed for this visit.   I have read or had this consent read to me.  I understand the contents of this consent, which adequately explains the benefits and risks of  the Services being provided via telemedicine.   I have been provided ample opportunity to ask questions regarding this consent and the Services and have had my questions answered to my satisfaction.  I give my informed consent for the services to be provided through the use of telemedicine in my medical care  By participating in this telemedicine visit I agree to the above.

## 2018-05-23 ENCOUNTER — Other Ambulatory Visit: Payer: Self-pay

## 2018-05-23 ENCOUNTER — Telehealth (INDEPENDENT_AMBULATORY_CARE_PROVIDER_SITE_OTHER): Payer: Medicare Other | Admitting: Internal Medicine

## 2018-05-23 DIAGNOSIS — I10 Essential (primary) hypertension: Secondary | ICD-10-CM

## 2018-05-23 DIAGNOSIS — I495 Sick sinus syndrome: Secondary | ICD-10-CM | POA: Diagnosis not present

## 2018-05-23 NOTE — Progress Notes (Signed)
Electrophysiology TeleHealth Note   Due to national recommendations of social distancing due to COVID 19, an audio/video telehealth visit is felt to be most appropriate for this patient at this time.  See MyChart message from today for the patient's consent to telehealth for [facility].   Date:  05/23/2018   ID:  Carol Bright, DOB 21-Apr-1930, MRN [id number]  Location: patient's home  Provider location: [address] [place]  Evaluation Performed: Follow-up visit  PCP:  Leanna Battles, MD  Cardiologist:  No primary care provider on file. Dorris Carnes Electrophysiologist:  Dr Lovena Le  Chief Complaint:  "I'm doing ok"  History of Present Illness:    Carol Bright is a 85 y.o. female who presents via audio/video conferencing for a telehealth visit today. She is a pleasant 85 yo woman with a h/o HTN, dyslipidemia, and symptomatic bradycardia s/p PPM insertion. Since last being seen in our clinic, the patient reports doing very well.  Today, she denies symptoms of palpitations, chest pain, shortness of breath,  lower extremity edema, dizziness, presyncope, or syncope.  The patient is otherwise without complaint today.  The patient denies symptoms of fevers, chills, cough, or new SOB worrisome for COVID 19.  Past Medical History:  Diagnosis Date  . Borderline diabetes   . Breast cancer ([place]) [place]  . Cataract    Bil  . Daily headache    "recently" (02/06/2012)  . Diverticulosis   . Dyspnea on exertion    "off and on for years; just worse recently" (02/06/2012)  . External hemorrhoids   . GERD (gastroesophageal reflux disease)   . Guaiac + stool 2005   "went to Dr. Henrene Pastor; took out polyps" (02/06/2012)  . H/O hiatal hernia   . History of bronchitis    "occasionally" (02/06/2012)  . Hyperlipidemia   . Hypertension   . Orthostatic lightheadedness   . Palpitations   . Rheumatoid arthritis(714.0)    PT IS FOLLOWED BY DR  [place]  . Sinus bradycardia 02/04/2012  . Syncope and collapse 02/04/2012   "first time ever" (02/06/2012)    Past Surgical History:  Procedure Laterality Date  . CARDIAC CATHETERIZATION  02/05/2012   "first one ever" (02/06/2012)  . COLONOSCOPY  11/2011  . CYSTECTOMY  1993   Left shoulder  . EYE SURGERY Right   . LEFT AND RIGHT HEART CATHETERIZATION WITH CORONARY ANGIOGRAM N/A 02/05/2012   Procedure: LEFT AND RIGHT HEART CATHETERIZATION WITH CORONARY ANGIOGRAM;  Surgeon: Sherren Mocha, MD;  Location[facility] C[facility] LAB;  Service: Cardiovascular;  Laterality: N/A;  . MASTECTOMY  1995   Left  . PERMANENT PACEMAKER INSERTION N/A 02/07/2012   Procedure: PERMANENT PACEMAKER INSERTION;  Surgeon: Evans Lance, MD;  Location: [facility] CATH LAB;  Service: Cardiovascular;  Laterality: N/A;  . POLYPECTOMY  ~2005; ~2008  . TONSILLECTOMY  1950's  . VAGINAL HYSTERECTOMY  1970    Current Outpatient Medications  Medication Sig Dispense Refill  . alendronate (FOSAMAX) 70 MG tablet Take 70 mg by mouth once a week. On Wednesday. Take with a full glass of water on an empty stomach.    Marland Kitchen amLODipine (NORVASC) 5 MG tablet Take 1 tablet (5 mg total) by mouth 2 (two) times daily. Please keep upcoming appt for future refills. 180 tablet 0  . Bevacizumab (AVASTIN IV) Inject into the vein.    . Calcium Citrate (CITRACAL PO) Take 1 tablet by mouth  daily.     . cetirizine (ZYRTEC) 10 MG tablet Take 10 mg by mouth daily as needed for allergies (allergies).     . famotidine (PEPCID) 10 MG tablet Take 10 mg by mouth 2 (two) times daily.    . fluticasone (FLONASE) 50 MCG/ACT nasal spray Place 1 spray into both nostrils daily.    . folic acid (FOLVITE) 1 MG tablet Take 2 mg by mouth daily.    . lansoprazole (PREVACID) 15 MG capsule Take 15 mg by mouth daily at 12 noon.    . Lidocaine-Menthol (1ST RELIEF SPRAY EX) Apply topically 4 (four) times daily as needed.    . meclizine (ANTIVERT) 12.5 MG tablet Take 1  tablet (12.5 mg total) by mouth 2 (two) times daily as needed for dizziness. 20 tablet 0  . meloxicam (MOBIC) 15 MG tablet Take 1 tablet (15 mg total) by mouth daily. Take with food 30 tablet 0  . methotrexate (RHEUMATREX) 2.5 MG tablet Take 25 mg by mouth once a week. Takes 10 tablets on Sunday. Caution:Chemotherapy. Protect from light.    . metoprolol succinate (TOPROL-XL) 25 MG 24 hr tablet Take 25 mg by mouth daily.    . Multiple Vitamins-Minerals (MULTIVITAMIN WITH MINERALS) tablet Take 1 tablet by mouth daily.    . naloxegol oxalate (MOVANTIK) 12.5 MG TABS tablet Take 12.5 mg by mouth daily.    . polyethylene glycol (MIRALAX / GLYCOLAX) packet Take 17 g by mouth every other day.    . predniSONE (DELTASONE) 5 MG tablet Take 1 tablet by mouth daily.  0  . rosuvastatin (CRESTOR) 10 MG tablet Take 1 tablet (10 mg total) by mouth daily. 90 tablet 3  . timolol (TIMOPTIC) 0.5 % ophthalmic solution Place 1 drop into both eyes 2 (two) times daily.    . Vitamin D, Ergocalciferol, (DRISDOL) 50000 units CAPS capsule Take 50,000 Units by mouth every 7 (seven) days.     No current facility-administered medications for this visit.     Allergies:   Morphine; Morphine and related; Penicillins; and Penicillin g   Social History:  The patient  reports that she has been smoking cigarettes. She has a 25.00 pack-year smoking history. She has never used smokeless tobacco. She reports current alcohol use. She reports that she does not use drugs.   Family History:  The patient's  family history includes Breast cancer in her mother; Cancer in her mother; Heart attack in her mother; Heart disease in her mother.   ROS:  Please see the history of present illness.   All other systems are personally reviewed and negative.    Exam:    Vital Signs:  none   Labs/Other Tests and Data Reviewed:    Recent Labs: No results found for requested labs within last 8760 hours.   Wt Readings from Last 3 Encounters:   05/18/17 136 lb (61.7 kg)  03/06/17 131 lb 6 oz (59.6 kg)  02/19/17 131 lb 1.9 oz (59.5 kg)     Other studies personally reviewed:    ASSESSMENT & PLAN:    1.  Sinus node dysfunction - she is s/p PPM insertion and is asymptomatic. 2. PPM - she does not do remote checks. We will have her return to the office in 3 months 3. HTN - Her blood pressure has been stable at home. She is encouraged to maintain a low sodium diet. 4. COVID 19 screen The patient denies symptoms of COVID 19 at this time.  The importance of social  distancing was discussed today.  Follow-up:  July 2020 Next remote: n/a  Current medicines are reviewed at length with the patient today.   The patient does not have concerns regarding her medicines.  The following changes were made today:  none  Labs/ tests ordered today include: none No orders of the defined types were placed in this encounter.    Patient Risk:  after full review of this patients clinical status, I feel that they are at moderate risk at this time.  Today, I have spent 15 minutes with the patient with telehealth technology discussing all of the above.    Signed, [name], MD  05/23/2018 3:43 PM     [facility] [address] [place] [address] [phone] (office) [phone] (fax)

## 2018-05-24 ENCOUNTER — Other Ambulatory Visit: Payer: Self-pay | Admitting: Internal Medicine

## 2018-06-28 ENCOUNTER — Ambulatory Visit: Payer: Medicare Other | Admitting: Internal Medicine

## 2018-07-05 ENCOUNTER — Other Ambulatory Visit: Payer: Self-pay | Admitting: Internal Medicine

## 2018-08-10 ENCOUNTER — Other Ambulatory Visit: Payer: Self-pay | Admitting: Otolaryngology

## 2018-08-10 DIAGNOSIS — E049 Nontoxic goiter, unspecified: Secondary | ICD-10-CM

## 2018-08-10 DIAGNOSIS — R490 Dysphonia: Secondary | ICD-10-CM

## 2018-08-19 ENCOUNTER — Telehealth (INDEPENDENT_AMBULATORY_CARE_PROVIDER_SITE_OTHER): Payer: Medicare Other | Admitting: Internal Medicine

## 2018-08-19 ENCOUNTER — Other Ambulatory Visit: Payer: Self-pay

## 2018-08-19 VITALS — BP 147/60 | HR 73

## 2018-08-19 DIAGNOSIS — Z950 Presence of cardiac pacemaker: Secondary | ICD-10-CM | POA: Diagnosis not present

## 2018-08-19 DIAGNOSIS — E785 Hyperlipidemia, unspecified: Secondary | ICD-10-CM

## 2018-08-19 DIAGNOSIS — I10 Essential (primary) hypertension: Secondary | ICD-10-CM

## 2018-08-19 DIAGNOSIS — E782 Mixed hyperlipidemia: Secondary | ICD-10-CM

## 2018-08-19 DIAGNOSIS — R42 Dizziness and giddiness: Secondary | ICD-10-CM

## 2018-08-19 NOTE — Patient Instructions (Addendum)
Medication Instructions:  No changes If you need a refill on your cardiac medications before your next appointment, please call your pharmacy.   Lab work: None - will request from Dr. Philip Aspen  Testing/Procedures: none  Follow-Up: At [facility], you and your health needs are our priority.  As part of our continuing mission to provide you with exceptional heart care, we have created designated Provider Care Teams.  These Care Teams include your primary Cardiologist (physician) and Advanced Practice Providers (APPs -  Physician Assistants and Nurse Practitioners) who all work together to provide you with the care you need, when you need it. You will need a follow up appointment in:  December 2020-   Please call our office 2 months in advance to schedule this appointment.  You may see Dr. Dorris Carnes or one of the following Advanced Practice Providers on your designated Care Team: Richardson Dopp, PA-C [place], [place] . Daune Perch, NP  Any Other Special Instructions Will Be Listed Below (If Applicable). Requested recent blood work from your PCP Dr. Philip Aspen to be faxed to Dr. Harrington Challenger for review/mw

## 2018-08-19 NOTE — Progress Notes (Signed)
Virtual Visit via Telephone Note   This visit type was conducted due to national recommendations for restrictions regarding the COVID-19 Pandemic (e.Carol. social distancing) in an effort to limit this patient's exposure and mitigate transmission in our community.  Due to her co-morbid illnesses, this patient is at least at moderate risk for complications without adequate follow up.  This format is felt to be most appropriate for this patient at this time.  The patient did not have access to video technology/had technical difficulties with video requiring transitioning to audio format only (telephone).  All issues noted in this document were discussed and addressed.  No physical exam could be performed with this format.  Please refer to the patient's chart for her  consent to telehealth for Carol Bright.   Date:  08/19/2018   ID:  Carol Bright, DOB Sep 26, 1930, MRN [id number]  Patient Location: Home Provider Location: Home  PCP:  Carol Battles, MD  Cardiologist:  Carol Bright  Evaluation Performed:  Follow-Up Visit  Chief Complaint:  F/U of HTN  History of Present Illness:    Carol Bright is a 85 y.o. female with a hx of HTN, symptomatic bradcyarda (s/p PPM 2014), chest pain   I last saw her in clinic in Jan 2019  Pt is s/p R and L heart cath in 2014 R heart pressures normal  LAD 20 to 30% mid  Mod calcifications  LCx normal  RCA 30 to 405 mid  LVEF 55 to 65% Echo in 2015 for TIA LVEF was 55%  VEF normal  She had a televisit with Carol Bright back in April 2020  Was a little dizzy with just getting up    Breathing OK   No edema in legs     The patient does not have symptoms concerning for COVID-19 infection (fever, chills, cough, or new shortness of breath).    Past Medical History:  Diagnosis Date  . Borderline diabetes   . Breast cancer ([place]) [place]  . Cataract    Bil  . Daily headache    "recently" (02/06/2012)  . Diverticulosis    . Dyspnea on exertion    "off and on for years; just worse recently" (02/06/2012)  . External hemorrhoids   . GERD (gastroesophageal reflux disease)   . Guaiac + stool 2005   "went to Carol. Henrene Bright; took out polyps" (02/06/2012)  . H/O hiatal hernia   . History of bronchitis    "occasionally" (02/06/2012)  . Hyperlipidemia   . Hypertension   . Orthostatic lightheadedness   . Palpitations   . Rheumatoid arthritis(714.0)    PT IS FOLLOWED BY Carol Bright  . Sinus bradycardia 02/04/2012  . Syncope and collapse 02/04/2012   "first time ever" (02/06/2012)   Past Surgical History:  Procedure Laterality Date  . CARDIAC CATHETERIZATION  02/05/2012   "first one ever" (02/06/2012)  . COLONOSCOPY  11/2011  . CYSTECTOMY  1993   Left shoulder  . EYE SURGERY Right   . LEFT AND RIGHT HEART CATHETERIZATION WITH CORONARY ANGIOGRAM N/A 02/05/2012   Procedure: LEFT AND RIGHT HEART CATHETERIZATION WITH CORONARY ANGIOGRAM;  Surgeon: [facility] Mocha, MD;  Location: [facility];  Service: Cardiovascular;  Laterality: N/A;  . MASTECTOMY  1995   Left  . PERMANENT PACEMAKER INSERTION N/A 02/07/2012   Procedure: PERMANENT PACEMAKER INSERTION;  Surgeon: [facility] Lance, MD;  Location: [facility];  Service: Cardiovascular;  Laterality: N/A;  . POLYPECTOMY  ~2005; ~2008  . TONSILLECTOMY  1950's  . VAGINAL HYSTERECTOMY  1970     Current Meds  Medication Sig  . alendronate (FOSAMAX) 70 MG tablet Take 70 mg by mouth once a week. On Wednesday. Take with a full glass of water on an empty stomach.  Marland Kitchen amLODipine (NORVASC) 5 MG tablet TAKE 1 TABLET BY MOUTH TWICE DAILY  . cetirizine (ZYRTEC) 10 MG tablet Take 10 mg by mouth daily as needed for allergies (allergies).   . famotidine (PEPCID) 10 MG tablet Take 10 mg by mouth 2 (two) times daily.  . folic acid (FOLVITE) 1 MG tablet Take 2 mg by mouth daily.  . Lidocaine-Menthol (1ST RELIEF SPRAY EX) Apply topically 4 (four) times daily as needed.  .  methotrexate (RHEUMATREX) 2.5 MG tablet Take 25 mg by mouth once a week. Takes 10 tablets on Sunday. Caution:Chemotherapy. Protect from light.  . metoprolol succinate (TOPROL-XL) 25 MG 24 hr tablet Take 25 mg by mouth daily.     Allergies:   Morphine, Morphine and related, Penicillins, and Penicillin Carol   Social History   Tobacco Use  . Smoking status: Current Every Day Smoker    Packs/day: 0.50    Years: 50.00    Pack years: 25.00    Types: Cigarettes  . Smokeless tobacco: Never Used  . Tobacco comment: 02/06/2012 'stopped smoking 4-5 times"; offered smoking cessation materials; pt declines  Substance Use Topics  . Alcohol use: Yes    Alcohol/week: 0.0 standard drinks    Comment: 02/06/2012 "occasional; "like a holiday"  . Drug use: No     Family Hx: The patient's family history includes Breast cancer in her mother; Cancer in her mother; Heart attack in her mother; Heart disease in her mother.  ROS:   Please see the history of present illness.     All other systems reviewed and are negative.   Prior CV studies:   The following studies were reviewed today:  As noted above   Labs/Other Tests and Data Reviewed:    EKG:  No ECG reviewed.  Recent Labs: No results found for requested labs within last 8760 hours.   Recent Lipid Panel Bright Results  Component Value Date/Time   CHOL 183 05/18/2017 08:34 AM   TRIG 85 05/18/2017 08:34 AM   HDL 73 05/18/2017 08:34 AM   CHOLHDL 2.5 05/18/2017 08:34 AM   CHOLHDL 2.9 03/17/2013 01:10 PM   LDLCALC 93 05/18/2017 08:34 AM    Wt Readings from Last 3 Encounters:  05/18/17 136 lb (61.7 kg)  03/06/17 131 lb 6 oz (59.6 kg)  02/19/17 131 lb 1.9 oz (59.5 kg)     Objective:    Vital Signs:  BP (!) 147/60   Pulse 73    VITAL SIGNS:  reviewed  ASSESSMENT & PLAN:    1. HTN  BP is OK   I would not push further given hx of dizziness  2  S/p PPM  3  HL  Last lipids in APril 2019 LDL 93  HDL 73  Total 183  WIll get labs from Carol  Tomie Bright  4  Hx dizziness with sitting / standing Take time getting up      WIll get labs from Carol Bright     COVID-19 Education: The signs and symptoms of COVID-19 were discussed with the patient and how to seek care for testing (follow up with PCP or arrange E-visit).  The importance of social distancing  was discussed today.  Time:   Today, I have spent 32minutes with the patient with telehealth technology discussing the above problems.     Medication Adjustments/Labs and Tests Ordered: Current medicines are reviewed at length with the patient today.  Concerns regarding medicines are outlined above.   Tests Ordered: No orders of the defined types were placed in this encounter.   Medication Changes: No orders of the defined types were placed in this encounter.   Follow Up:  December 2020   SignedDorris Carnes, MD  08/19/2018 8:52 AM    [facility]

## 2018-09-03 ENCOUNTER — Ambulatory Visit
Admission: RE | Admit: 2018-09-03 | Discharge: 2018-09-03 | Disposition: A | Discharge status: Home / Self Care | Payer: Medicare Other | Source: Ambulatory Visit | Attending: Otolaryngology | Admitting: Otolaryngology

## 2018-09-03 DIAGNOSIS — R490 Dysphonia: Secondary | ICD-10-CM

## 2018-09-03 DIAGNOSIS — E049 Nontoxic goiter, unspecified: Secondary | ICD-10-CM

## 2018-09-13 ENCOUNTER — Other Ambulatory Visit: Payer: Self-pay | Admitting: Otolaryngology

## 2018-09-13 DIAGNOSIS — E049 Nontoxic goiter, unspecified: Secondary | ICD-10-CM

## 2018-10-16 ENCOUNTER — Encounter: Payer: Self-pay | Admitting: Physical Medicine and Rehabilitation

## 2018-10-16 ENCOUNTER — Other Ambulatory Visit: Payer: Self-pay

## 2018-10-16 ENCOUNTER — Ambulatory Visit (INDEPENDENT_AMBULATORY_CARE_PROVIDER_SITE_OTHER): Payer: Medicare Other | Admitting: Physical Medicine and Rehabilitation

## 2018-10-16 ENCOUNTER — Ambulatory Visit: Payer: Self-pay

## 2018-10-16 VITALS — BP 153/71 | HR 60 | Ht 59.0 in | Wt 130.0 lb

## 2018-10-16 DIAGNOSIS — G8929 Other chronic pain: Secondary | ICD-10-CM

## 2018-10-16 DIAGNOSIS — M5416 Radiculopathy, lumbar region: Secondary | ICD-10-CM

## 2018-10-16 DIAGNOSIS — M47816 Spondylosis without myelopathy or radiculopathy, lumbar region: Secondary | ICD-10-CM

## 2018-10-16 MED ORDER — METHYLPREDNISOLONE ACETATE 80 MG/ML IJ SUSP
80.0000 mg | Freq: Once | INTRAMUSCULAR | Status: AC
  Administered 2018-10-16: 15:00:00 80 mg

## 2018-10-16 NOTE — Progress Notes (Signed)
.  Numeric Pain Rating Scale and Functional Assessment Average Pain 7 Pain Right Now 3 My pain is constant, dull and aching Pain is worse with: walking and some activites Pain improves with: rest   In the last MONTH (on 0-10 scale) has pain interfered with the following?  1. General activity like being  able to carry out your everyday physical activities such as walking, climbing stairs, carrying groceries, or moving a chair?  Rating(6)  2. Relation with others like being able to carry out your usual social activities and roles such as  activities at home, at work and in your community. Rating(6)  3. Enjoyment of life such that you have  been bothered by emotional problems such as feeling anxious, depressed or irritable?  Rating(0)     +Driver, -BT, -Dye Allergies.

## 2018-10-17 ENCOUNTER — Encounter: Payer: Self-pay | Admitting: Physical Medicine and Rehabilitation

## 2018-10-17 NOTE — Progress Notes (Signed)
Carol Bright - 85 y.o. female MRN [id number]  Date of birth: 1930-02-07  Office Visit Note: Visit Date: 10/16/2018 PCP: Leanna Battles, MD Referred by: Leanna Battles, MD  Subjective: Chief Complaint  Patient presents with  . Lower Back - Pain  . Left Leg - Pain   HPI: Carol Bright is a 85 y.o. female who comes in today At the request of Dr. Janie Morning for reevaluation management of low back pain and some hip and leg pain particular on the left.  [name] is someone I have seen only on a few occasions and the last time I saw her was in January 2019.  She comes in today with worsening severe low back pain that she rates as a 7 out of 10.  She reports this is a constant dull and aching pain.  She does get some referral pain in the left leg.  Her biggest complaint is the back pain.  She reports worsening first thing in the morning but no specific night pain.  She gets pain with walking.  She has tried medications in the past and continues with some Tylenol and nothing seems to help.  She is not tolerant of any opioid medications.  She has had no new injury or trauma.  She has had no focal weakness.  She does ambulate with a cane.  She has had no groin pain.  She has a history of cardiac issues with pacemaker.  She does not have diabetes.  She has had no prior back surgery.  The last time I saw her we completed diagnostic and therapeutic facet joint blocks and this was very beneficial for a lot of her back pain.  However prior to that we completed epidural injection that was also beneficial.  We have not seen her since a year or more now and she reports really worsening over the last 3 or 4 months with specific worsening recently.  Review of Systems  Constitutional: Negative for chills, fever, malaise/fatigue and weight loss.  HENT: Negative for hearing loss and sinus pain.   Eyes: Negative for blurred vision, double vision and photophobia.  Respiratory: Negative for cough and shortness  of breath.   Cardiovascular: Negative for chest pain, palpitations and leg swelling.  Gastrointestinal: Negative for abdominal pain, nausea and vomiting.  Genitourinary: Negative for flank pain.  Musculoskeletal: Positive for back pain and joint pain. Negative for myalgias.  Skin: Negative for itching and rash.  Neurological: Negative for tremors, focal weakness and weakness.  Endo/Heme/Allergies: Negative.   Psychiatric/Behavioral: Negative for depression.  All other systems reviewed and are negative.  Otherwise per HPI.  Assessment & Plan: Visit Diagnoses:  1. Lumbar radiculopathy   2. Spondylosis without myelopathy or radiculopathy, lumbar region   3. Chronic bilateral low back pain with left-sided sciatica     Plan: Findings:  Chronic pain syndrome and chronic low back pain which is a combination of facet mediated low back pain as well as some radicular nature but thus not her biggest complaint.  She is unable to have MRI imaging but has had several CT scans and those have been reviewed and reviewed again today.  She has no reason for advanced imaging at this point she has had no changes in symptoms other than just worsening recently.  Exam is pretty nonfocal other than showing some pain with extension and facet loading and good strength otherwise.  Having a long talk with her today about her condition we are going to  repeat the epidural injection and then see her back in a few weeks to see if she needs the facet joint block once again because both of helped in the past.  We have talked about activity modification and staying active.  If she were just not getting much relief at all would entertain the idea of appropriate CT scan.  We talked about ways of strengthening at home.    Meds & Orders:  Meds ordered this encounter  Medications  . methylPREDNISolone acetate (DEPO-MEDROL) injection 80 mg    Orders Placed This Encounter  Procedures  . XR C-ARM NO REPORT  . Epidural Steroid  injection    Follow-up: Return in about 4 weeks (around 11/13/2018) for potential facet blocks.   Procedures: No procedures performed  Lumbar Epidural Steroid Injection - Interlaminar Approach with Fluoroscopic Guidance  Patient: Carol Bright      Date of Birth: [birth year] MRN: [id number] PCP: Leanna Battles, MD      Visit Date: 10/16/2018   Universal Protocol:     Consent Given By: the patient  Position: PRONE  Additional Comments: Vital signs were monitored before and after the procedure. Patient was prepped and draped in the usual sterile fashion. The correct patient, procedure, and site was verified.   Injection Procedure Details:  Procedure Site One Meds Administered:  Meds ordered this encounter  Medications  . methylPREDNISolone acetate (DEPO-MEDROL) injection 80 mg     Laterality: Left  Location/Site:  L5-S1  Needle size: 20 G  Needle type: Tuohy  Needle Placement: Paramedian epidural  Findings:   -Comments: Excellent flow of contrast into the epidural space.  Procedure Details: Using a paramedian approach from the side mentioned above, the region overlying the inferior lamina was localized under fluoroscopic visualization and the soft tissues overlying this structure were infiltrated with 4 ml. of 1% Lidocaine without Epinephrine. The Tuohy needle was inserted into the epidural space using a paramedian approach.   The epidural space was localized using loss of resistance along with lateral and bi-planar fluoroscopic views.  After negative aspirate for air, blood, and CSF, a 2 ml. volume of Isovue-250 was injected into the epidural space and the flow of contrast was observed. Radiographs were obtained for documentation purposes.    The injectate was administered into the level noted above.   Additional Comments:  The patient tolerated the procedure well Dressing: 2 x 2 sterile gauze and Band-Aid    Post-procedure details: Patient was observed  during the procedure. Post-procedure instructions were reviewed.  Patient left the clinic in stable condition.   Clinical History: No specialty comments available.   She reports that she has been smoking cigarettes. She has a 25.00 pack-year smoking history. She has never used smokeless tobacco. No results for input(s): HGBA1C, LABURIC in the last 8760 hours.  Objective:  VS:  HT:4\' 11"  (149.9 cm)   WT:130 lb (59 kg)  BMI:26.24    BP:(!) 153/71  HR:60bpm  TEMP: ( )  RESP:  Physical Exam Vitals signs and nursing note reviewed.  Constitutional:      General: She is not in acute distress.    Appearance: Normal appearance. She is well-developed. She is not ill-appearing.  HENT:     Head: Normocephalic and atraumatic.  Eyes:     Conjunctiva/sclera: Conjunctivae normal.     Pupils: Pupils are equal, round, and reactive to light.  Cardiovascular:     Rate and Rhythm: Normal rate.     Pulses: Normal pulses.  Pulmonary:  Effort: Pulmonary effort is normal.  Musculoskeletal:     Right lower leg: No edema.     Left lower leg: No edema.     Comments: Patient ambulates with a cane with an antalgic gait to the left.  She has no pain with hip rotation she has good distal strength.  No pain over the greater trochanters.  She does have pain with extension and facet loading of the low back.  Skin:    General: Skin is warm and dry.     Findings: No erythema or rash.  Neurological:     General: No focal deficit present.     Mental Status: She is alert and oriented to person, place, and time.     Sensory: No sensory deficit.     Motor: No abnormal muscle tone.     Coordination: Coordination normal.     Gait: Gait normal.  Psychiatric:        Mood and Affect: Mood normal.        Behavior: Behavior normal.     Ortho Exam Imaging: Xr C-arm No Report  Result Date: 10/16/2018 Please see Notes tab for imaging impression.   Past Medical/Family/Surgical/Social History: Medications &  Allergies reviewed per EMR, new medications updated. Patient Active Problem List   Diagnosis Date Noted  . Rib pain on left side 03/06/2017  . Back pain 12/18/2014  . RUQ abdominal pain 07/23/2014  . Nausea with vomiting 07/23/2014  . CN (constipation) 07/23/2014  . Muscle pain 02/10/2014  . Dizziness 01/26/2014  . TIA (transient ischemic attack) 03/17/2013    Class: Acute  . CAD (coronary artery disease) of artery bypass graft 03/17/2013    Class: Chronic  . Dysphagia, pharyngoesophageal phase 03/17/2013    Class: Chronic  . Altered mental status 03/15/2013    Class: Acute  . Pacemaker 05/18/2012  . Sinus bradycardia 02/04/2012  . Dyspnea on exertion   . Orthostatic lightheadedness   . Palpitations   . Syncope   . Chest wall pain 12/27/2010  . Dyslipidemia 12/27/2010  . HTN (hypertension) 12/27/2010  . Tobacco abuse 12/27/2010   Past Medical History:  Diagnosis Date  . Borderline diabetes   . Breast cancer ([place]) [place]  . Cataract    Bil  . Daily headache    "recently" (02/06/2012)  . Diverticulosis   . Dyspnea on exertion    "off and on for years; just worse recently" (02/06/2012)  . External hemorrhoids   . GERD (gastroesophageal reflux disease)   . Guaiac + stool 2005   "went to Dr. Henrene Pastor; took out polyps" (02/06/2012)  . H/O hiatal hernia   . History of bronchitis    "occasionally" (02/06/2012)  . Hyperlipidemia   . Hypertension   . Orthostatic lightheadedness   . Palpitations   . Rheumatoid arthritis(714.0)    PT IS FOLLOWED BY DR Gordon  . Sinus bradycardia 02/04/2012  . Syncope and collapse 02/04/2012   "first time ever" (02/06/2012)   Family History  Problem Relation Age of Onset  . Cancer Mother        LEFT BREAST REMOVED  . Heart attack Mother   . Heart disease Mother   . Breast cancer Mother    Past Surgical History:  Procedure Laterality Date  . CARDIAC CATHETERIZATION  02/05/2012    "first one ever" (02/06/2012)  . COLONOSCOPY  11/2011  . CYSTECTOMY  1993   Left shoulder  .  EYE SURGERY Right   . LEFT AND RIGHT HEART CATHETERIZATION WITH CORONARY ANGIOGRAM N/A 02/05/2012   Procedure: LEFT AND RIGHT HEART CATHETERIZATION WITH CORONARY ANGIOGRAM;  Surgeon: Sherren Mocha, MD;  Location: [facility] CATH LAB;  Service: Cardiovascular;  Laterality: N/A;  . MASTECTOMY  1995   Left  . PERMANENT PACEMAKER INSERTION N/A 02/07/2012   Procedure: PERMANENT PACEMAKER INSERTION;  Surgeon: Evans Lance, MD;  Location: [facility] CATH LAB;  Service: Cardiovascular;  Laterality: N/A;  . POLYPECTOMY  ~2005; ~2008  . TONSILLECTOMY  1950's  . VAGINAL HYSTERECTOMY  1970   Social History   Occupational History  . Not on file  Tobacco Use  . Smoking status: Current Every Day Smoker    Packs/day: 0.50    Years: 50.00    Pack years: 25.00    Types: Cigarettes  . Smokeless tobacco: Never Used  . Tobacco comment: 02/06/2012 'stopped smoking 4-5 times"; offered smoking cessation materials; pt declines  Substance and Sexual Activity  . Alcohol use: Yes    Alcohol/week: 0.0 standard drinks    Comment: 02/06/2012 "occasional; "like a holiday"  . Drug use: No  . Sexual activity: Never

## 2018-10-17 NOTE — Procedures (Signed)
Lumbar Epidural Steroid Injection - Interlaminar Approach with Fluoroscopic Guidance  Patient: Carol Bright      Date of Birth: Mar 26, 1930 MRN: [id number] PCP: Leanna Battles, MD      Visit Date: 10/16/2018   Universal Protocol:     Consent Given By: the patient  Position: PRONE  Additional Comments: Vital signs were monitored before and after the procedure. Patient was prepped and draped in the usual sterile fashion. The correct patient, procedure, and site was verified.   Injection Procedure Details:  Procedure Site One Meds Administered:  Meds ordered this encounter  Medications  . methylPREDNISolone acetate (DEPO-MEDROL) injection 80 mg     Laterality: Left  Location/Site:  L5-S1  Needle size: 20 G  Needle type: Tuohy  Needle Placement: Paramedian epidural  Findings:   -Comments: Excellent flow of contrast into the epidural space.  Procedure Details: Using a paramedian approach from the side mentioned above, the region overlying the inferior lamina was localized under fluoroscopic visualization and the soft tissues overlying this structure were infiltrated with 4 ml. of 1% Lidocaine without Epinephrine. The Tuohy needle was inserted into the epidural space using a paramedian approach.   The epidural space was localized using loss of resistance along with lateral and bi-planar fluoroscopic views.  After negative aspirate for air, blood, and CSF, a 2 ml. volume of Isovue-250 was injected into the epidural space and the flow of contrast was observed. Radiographs were obtained for documentation purposes.    The injectate was administered into the level noted above.   Additional Comments:  The patient tolerated the procedure well Dressing: 2 x 2 sterile gauze and Band-Aid    Post-procedure details: Patient was observed during the procedure. Post-procedure instructions were reviewed.  Patient left the clinic in stable condition.

## 2018-11-07 ENCOUNTER — Encounter: Payer: Self-pay | Admitting: Physical Medicine and Rehabilitation

## 2018-11-07 ENCOUNTER — Other Ambulatory Visit: Payer: Self-pay

## 2018-11-07 ENCOUNTER — Ambulatory Visit: Payer: Self-pay

## 2018-11-07 ENCOUNTER — Ambulatory Visit (INDEPENDENT_AMBULATORY_CARE_PROVIDER_SITE_OTHER): Payer: Medicare Other | Admitting: Physical Medicine and Rehabilitation

## 2018-11-07 VITALS — BP 146/80 | HR 63

## 2018-11-07 DIAGNOSIS — M5416 Radiculopathy, lumbar region: Secondary | ICD-10-CM | POA: Diagnosis not present

## 2018-11-07 DIAGNOSIS — G8929 Other chronic pain: Secondary | ICD-10-CM

## 2018-11-07 DIAGNOSIS — M545 Low back pain: Secondary | ICD-10-CM

## 2018-11-07 MED ORDER — METHYLPREDNISOLONE ACETATE 80 MG/ML IJ SUSP
80.0000 mg | Freq: Once | INTRAMUSCULAR | Status: AC
  Administered 2018-11-07: 80 mg

## 2018-11-07 NOTE — Progress Notes (Signed)
 .  Numeric Pain Rating Scale and Functional Assessment Average Pain 6   In the last MONTH (on 0-10 scale) has pain interfered with the following?  1. General activity like being  able to carry out your everyday physical activities such as walking, climbing stairs, carrying groceries, or moving a chair?  Rating(6)   +Driver, -BT, -Dye Allergies.  

## 2018-12-11 NOTE — Procedures (Signed)
Lumbar Epidural Steroid Injection - Interlaminar Approach with Fluoroscopic Guidance  Patient: Carol Bright      Date of Birth: [birth year] MRN: [id number] PCP: Leanna Battles, MD      Visit Date: 11/07/2018   Universal Protocol:     Consent Given By: the patient  Position: PRONE  Additional Comments: Vital signs were monitored before and after the procedure. Patient was prepped and draped in the usual sterile fashion. The correct patient, procedure, and site was verified.   Injection Procedure Details:  Procedure Site One Meds Administered:  Meds ordered this encounter  Medications  . methylPREDNISolone acetate (DEPO-MEDROL) injection 80 mg     Laterality: Right  Location/Site:  L3-L4  Needle size: 20 G  Needle type: Tuohy  Needle Placement: Paramedian epidural  Findings:   -Comments: Excellent flow of contrast into the epidural space.  Procedure Details: Using a paramedian approach from the side mentioned above, the region overlying the inferior lamina was localized under fluoroscopic visualization and the soft tissues overlying this structure were infiltrated with 4 ml. of 1% Lidocaine without Epinephrine. The Tuohy needle was inserted into the epidural space using a paramedian approach.   The epidural space was localized using loss of resistance along with lateral and bi-planar fluoroscopic views.  After negative aspirate for air, blood, and CSF, a 2 ml. volume of Isovue-250 was injected into the epidural space and the flow of contrast was observed. Radiographs were obtained for documentation purposes.    The injectate was administered into the level noted above.   Additional Comments:  The patient tolerated the procedure well Dressing: 2 x 2 sterile gauze and Band-Aid    Post-procedure details: Patient was observed during the procedure. Post-procedure instructions were reviewed.  Patient left the clinic in stable condition.

## 2018-12-11 NOTE — Progress Notes (Signed)
Carol Bright - 85 y.o. female MRN [id number]  Date of birth: [birth year]  Office Visit Note: Visit Date: 11/07/2018 PCP: Leanna Battles, MD Referred by: Leanna Battles, MD  Subjective: Chief Complaint  Patient presents with  . Middle Back - Pain   HPI:  Carol Bright is a 85 y.o. female who comes in today For planned repeat epidural injection.  A month ago we completed L5-S1 interlaminar injection that seemed to help her lower back quite a bit but not the upper lumbar pain.  She has a history of chronic pain chronic arthritis.  She refers to middle back pain that really its upper lumbar probably T1 L1 area at least on her back.  She does have some myofascial pain.  She is unable to have MRI but has had CT scans of the abdomen and pelvis which I have reviewed and does not show any high-grade bony stenosis.  We will attempt at L3-4 interlaminar injection today since she did get some relief with the prior injection at L5-S1.  Depending on relief of symptoms would look at CT scan of the lumbar spine.  Consider regrouping with physical therapy versus medication trial.  She does not tolerate opioids very well which is probably good because at her age and with her condition and will think it would be something that would help long-term anyway.  ROS Otherwise per HPI.  Assessment & Plan: Visit Diagnoses:  1. Lumbar radiculopathy   2. Chronic midline low back pain without sciatica     Plan: No additional findings.   Meds & Orders:  Meds ordered this encounter  Medications  . methylPREDNISolone acetate (DEPO-MEDROL) injection 80 mg    Orders Placed This Encounter  Procedures  . XR C-ARM NO REPORT  . Epidural Steroid injection    Follow-up: Return if symptoms worsen or fail to improve.   Procedures: No procedures performed  Lumbar Epidural Steroid Injection - Interlaminar Approach with Fluoroscopic Guidance  Patient: Carol Bright      Date of Birth: 04-03-1930 MRN: [id number]  PCP: Leanna Battles, MD      Visit Date: 11/07/2018   Universal Protocol:     Consent Given By: the patient  Position: PRONE  Additional Comments: Vital signs were monitored before and after the procedure. Patient was prepped and draped in the usual sterile fashion. The correct patient, procedure, and site was verified.   Injection Procedure Details:  Procedure Site One Meds Administered:  Meds ordered this encounter  Medications  . methylPREDNISolone acetate (DEPO-MEDROL) injection 80 mg     Laterality: Right  Location/Site:  L3-L4  Needle size: 20 G  Needle type: Tuohy  Needle Placement: Paramedian epidural  Findings:   -Comments: Excellent flow of contrast into the epidural space.  Procedure Details: Using a paramedian approach from the side mentioned above, the region overlying the inferior lamina was localized under fluoroscopic visualization and the soft tissues overlying this structure were infiltrated with 4 ml. of 1% Lidocaine without Epinephrine. The Tuohy needle was inserted into the epidural space using a paramedian approach.   The epidural space was localized using loss of resistance along with lateral and bi-planar fluoroscopic views.  After negative aspirate for air, blood, and CSF, a 2 ml. volume of Isovue-250 was injected into the epidural space and the flow of contrast was observed. Radiographs were obtained for documentation purposes.    The injectate was administered into the level noted above.   Additional Comments:  The patient tolerated  the procedure well Dressing: 2 x 2 sterile gauze and Band-Aid    Post-procedure details: Patient was observed during the procedure. Post-procedure instructions were reviewed.  Patient left the clinic in stable condition.   Clinical History: No specialty comments available.     Objective:  VS:  HT:    WT:   BMI:     BP:(!) 146/80  HR:63bpm  TEMP: ( )  RESP:  Physical Exam  Ortho Exam  Imaging: No results found.

## 2019-02-09 NOTE — Progress Notes (Signed)
Cardiology Office Note   Date:  02/10/2019   ID:  Carol Bright, DOB Jan 01, 1931, MRN [id number]  PCP:  Leanna Battles, MD  Cardiologist:   Dorris Carnes, MD   Pt presents for f/u of HTN     History of Present Illness: Carol Bright is a 86 y.o. female with a history of chest pain, HTN, RA  She is s/p PPM for bradycardia    I saw the pt last as a televisit in July 2020  She is also followed by Beckie Salts Pt notes occasional chest tightness not associated with activity   Some dizziness in am but no presyncope/syncope   Breathing is OK  No edema     Current Meds  Medication Sig  . alendronate (FOSAMAX) 70 MG tablet Take 70 mg by mouth once a week. On Wednesday. Take with a full glass of water on an empty stomach.  Marland Kitchen amLODipine (NORVASC) 5 MG tablet TAKE 1 TABLET BY MOUTH TWICE DAILY  . cetirizine (ZYRTEC) 10 MG tablet Take 10 mg by mouth daily as needed for allergies (allergies).   . famotidine (PEPCID) 10 MG tablet Take 10 mg by mouth 2 (two) times daily.  . folic acid (FOLVITE) 1 MG tablet Take 2 mg by mouth daily.  . Lidocaine-Menthol (1ST RELIEF SPRAY EX) Apply topically 4 (four) times daily as needed.  . methotrexate (RHEUMATREX) 2.5 MG tablet Take 25 mg by mouth once a week. Takes 10 tablets on Sunday. Caution:Chemotherapy. Protect from light.  . metoprolol succinate (TOPROL-XL) 25 MG 24 hr tablet Take 25 mg by mouth daily.  . polyethylene glycol (MIRALAX / GLYCOLAX) packet Take 17 g by mouth every other day.  . predniSONE (DELTASONE) 1 MG tablet Take 3 mg by mouth daily.  . rosuvastatin (CRESTOR) 10 MG tablet TAKE 1 TABLET(10 MG) BY MOUTH DAILY  . timolol (TIMOPTIC) 0.5 % ophthalmic solution Place 1 drop into both eyes 2 (two) times daily.     Allergies:   Morphine, Morphine and related, Penicillins, and Penicillin g   Past Medical History:  Diagnosis Date  . Borderline diabetes   . Breast cancer ([place]) [place]  . Cataract    Bil   . Daily headache    "recently" (02/06/2012)  . Diverticulosis   . Dyspnea on exertion    "off and on for years; just worse recently" (02/06/2012)  . External hemorrhoids   . GERD (gastroesophageal reflux disease)   . Guaiac + stool 2005   "went to Dr. Henrene Pastor; took out polyps" (02/06/2012)  . H/O hiatal hernia   . History of bronchitis    "occasionally" (02/06/2012)  . Hyperlipidemia   . Hypertension   . Orthostatic lightheadedness   . Palpitations   . Rheumatoid arthritis(714.0)    PT IS FOLLOWED BY DR [place]  . Sinus bradycardia 02/04/2012  . Syncope and collapse 02/04/2012   "first time ever" (02/06/2012)    Past Surgical History:  Procedure Laterality Date  . CARDIAC CATHETERIZATION  02/05/2012   "first one ever" (02/06/2012)  . COLONOSCOPY  11/2011  . CYSTECTOMY  1993   Left shoulder  . EYE SURGERY Right   . LEFT AND RIGHT HEART CATHETERIZATION WITH CORONARY ANGIOGRAM N/A 02/05/2012   Procedure: LEFT AND RIGHT HEART CATHETERIZATION WITH CORONARY ANGIOGRAM;  Surgeon: Sherren Mocha, MD;  Location[facility] CATH LAB;  Service: Cardiovascular;  Laterality: N/A;  . MASTECTOMY  1995   Left  .  PERMANENT PACEMAKER INSERTION N/A 02/07/2012   Procedure: PERMANENT PACEMAKER INSERTION;  Surgeon: Evans Lance, MD;  Location: [facility] CATH LAB;  Service: Cardiovascular;  Laterality: N/A;  . POLYPECTOMY  ~2005; ~2008  . TONSILLECTOMY  1950's  . VAGINAL HYSTERECTOMY  1970     Social History:  The patient  reports that she has been smoking cigarettes. She has a 25.00 pack-year smoking history. She has never used smokeless tobacco. She reports current alcohol use. She reports that she does not use drugs.   Family History:  The patient's family history includes Breast cancer in her mother; Cancer in her mother; Heart attack in her mother; Heart disease in her mother.    ROS:  Please see the history of present illness. All other systems are reviewed and  Negative to the above problem  except as noted.    PHYSICAL EXAM: VS:  BP 140/72   Pulse 62   Ht 4\' 11"  (1.499 m)   Wt 142 lb 12.8 oz (64.8 kg)   BMI 28.84 kg/m   GEN: Well nourished, well developed, in no acute distress  HEENT: normal  Neck: JVP NLl  , carotid bruits, Cardiac: RRR; no murmur, rubs, or gallops,no edema  Respiratory:  clear to auscultation bilaterally, normal work of breathing GI: soft, nontender, nondistended, + BS  No hepatomegaly  MS: no deformity Moving all extremities   Skin: warm and dry, no rash Neuro:  Strength and sensation are intact Psych: euthymic mood, full affect   EKG:  EKG is ordered today.  NSR   82 bpm   First degree av block   Lipid Panel    Component Value Date/Time   CHOL 183 05/18/2017 0834   TRIG 85 05/18/2017 0834   HDL 73 05/18/2017 0834   CHOLHDL 2.5 05/18/2017 0834   CHOLHDL 2.9 03/17/2013 1310   VLDL 21 03/17/2013 1310   LDLCALC 93 05/18/2017 0834      Wt Readings from Last 3 Encounters:  02/10/19 142 lb 12.8 oz (64.8 kg)  10/16/18 130 lb (59 kg)  05/18/17 136 lb (61.7 kg)      ASSESSMENT AND PLAN:  1  HTN  BP is OK  Keep on current meds    2  HL   Keep on Crestor   Last LDL 93   Mild plaquing on aorta  3  Dizziness In AM   She is careful   No severe spell    4  S/p PPM   Pt follows with G [facility]   5  COVID   Enrolled to wait list  For vaccine from [facility]   F/U in the fall.    Current medicines are reviewed at length with the patient today.  The patient does not have concerns regarding medicines.  Signed, Dorris Carnes, MD  02/10/2019 9:26 AM    [address], [place], [place]  [address] Phone: [phone]; Fax: [phone]

## 2019-02-10 ENCOUNTER — Encounter: Payer: Self-pay | Admitting: Internal Medicine

## 2019-02-10 ENCOUNTER — Other Ambulatory Visit: Payer: Self-pay

## 2019-02-10 ENCOUNTER — Ambulatory Visit: Payer: Medicare Other | Admitting: Internal Medicine

## 2019-02-10 VITALS — BP 140/72 | HR 62 | Ht 59.0 in | Wt 142.8 lb

## 2019-02-10 DIAGNOSIS — I10 Essential (primary) hypertension: Secondary | ICD-10-CM | POA: Diagnosis not present

## 2019-02-10 NOTE — Patient Instructions (Addendum)
Medication Instructions:  No changes *If you need a refill on your cardiac medications before your next appointment, please call your pharmacy*  Lab Work: none If you have labs (blood work) drawn today and your tests are completely normal, you will receive your results only by: Marland Kitchen MyChart Message (if you have MyChart) OR . A paper copy in the mail If you have any lab test that is abnormal or we need to change your treatment, we will call you to review the results.  Testing/Procedures: none  Follow-Up: At [facility], you and your health needs are our priority.  As part of our continuing mission to provide you with exceptional heart care, we have created designated Provider Care Teams.  These Care Teams include your primary Cardiologist (physician) and Advanced Practice Providers (APPs -  Physician Assistants and Nurse Practitioners) who all work together to provide you with the care you need, when you need it.  Your next appointment:   October 2021 with Dr. Harrington Challenger  The format for your next appointment:   Either In Person or Virtual  Provider:   You may see Dorris Carnes, MD or one of the following Advanced Practice Providers on your designated Care Team:    Richardson Dopp, PA-C  [place], [place]  Daune Perch, NP   Other Instructions

## 2019-03-09 ENCOUNTER — Ambulatory Visit: Payer: Medicare Other | Attending: Internal Medicine

## 2019-03-09 DIAGNOSIS — Z23 Encounter for immunization: Secondary | ICD-10-CM

## 2019-03-09 NOTE — Progress Notes (Signed)
   Covid-19 Vaccination Clinic  Name:  Carol Bright    MRN: [id number] DOB: 03-Dec-1930  03/09/2019  [name] was observed post Covid-19 immunization for 15 minutes without incidence. She was provided with Vaccine Information Sheet and instruction to access the V-Safe system.   [name] was instructed to call 911 with any severe reactions post vaccine: Marland Kitchen Difficulty breathing  . Swelling of your face and throat  . A fast heartbeat  . A bad rash all over your body  . Dizziness and weakness    Immunizations Administered    Name Date Dose VIS Date Route   Pfizer COVID-19 Vaccine 03/09/2019 10:09 AM 0.3 mL 01/03/2019 Intramuscular   Manufacturer: [employer], [employer]   Lot: [id number]   [place]: [id number]

## 2019-04-01 ENCOUNTER — Ambulatory Visit: Payer: Medicare Other | Attending: Internal Medicine

## 2019-04-01 DIAGNOSIS — Z23 Encounter for immunization: Secondary | ICD-10-CM

## 2019-04-01 NOTE — Progress Notes (Signed)
   Covid-19 Vaccination Clinic  Name:  Carol Bright    MRN: [id number] DOB: 05-Sep-1930  04/01/2019  [name] was observed post Covid-19 immunization for 15 minutes without incident. She was provided with Vaccine Information Sheet and instruction to access the V-Safe system.   [name] was instructed to call 911 with any severe reactions post vaccine: Marland Kitchen Difficulty breathing  . Swelling of face and throat  . A fast heartbeat  . A bad rash all over body  . Dizziness and weakness   Immunizations Administered    Name Date Dose VIS Date Route   Pfizer COVID-19 Vaccine 04/01/2019 11:41 AM 0.3 mL 01/03/2019 Intramuscular   Manufacturer: [place]   Lot: [id number]   [place]: [id number]

## 2019-05-26 ENCOUNTER — Other Ambulatory Visit: Payer: Self-pay | Admitting: Internal Medicine

## 2019-07-14 ENCOUNTER — Other Ambulatory Visit: Payer: Self-pay | Admitting: Internal Medicine

## 2019-08-06 ENCOUNTER — Encounter: Payer: Self-pay | Admitting: Internal Medicine

## 2019-08-06 ENCOUNTER — Other Ambulatory Visit: Payer: Self-pay

## 2019-08-06 ENCOUNTER — Ambulatory Visit: Payer: Medicare Other | Admitting: Internal Medicine

## 2019-08-06 VITALS — BP 138/70 | HR 61 | Ht 59.0 in | Wt 135.0 lb

## 2019-08-06 DIAGNOSIS — I495 Sick sinus syndrome: Secondary | ICD-10-CM

## 2019-08-06 DIAGNOSIS — I10 Essential (primary) hypertension: Secondary | ICD-10-CM | POA: Diagnosis not present

## 2019-08-06 NOTE — Patient Instructions (Signed)
Medication Instructions:  Your physician recommends that you continue on your current medications as directed. Please refer to the Current Medication list given to you today.  *If you need a refill on your cardiac medications before your next appointment, please call your pharmacy*  Lab Work: None ordered.  If you have labs (blood work) drawn today and your tests are completely normal, you will receive your results only by: Marland Kitchen MyChart Message (if you have MyChart) OR . A paper copy in the mail If you have any lab test that is abnormal or we need to change your treatment, we will call you to review the results.  Testing/Procedures: None ordered.  Follow-Up: At [facility], you and your health needs are our priority.  As part of our continuing mission to provide you with exceptional heart care, we have created designated Provider Care Teams.  These Care Teams include your primary Cardiologist (physician) and Advanced Practice Providers (APPs -  Physician Assistants and Nurse Practitioners) who all work together to provide you with the care you need, when you need it.  We recommend signing up for the patient portal called "MyChart".  Sign up information is provided on this After Visit Summary.  MyChart is used to connect with patients for Virtual Visits (Telemedicine).  Patients are able to view lab/test results, encounter notes, upcoming appointments, etc.  Non-urgent messages can be sent to your provider as well.   To learn more about what you can do with MyChart, go to [email or web address].    Your next appointment:   Your physician wants you to follow-up in: 1 year with Dr. Lovena Le. You will receive a reminder letter in the mail two months in advance. If you don't receive a letter, please call our office to schedule the follow-up appointment.  Other Instructions:

## 2019-08-06 NOTE — Progress Notes (Signed)
HPI Ms. Carol Bright returns today for followup. She is a patient of Dr. Harrington Challenger'. She has a h/o HTN, sinus node dysfunction s/p PPM insertion, HTN, and denies chest pain or sob. She has become a bit more sedentary but she still walks with the assistance of a cane. She has not had syncope. She denies peripheral edema.  Allergies  Allergen Reactions  . Morphine Anaphylaxis  . Morphine And Related     Thought she had a stroke after taking this medication. Also couldn't talk.  Marland Kitchen Penicillins Hives  . Penicillin G Rash     Current Outpatient Medications  Medication Sig Dispense Refill  . alendronate (FOSAMAX) 70 MG tablet Take 70 mg by mouth once a week. On Wednesday. Take with a full glass of water on an empty stomach.    Marland Kitchen amLODipine (NORVASC) 5 MG tablet TAKE 1 TABLET BY MOUTH TWICE DAILY 180 tablet 1  . cetirizine (ZYRTEC) 10 MG tablet Take 10 mg by mouth daily as needed for allergies (allergies).     . famotidine (PEPCID) 10 MG tablet Take 10 mg by mouth 2 (two) times daily.    . folic acid (FOLVITE) 1 MG tablet Take 2 mg by mouth daily.    . Lidocaine-Menthol (1ST RELIEF SPRAY EX) Apply topically 4 (four) times daily as needed.    . methotrexate (RHEUMATREX) 2.5 MG tablet Take 25 mg by mouth once a week. Takes 10 tablets on Sunday. Caution:Chemotherapy. Protect from light.    . metoprolol succinate (TOPROL-XL) 25 MG 24 hr tablet Take 25 mg by mouth daily.    . polyethylene glycol (MIRALAX / GLYCOLAX) packet Take 17 g by mouth every other day.    . predniSONE (DELTASONE) 1 MG tablet Take 3 mg by mouth daily.    . rosuvastatin (CRESTOR) 10 MG tablet TAKE 1 TABLET(10 MG) BY MOUTH DAILY 90 tablet 2  . timolol (TIMOPTIC) 0.5 % ophthalmic solution Place 1 drop into both eyes 2 (two) times daily.     No current facility-administered medications for this visit.     Past Medical History:  Diagnosis Date  . Borderline diabetes   . Breast cancer ([place]) [place]  . Cataract    Bil  . Daily headache    "recently" (02/06/2012)  . Diverticulosis   . Dyspnea on exertion    "off and on for years; just worse recently" (02/06/2012)  . External hemorrhoids   . GERD (gastroesophageal reflux disease)   . Guaiac + stool 2005   "went to Dr. Henrene Pastor; took out polyps" (02/06/2012)  . H/O hiatal hernia   . History of bronchitis    "occasionally" (02/06/2012)  . Hyperlipidemia   . Hypertension   . Orthostatic lightheadedness   . Palpitations   . Rheumatoid arthritis(714.0)    PT IS FOLLOWED BY DR [place]  . Sinus bradycardia 02/04/2012  . Syncope and collapse 02/04/2012   "first time ever" (02/06/2012)    ROS:   All systems reviewed and negative except as noted in the HPI.   Past Surgical History:  Procedure Laterality Date  . CARDIAC CATHETERIZATION  02/05/2012   "first one ever" (02/06/2012)  . COLONOSCOPY  11/2011  . CYSTECTOMY  1993   Left shoulder  . EYE SURGERY Right   . LEFT AND RIGHT HEART CATHETERIZATION WITH CORONARY ANGIOGRAM N/A 02/05/2012   Procedure: LEFT AND RIGHT HEART CATHETERIZATION WITH CORONARY ANGIOGRAM;  Surgeon:  Sherren Mocha, MD;  Location: [facility] CATH LAB;  Service: Cardiovascular;  Laterality: N/A;  . MASTECTOMY  1995   Left  . PERMANENT PACEMAKER INSERTION N/A 02/07/2012   Procedure: PERMANENT PACEMAKER INSERTION;  Surgeon: Evans Lance, MD;  Location: [facility] CATH LAB;  Service: Cardiovascular;  Laterality: N/A;  . POLYPECTOMY  ~2005; ~2008  . TONSILLECTOMY  1950's  . VAGINAL HYSTERECTOMY  1970     Family History  Problem Relation Age of Onset  . Cancer Mother        LEFT BREAST REMOVED  . Heart attack Mother   . Heart disease Mother   . Breast cancer Mother      Social History   Socioeconomic History  . Marital status: Widowed    Spouse name: Not on file  . Number of children: Not on file  . Years of education: Not on file  . Highest education level: Not on file  Occupational  History  . Not on file  Tobacco Use  . Smoking status: Current Every Day Smoker    Packs/day: 0.50    Years: 50.00    Pack years: 25.00    Types: Cigarettes  . Smokeless tobacco: Never Used  . Tobacco comment: 02/06/2012 'stopped smoking 4-5 times"; offered smoking cessation materials; pt declines  Vaping Use  . Vaping Use: Never used  Substance and Sexual Activity  . Alcohol use: Yes    Alcohol/week: 0.0 standard drinks    Comment: 02/06/2012 "occasional; "like a holiday"  . Drug use: No  . Sexual activity: Never  Other Topics Concern  . Not on file  Social History Narrative  . Not on file   Social Determinants of Health   Financial Resource Strain:   . Difficulty of Paying Living Expenses:   Food Insecurity:   . Worried About Charity fundraiser in the Last Year:   . Arboriculturist in the Last Year:   Transportation Needs:   . Film/video editor (Medical):   Marland Kitchen Lack of Transportation (Non-Medical):   Physical Activity:   . Days of Exercise per Week:   . Minutes of Exercise per Session:   Stress:   . Feeling of Stress :   Social Connections:   . Frequency of Communication with Friends and Family:   . Frequency of Social Gatherings with Friends and Family:   . Attends Religious Services:   . Active Member of Clubs or Organizations:   . Attends Archivist Meetings:   Marland Kitchen Marital Status:   Intimate Partner Violence:   . Fear of Current or Ex-Partner:   . Emotionally Abused:   Marland Kitchen Physically Abused:   . Sexually Abused:      BP 138/70   Pulse 61   Ht 4\' 11"  (1.499 m)   Wt 135 lb (61.2 kg)   SpO2 95%   BMI 27.27 kg/m   Physical Exam:  Well appearing NAD HEENT: Unremarkable Neck:  No JVD, no thyromegally Lymphatics:  No adenopathy Back:  No CVA tenderness Lungs:  Clear with no wheezes HEART:  Regular rate rhythm, no murmurs, no rubs, no clicks Abd:  soft, positive bowel sounds, no organomegally, no rebound, no guarding Ext:  2 plus pulses, no  edema, no cyanosis, no clubbing Skin:  No rashes no nodules Neuro:  CN II through XII intact, motor grossly intact  DEVICE  Normal device function.  See PaceArt for details.   Assess/Plan: 1. Sinus node dysfunction - she is asymptomatic, s/p  PPM insertion. We will follow. 2. PPM - her BS DDD PM is working normally.  3. HTN - her SBP is minimally elevated. She is encourage to reduce her salt intake.   [email or web address] [email or web address].D.

## 2019-10-07 ENCOUNTER — Ambulatory Visit (INDEPENDENT_AMBULATORY_CARE_PROVIDER_SITE_OTHER): Payer: Medicare Other | Admitting: Internal Medicine

## 2019-10-07 ENCOUNTER — Encounter: Payer: Self-pay | Admitting: Internal Medicine

## 2019-10-07 VITALS — BP 152/70 | HR 64 | Ht 58.5 in | Wt 134.0 lb

## 2019-10-07 DIAGNOSIS — K625 Hemorrhage of anus and rectum: Secondary | ICD-10-CM | POA: Diagnosis not present

## 2019-10-07 DIAGNOSIS — K5901 Slow transit constipation: Secondary | ICD-10-CM | POA: Diagnosis not present

## 2019-10-07 DIAGNOSIS — K649 Unspecified hemorrhoids: Secondary | ICD-10-CM

## 2019-10-07 MED ORDER — HYDROCORTISONE (PERIANAL) 2.5 % EX CREA: 1.0000 "application " | TOPICAL_CREAM | Freq: Every day | CUTANEOUS | 1 refills | Status: AC

## 2019-10-07 NOTE — Patient Instructions (Signed)
We have sent the following medications to your pharmacy for you to pick up at your convenience:  Anusol HC cream.  Purchase Preparation H suppositories over the counter;  Apply a small amount of the cream to the suppository and insert rectally every night at bedtime.

## 2019-10-07 NOTE — Progress Notes (Signed)
HISTORY OF PRESENT ILLNESS:  Carol Bright is a 86 y.o. female with multiple medical problems as listed below.  I have seen the patient on multiple occasions for colonoscopy.  She does have a history of nonadvanced adenomas.  Previous colonoscopies include 2004, 2007, and most recently 2013.  Most recent exam revealed no polyps.  Pandiverticulosis.  Moderate internal hemorrhoids.  She was last seen in this office by the GI physician assistant February 2019 regarding abdominal discomfort with bloating constipation.  Significant patient.  She is now sent by her primary care provider regarding rectal bleeding.  This is chronic.  Patient reports this has been going on for over 1 year.  She continues with occasional constipation.  She takes MiraLAX.  When used regularly, it helps.  She does describe some occasional rectal fullness but no severe pain.  Bleeding can occur several times per week.  This is bright red on the tissue.  She did see some blood this morning.  Review of outside blood work from September 19, 2019 shows normal comprehensive metabolic panel.  CBC reveals hemoglobin of 11.8.  MCV 100.1.  CT scan of the abdomen and pelvis from October 2017 revealed no acute findings to explain complaints of left lower quadrant pain.  She has completed her Covid vaccination series  REVIEW OF SYSTEMS:  All non-GI ROS negative as otherwise stated in the HPI except for headaches, urinary leakage  Past Medical History:  Diagnosis Date  . Borderline diabetes   . Breast cancer ([place]) [place]  . Cataract    Bil  . Daily headache    "recently" (02/06/2012)  . Diverticulosis   . Dyspnea on exertion    "off and on for years; just worse recently" (02/06/2012)  . External hemorrhoids   . GERD (gastroesophageal reflux disease)   . Guaiac + stool 2005   "went to Dr. Henrene Pastor; took out polyps" (02/06/2012)  . H/O hiatal hernia   . History of bronchitis    "occasionally"  (02/06/2012)  . Hyperlipidemia   . Hypertension   . Orthostatic lightheadedness   . Palpitations   . Rheumatoid arthritis(714.0)    PT IS FOLLOWED BY DR [place]  . Sinus bradycardia 02/04/2012  . Syncope and collapse 02/04/2012   "first time ever" (02/06/2012)    Past Surgical History:  Procedure Laterality Date  . CARDIAC CATHETERIZATION  02/05/2012   "first one ever" (02/06/2012)  . COLONOSCOPY  11/2011  . CYSTECTOMY  1993   Left shoulder  . EYE SURGERY Right   . LEFT AND RIGHT HEART CATHETERIZATION WITH CORONARY ANGIOGRAM N/A 02/05/2012   Procedure: LEFT AND RIGHT HEART CATHETERIZATION WITH CORONARY ANGIOGRAM;  Surgeon: Sherren Mocha, MD;  Location[facility] CATH LAB;  Service: Cardiovascular;  Laterality: N/A;  . MASTECTOMY  1995   Left  . PERMANENT PACEMAKER INSERTION N/A 02/07/2012   Procedure: PERMANENT PACEMAKER INSERTION;  Surgeon: Evans Lance, MD;  Location: [facility] CATH LAB;  Service: Cardiovascular;  Laterality: N/A;  . POLYPECTOMY  ~2005; ~2008  . TONSILLECTOMY  1950's  . VAGINAL HYSTERECTOMY  1970    Social History Carol Bright  reports that she has been smoking cigarettes. She has a 25.00 pack-year smoking history. She has never used smokeless tobacco. She reports current alcohol use. She reports that she does not use drugs.  family history includes Breast cancer in her mother; Cancer in her mother; Heart attack in her mother; Heart disease  in her mother.  Allergies  Allergen Reactions  . Morphine Anaphylaxis  . Morphine And Related     Thought she had a stroke after taking this medication. Also couldn't talk.  Marland Kitchen Penicillins Hives  . Penicillin G Rash       PHYSICAL EXAMINATION: Vital signs: BP (!) 152/70 (BP Location: Right Arm, Patient Position: Sitting, Cuff Size: Normal)   Pulse 64   Ht 4' 10.5" (1.486 m) Comment: height measured without shoes  Wt 134 lb (60.8 kg)   BMI 27.53 kg/m   Constitutional: generally well-appearing, no acute  distress Psychiatric: alert and oriented x3, cooperative Eyes: extraocular movements intact, anicteric, conjunctiva pink Mouth: oral pharynx moist, no lesions Neck: supple no lymphadenopathy Cardiovascular: heart regular rate and rhythm, no murmur Lungs: clear to auscultation bilaterally Abdomen: soft, nontender, nondistended, no obvious ascites, no peritoneal signs, normal bowel sounds, no organomegaly Rectal: No external abnormalities.  No tenderness.  Fullness, but no mass.  ANOSCOPY: Inflamed moderately large internal hemorrhoids Extremities: no lower extremity edema bilaterally Skin: no lesions on visible extremities Neuro: No focal deficits. No asterixis.    ASSESSMENT:  1.  Bleeding internal hemorrhoids 2.  Functional constipation 3.  History of nonadvanced adenomatous colon polyps aged out of surveillance.  Multiple previous exams as described 4.  Multiple medical problems   PLAN:  1.  Add daily fiber supplementation in the form of Metamucil 2.  Prescribe Anusol HC suppositories (or equivalent).  Take 1 at night per rectum.  Multiple refills 3.  If problems persist bleeding, could consider an office banding procedure. Total time of 30 minutes was spent preparing to see the patient, reviewing outside tests, obtaining comprehensive history, performing comprehensive physical examination and anoscopy, counseling the patient regarding her above listed issues, ordering medications and setting parameters for follow-up, and documenting clinical information in the health record

## 2020-03-27 ENCOUNTER — Other Ambulatory Visit: Payer: Self-pay | Admitting: Internal Medicine

## 2020-04-09 ENCOUNTER — Other Ambulatory Visit (HOSPITAL_COMMUNITY): Payer: Self-pay | Admitting: Internal Medicine

## 2020-04-09 DIAGNOSIS — R1313 Dysphagia, pharyngeal phase: Secondary | ICD-10-CM

## 2020-04-16 ENCOUNTER — Other Ambulatory Visit (HOSPITAL_COMMUNITY): Payer: Self-pay | Admitting: Internal Medicine

## 2020-04-16 ENCOUNTER — Ambulatory Visit (HOSPITAL_COMMUNITY)
Admission: RE | Admit: 2020-04-16 | Discharge: 2020-04-16 | Disposition: A | Discharge status: Home / Self Care | Payer: Medicare Other | Source: Ambulatory Visit | Attending: Internal Medicine | Admitting: Internal Medicine

## 2020-04-16 ENCOUNTER — Other Ambulatory Visit: Payer: Self-pay

## 2020-04-16 DIAGNOSIS — R1313 Dysphagia, pharyngeal phase: Secondary | ICD-10-CM | POA: Insufficient documentation

## 2020-04-17 ENCOUNTER — Other Ambulatory Visit: Payer: Self-pay | Admitting: Internal Medicine

## 2020-05-24 ENCOUNTER — Other Ambulatory Visit: Payer: Self-pay | Admitting: *Deleted

## 2020-05-24 MED ORDER — AMLODIPINE BESYLATE 5 MG PO TABS: 1.0000 | ORAL_TABLET | Freq: Two times a day (BID) | ORAL | 0 refills | Status: DC

## 2020-07-13 ENCOUNTER — Other Ambulatory Visit: Payer: Self-pay | Admitting: Internal Medicine

## 2020-08-04 ENCOUNTER — Telehealth: Payer: Self-pay | Admitting: Physical Medicine and Rehabilitation

## 2020-08-04 NOTE — Telephone Encounter (Signed)
Pt saw Dr. Ernestina Patches previous and would like to get back in. Pt says in severe pain and best call back number is [phone]

## 2020-08-05 NOTE — Telephone Encounter (Signed)
Last seen in 2020. Called patient to schedule OV. No answer after multiple rings and no voicemail.

## 2020-08-05 NOTE — Telephone Encounter (Signed)
Scheduled for 7/15 at 1000.

## 2020-08-06 ENCOUNTER — Encounter: Payer: Self-pay | Admitting: Physical Medicine and Rehabilitation

## 2020-08-06 ENCOUNTER — Other Ambulatory Visit: Payer: Self-pay

## 2020-08-06 ENCOUNTER — Ambulatory Visit (INDEPENDENT_AMBULATORY_CARE_PROVIDER_SITE_OTHER): Payer: Medicare Other | Admitting: Physical Medicine and Rehabilitation

## 2020-08-06 VITALS — BP 182/77 | HR 60

## 2020-08-06 DIAGNOSIS — N3001 Acute cystitis with hematuria: Secondary | ICD-10-CM | POA: Diagnosis not present

## 2020-08-06 DIAGNOSIS — M545 Low back pain, unspecified: Secondary | ICD-10-CM | POA: Diagnosis not present

## 2020-08-06 DIAGNOSIS — M5416 Radiculopathy, lumbar region: Secondary | ICD-10-CM | POA: Diagnosis not present

## 2020-08-06 DIAGNOSIS — R350 Frequency of micturition: Secondary | ICD-10-CM | POA: Diagnosis not present

## 2020-08-06 DIAGNOSIS — G8929 Other chronic pain: Secondary | ICD-10-CM

## 2020-08-06 LAB — POCT URINALYSIS DIPSTICK
Bilirubin, UA: NEGATIVE
Blood, UA: NEGATIVE
Glucose, UA: NEGATIVE
Ketones, UA: NEGATIVE
Nitrite, UA: NEGATIVE
Protein, UA: NEGATIVE
Spec Grav, UA: <=1.005 — AB (ref 1.010–1.025)
Urobilinogen, UA: NEGATIVE E.U./dL — AB
pH, UA: 5 (ref 5.0–8.0)

## 2020-08-06 MED ORDER — SULFAMETHOXAZOLE-TRIMETHOPRIM 800-160 MG PO TABS: 1.0000 | ORAL_TABLET | Freq: Two times a day (BID) | ORAL | 0 refills | Status: AC

## 2020-08-06 NOTE — Progress Notes (Signed)
Carol Bright - 87 y.o. female MRN [id number]  Date of birth: 12/12/1930  Office Visit Note: Visit Date: 08/06/2020 PCP: Leanna Battles, MD Referred by: Leanna Battles, MD  Subjective: Chief Complaint  Patient presents with   Lower Back - Pain   HPI: Carol Bright is a 87 y.o. female who comes in today for evaluation of chronic, worsening and severe lower back pain.  Today, patient reports pain is greater on the left than right and also verbalizes new symptoms of pain radiation to the abdomen, urinary frequency, and hematuria.  Patient reports pain started approximately 5 days ago.  Patient states her pain worsens with activity and walking.  Patient reports she is very uncomfortable and rates her pain at a 10 out of 10 at present.  Patient states that she feels bloated and is having trouble emptying her bladder completely.  Patient verbalizes that she is taking Tylenol at home and using a heating pad with some relief of pain.  Patient reports she is having difficulty sleeping due to urinary frequency.  Patient had right L3-L4 interlaminar epidural steroid injection in October 2020 that has provided her with significant and sustained relief until recently.  Patient does have a pacemaker and is unable to undergo lumbar MRI.  Patient's CT of her abdomen and pelvis in 2017 exhibits degenerative changes of the lumbar spine, with no evidence of canal stenosis. Patient denies focal weakness, tingling, and numbness.  Patient denies any recent trauma or falls.   Patient's course is complicated by coronary artery disease, hypertension, palpitations, and tobacco use.      Review of Systems  Genitourinary:  Positive for frequency, hematuria and urgency.  Musculoskeletal:  Positive for back pain.  All other systems reviewed and are negative. Otherwise per HPI.  Assessment & Plan: Visit Diagnoses:    ICD-10-CM   1. Chronic bilateral low back pain, unspecified whether sciatica present  M54.50 POCT  urinalysis dipstick   G89.29 CANCELED: Urinalysis, dipstick only    2. Lumbar radiculopathy  M54.16     3. Acute cystitis with hematuria  N30.01 CT ABDOMEN PELVIS WO CONTRAST    4. Urinary frequency  R35.0        Plan: Findings:  Chronic, worsening, and severe left sided lumbar radiculopathy.  Patient continues to have left lower back pain, however pain is now radiating around to her abdomen.  Patient also has complaints of urinary frequency and hematuria.  Patient's symptoms, exam and leukocytosis noted on urine dipstick are consistent with acute cystitis.  We discussed the patient's plan of care in detail and did prescribe Bactrim in the office today.  A noncontrast CT of the abdomen and pelvis was also ordered.  If patient's urinary tract infection is not improving we will then reevaluate and consider an epidural steroid injection the imaging has been completed.  Physical therapy will also be considered if patient continues to have left lower back pain.  Patient educated on good hygiene practices and instructed to take antibiotic until completion.    Meds & Orders:  Meds ordered this encounter  Medications   sulfamethoxazole-trimethoprim (BACTRIM DS) 800-160 MG tablet    Sig: Take 1 tablet by mouth 2 (two) times daily for 3 days.    Dispense:  6 tablet    Refill:  0    Order Specific Question:   Supervising Provider    Answer:   Magnus Sinning [[id number]]    Orders Placed This Encounter  Procedures   CT ABDOMEN  PELVIS WO CONTRAST   POCT urinalysis dipstick    Follow-up: Return if symptoms worsen or fail to improve.   Procedures: No procedures performed      Clinical History: No specialty comments available.   She reports that she has been smoking cigarettes. She has a 25.00 pack-year smoking history. She has never used smokeless tobacco. No results for input(s): HGBA1C, LABURIC in the last 8760 hours.  Objective:  VS:  HT:    WT:   BMI:     BP:(!) 182/77  HR:60bpm  TEMP:  ( )  RESP:  Physical Exam Constitutional:      Appearance: She is ill-appearing.  HENT:     Head: Normocephalic and atraumatic.     Right Ear: Tympanic membrane normal.     Left Ear: Tympanic membrane normal.     Nose: Nose normal.     Mouth/Throat:     Mouth: Mucous membranes are moist.  Eyes:     Pupils: Pupils are equal, round, and reactive to light.  Cardiovascular:     Rate and Rhythm: Normal rate.     Pulses: Normal pulses.  Pulmonary:     Effort: Pulmonary effort is normal.  Abdominal:     General: There is distension.  Musculoskeletal:     Cervical back: Normal range of motion and neck supple.     Comments: Pt is slow to rise from seated to standing position.Good lumbar range of motion. Strong distal strength without clonus. Gait is slow and unsteady, pt requires assistance with cane/walker.   Skin:    General: Skin is warm and dry.     Capillary Refill: Capillary refill takes less than 2 seconds.  Neurological:     General: No focal deficit present.     Mental Status: She is oriented to person, place, and time.  Psychiatric:        Mood and Affect: Mood normal.    Ortho Exam  Imaging: No results found.  Past Medical/Family/Surgical/Social History: Medications & Allergies reviewed per EMR, new medications updated. Patient Active Problem List   Diagnosis Date Noted   Rib pain on left side 03/06/2017   Back pain 12/18/2014   RUQ abdominal pain 07/23/2014   Nausea with vomiting 07/23/2014   CN (constipation) 07/23/2014   Muscle pain 02/10/2014   Dizziness 01/26/2014   TIA (transient ischemic attack) 03/17/2013    Class: Acute   CAD (coronary artery disease) of artery bypass graft 03/17/2013    Class: Chronic   Dysphagia, pharyngoesophageal phase 03/17/2013    Class: Chronic   Altered mental status 03/15/2013    Class: Acute   Pacemaker 05/18/2012   Sinus bradycardia 02/04/2012   Dyspnea on exertion    Orthostatic lightheadedness    Palpitations     Syncope    Chest wall pain 12/27/2010   Dyslipidemia 12/27/2010   HTN (hypertension) 12/27/2010   Tobacco abuse 12/27/2010   Past Medical History:  Diagnosis Date   Borderline diabetes    Breast cancer ([place]) 1996   BREAST CANCER -LEFT BREAST REMOVED -  1996   Cataract    Bil   Daily headache    "recently" (02/06/2012)   Diverticulosis    Dyspnea on exertion    "off and on for years; just worse recently" (02/06/2012)   External hemorrhoids    GERD (gastroesophageal reflux disease)    Guaiac + stool 2005   "went to Dr. Henrene Pastor; took out polyps" (02/06/2012)   H/O hiatal hernia  History of bronchitis    "occasionally" (02/06/2012)   Hyperlipidemia    Hypertension    Orthostatic lightheadedness    Palpitations    Rheumatoid arthritis(714.0)    PT IS FOLLOWED BY DR Kayren Eaves [facility]   Sinus bradycardia 02/04/2012   Syncope and collapse 02/04/2012   "first time ever" (02/06/2012)   Family History  Problem Relation Age of Onset   Cancer Mother        LEFT BREAST REMOVED   Heart attack Mother    Heart disease Mother    Breast cancer Mother    Past Surgical History:  Procedure Laterality Date   CARDIAC CATHETERIZATION  02/05/2012   "first one ever" (02/06/2012)   COLONOSCOPY  11/2011   CYSTECTOMY  1993   Left shoulder   EYE SURGERY Right    LEFT AND RIGHT HEART CATHETERIZATION WITH CORONARY ANGIOGRAM N/A 02/05/2012   Procedure: LEFT AND RIGHT HEART CATHETERIZATION WITH CORONARY ANGIOGRAM;  Surgeon: Sherren Mocha, MD;  Location[facility] CATH LAB;  Service: Cardiovascular;  Laterality: N/A;   MASTECTOMY  1995   Left   PERMANENT PACEMAKER INSERTION N/A 02/07/2012   Procedure: PERMANENT PACEMAKER INSERTION;  Surgeon: Evans Lance, MD;  Location: [facility] CATH LAB;  Service: Cardiovascular;  Laterality: N/A;   POLYPECTOMY  ~2005; ~2008   TONSILLECTOMY  1950's   VAGINAL HYSTERECTOMY  1970   Social History   Occupational History   Occupation: retired  Tobacco Use   Smoking  status: Every Day    Packs/day: 0.50    Years: 50.00    Pack years: 25.00    Types: Cigarettes   Smokeless tobacco: Never   Tobacco comments:    02/06/2012 'stopped smoking 4-5 times"; offered smoking cessation materials; pt declines  Vaping Use   Vaping Use: Never used  Substance and Sexual Activity   Alcohol use: Yes    Alcohol/week: 0.0 standard drinks    Comment: 02/06/2012 "occasional; "like a holiday"   Drug use: No   Sexual activity: Never

## 2020-08-06 NOTE — Progress Notes (Signed)
Pain started about 2 weeks ago but stopped after a few days. Started again Sunday and she states that she could not get out of the bed. Pain across lower back. Feels like it sometimes presses in stomach and sides. States that she has been having increased urination- "I go to the bathroom every minute"- since Sunday. No leg pain. Pain is worse when going from sit to stand and when sitting in certain positions. Pain is better when lying flat. Numeric Pain Rating Scale and Functional Assessment Average Pain 10   In the last MONTH (on 0-10 scale) has pain interfered with the following?  1. General activity like being  able to carry out your everyday physical activities such as walking, climbing stairs, carrying groceries, or moving a chair?  Rating(9)

## 2020-08-08 ENCOUNTER — Encounter: Payer: Self-pay | Admitting: Physical Medicine and Rehabilitation

## 2020-08-09 ENCOUNTER — Ambulatory Visit: Payer: Medicare Other | Admitting: Physical Medicine and Rehabilitation

## 2020-08-17 ENCOUNTER — Other Ambulatory Visit: Payer: Self-pay | Admitting: Internal Medicine

## 2020-09-03 ENCOUNTER — Ambulatory Visit
Admission: RE | Admit: 2020-09-03 | Discharge: 2020-09-03 | Disposition: A | Discharge status: Home / Self Care | Payer: Medicare Other | Source: Ambulatory Visit | Attending: Physical Medicine and Rehabilitation | Admitting: Physical Medicine and Rehabilitation

## 2020-09-03 ENCOUNTER — Other Ambulatory Visit: Payer: Self-pay

## 2020-09-03 DIAGNOSIS — N3001 Acute cystitis with hematuria: Secondary | ICD-10-CM

## 2020-09-15 ENCOUNTER — Other Ambulatory Visit: Payer: Self-pay

## 2020-09-15 ENCOUNTER — Ambulatory Visit (INDEPENDENT_AMBULATORY_CARE_PROVIDER_SITE_OTHER): Payer: Medicare Other | Admitting: Internal Medicine

## 2020-09-15 VITALS — BP 180/84 | HR 63 | Ht 59.0 in | Wt 125.4 lb

## 2020-09-15 DIAGNOSIS — R55 Syncope and collapse: Secondary | ICD-10-CM | POA: Diagnosis not present

## 2020-09-15 DIAGNOSIS — R001 Bradycardia, unspecified: Secondary | ICD-10-CM | POA: Diagnosis not present

## 2020-09-15 DIAGNOSIS — Z950 Presence of cardiac pacemaker: Secondary | ICD-10-CM

## 2020-09-15 DIAGNOSIS — I10 Essential (primary) hypertension: Secondary | ICD-10-CM | POA: Diagnosis not present

## 2020-09-15 MED ORDER — AMLODIPINE BESYLATE 5 MG PO TABS: 5.0000 mg | ORAL_TABLET | Freq: Two times a day (BID) | ORAL | 3 refills | Status: DC

## 2020-09-15 MED ORDER — CARVEDILOL 3.125 MG PO TABS: 3.1250 mg | ORAL_TABLET | Freq: Two times a day (BID) | ORAL | 3 refills | Status: DC

## 2020-09-15 NOTE — Patient Instructions (Addendum)
Medication Instructions:  Your physician has recommended you make the following change in your medication:    START taking carvedilol 3.125 mg- Take one tablet by mouth twice a day  Labwork: None ordered.  Testing/Procedures: None ordered.  Follow-Up:  Your physician wants you to follow-up in: 6 months with Dr. Harrington Challenger.     Your physician wants you to follow-up in: one year with Carol Peru, MD or one of the following Advanced Practice Providers on your designated Care Team:   Carol Bright, Carol Bright, Carol   Any Other Special Instructions Will Be Listed Below (If Applicable).  If you need a refill on your cardiac medications before your next appointment, please call your pharmacy.   Carvedilol Tablets What is this medication? CARVEDILOL (KAR ve dil ol) treats high blood pressure and heart failure. It may also be used to prevent further damage after a heart attack. It works by lowering your blood pressure and heart rate, making it easier for your heart to pump blood to the rest of your body. It belongs to a group of medicationscalled beta blockers. This medicine may be used for other purposes; ask your health care provider orpharmacist if you have questions. COMMON BRAND NAME(S): Coreg What should I tell my care team before I take this medication? They need to know if you have any of these conditions: Circulation problems Diabetes History of heart attack or heart disease Liver disease Lung or breathing disease, like asthma Pheochromocytoma Slow or irregular heartbeat Thyroid disease An unusual or allergic reaction to carvedilol, other beta blockers, medications, foods, dyes, or preservatives Pregnant or trying to get pregnant Breast-feeding How should I use this medication? Take this medication by mouth. Take it as directed on the prescription label at the same time every day. Take it with food. Keep taking it unless your careteam tells you to stop. Talk to  your care team about the use of this medication in children. Specialcare may be needed. Overdosage: If you think you have taken too much of this medicine contact apoison control center or emergency room at once. NOTE: This medicine is only for you. Do not share this medicine with others. What if I miss a dose? If you miss a dose, take it as soon as you can. If it is almost time for yournext dose, take only that dose. Do not take double or extra doses. What may interact with this medication? This medication may interact with the following: Certain medications for blood pressure, heart disease, irregular heartbeat Certain medications for depression, like fluoxetine or paroxetine Certain medications for diabetes, like glipizide or glyburide Cimetidine Clonidine Cyclosporine Digoxin MAOIs like Carbex, Eldepryl, Marplan, Nardil, and Parnate Reserpine Rifampin This list may not describe all possible interactions. Give your health care provider a list of all the medicines, herbs, non-prescription drugs, or dietary supplements you use. Also tell them if you smoke, drink alcohol, or use illegaldrugs. Some items may interact with your medicine. What should I watch for while using this medication? Visit your care team for regular checks on your progress. Check your blood pressure as directed. Ask your care team what your blood pressure should be.Also, find out when you should contact them. Do not treat yourself for coughs, colds, or pain while you are using this medication without asking your care team for advice. Some medications mayincrease your blood pressure. You may get drowsy or dizzy. Do not drive, use machinery, or do anything that needs mental alertness until you know how  this medication affects you. Do not stand up or sit up quickly, especially if you are an older patient. This reduces the risk of dizzy or fainting spells. Alcohol may interfere with theeffect of this medication. Avoid alcoholic  drinks. This medication may increase blood sugar. Ask your care team if changes in dietor medications are needed if you have diabetes. If you are going to need surgery or another procedure, tell your care team thatyou are using this medication. What side effects may I notice from receiving this medication? Side effects that you should report to your care team as soon as possible: Allergic reactions-skin rash, itching, hives, swelling of the face, lips, tongue, or throat Heart failure-shortness of breath, swelling of the ankles, feet, or hands, sudden weight gain, unusual weakness or fatigue Low blood pressure-dizziness, feeling faint or lightheaded, blurry vision Raynaud's-cool, numb, or painful fingers or toes that may change color from pale, to blue, to red Slow heartbeat-dizziness, feeling faint or lightheaded, confusion, trouble breathing, unusual weakness or fatigue Worsening mood, feelings of depression Side effects that usually do not require medical attention (report to your careteam if they continue or are bothersome): Change in sex drive or performance Diarrhea Dizziness Fatigue Headache This list may not describe all possible side effects. Call your doctor for medical advice about side effects. You may report side effects to FDA at1-800-FDA-1088. Where should I keep my medication? Keep out of the reach of children and pets. Store at room temperature between 20 and 25 degrees C (68 and 77 degrees F). Protect from moisture. Keep the container tightly closed. Throw away any unusedmedication after the expiration date. NOTE: This sheet is a summary. It may not cover all possible information. If you have questions about this medicine, talk to your doctor, pharmacist, orhealth care provider.  2022 [employer]/Gold Bright (2020-02-12 11:31:41)

## 2020-09-15 NOTE — Progress Notes (Signed)
HPI Ms. Raths returns today for followup. She is a patient of Dr. Harrington Challenger'. She has a h/o HTN, sinus node dysfunction s/p PPM insertion, HTN, and denies chest pain or sob. She admits to non-compliance. She has become a bit more sedentary but she still walks with the assistance of a cane. She has not had syncope. She denies peripheral edema. she has been off of her amlodipine. Allergies  Allergen Reactions   Morphine Anaphylaxis   Morphine And Related     Thought she had a stroke after taking this medication. Also couldn't talk.   Penicillins Hives   Penicillin G Rash     Current Outpatient Medications  Medication Sig Dispense Refill   carvedilol (COREG) 3.125 MG tablet Take 1 tablet (3.125 mg total) by mouth 2 (two) times daily. 180 tablet 3   amLODipine (NORVASC) 5 MG tablet Take 1 tablet (5 mg total) by mouth in the morning and at bedtime. 180 tablet 3   cetirizine (ZYRTEC) 10 MG tablet Take 10 mg by mouth daily as needed for allergies (allergies).      famotidine (PEPCID) 10 MG tablet Take 10 mg by mouth 2 (two) times daily.     folic acid (FOLVITE) 1 MG tablet Take 2 mg by mouth daily.     hydrocortisone (ANUSOL-HC) 2.5 % rectal cream Place 1 application rectally at bedtime. 30 g 1   Lidocaine-Menthol (1ST RELIEF SPRAY EX) Apply topically 4 (four) times daily as needed.     methotrexate (RHEUMATREX) 2.5 MG tablet Take 25 mg by mouth once a week. Takes 10 tablets on Sunday. Caution:Chemotherapy. Protect from light.     polyethylene glycol (MIRALAX / GLYCOLAX) packet Take 17 g by mouth every other day.     predniSONE (DELTASONE) 1 MG tablet Take 3 mg by mouth daily.     rosuvastatin (CRESTOR) 10 MG tablet Take 1 tablet (10 mg total) by mouth daily. Please make overdue appt with Dr. Harrington Challenger before anymore refills. Thank you 2nd attempt 15 tablet 0   sulfaSALAzine (AZULFIDINE) 500 MG tablet Take 500 mg by mouth 2 (two) times daily.     timolol (TIMOPTIC) 0.5 % ophthalmic solution Place  1 drop into both eyes 2 (two) times daily.     Vitamin D, Ergocalciferol, (DRISDOL) 1.25 MG (50000 UNIT) CAPS capsule Take 50,000 Units by mouth once a week.     No current facility-administered medications for this visit.     Past Medical History:  Diagnosis Date   Borderline diabetes    Breast cancer ([place]) 1996   BREAST CANCER -LEFT BREAST REMOVED -  1996   Cataract    Bil   Daily headache    "recently" (02/06/2012)   Diverticulosis    Dyspnea on exertion    "off and on for years; just worse recently" (02/06/2012)   External hemorrhoids    GERD (gastroesophageal reflux disease)    Guaiac + stool 2005   "went to Dr. Henrene Pastor; took out polyps" (02/06/2012)   H/O hiatal hernia    History of bronchitis    "occasionally" (02/06/2012)   Hyperlipidemia    Hypertension    Orthostatic lightheadedness    Palpitations    Rheumatoid arthritis(714.0)    PT IS FOLLOWED BY DR Cameron   Sinus bradycardia 02/04/2012   Syncope and collapse 02/04/2012   "first time ever" (02/06/2012)    ROS:   All systems reviewed and negative except as noted in the HPI.  Past Surgical History:  Procedure Laterality Date   CARDIAC CATHETERIZATION  02/05/2012   "first one ever" (02/06/2012)   COLONOSCOPY  11/2011   CYSTECTOMY  1993   Left shoulder   EYE SURGERY Right    LEFT AND RIGHT HEART CATHETERIZATION WITH CORONARY ANGIOGRAM N/A 02/05/2012   Procedure: LEFT AND RIGHT HEART CATHETERIZATION WITH CORONARY ANGIOGRAM;  Surgeon: Sherren Mocha, MD;  Location: [facility] CATH LAB;  Service: Cardiovascular;  Laterality: N/A;   MASTECTOMY  1995   Left   PERMANENT PACEMAKER INSERTION N/A 02/07/2012   Procedure: PERMANENT PACEMAKER INSERTION;  Surgeon: Evans Lance, MD;  Location: [facility] CATH LAB;  Service: Cardiovascular;  Laterality: N/A;   POLYPECTOMY  ~2005; ~2008   TONSILLECTOMY  1950's   VAGINAL HYSTERECTOMY  1970     Family History  Problem Relation Age of Onset   Cancer Mother         LEFT BREAST REMOVED   Heart attack Mother    Heart disease Mother    Breast cancer Mother      Social History   Socioeconomic History   Marital status: Widowed    Spouse name: Not on file   Number of children: 3   Years of education: Not on file   Highest education level: Not on file  Occupational History   Occupation: retired  Tobacco Use   Smoking status: Every Day    Packs/day: 0.50    Years: 50.00    Pack years: 25.00    Types: Cigarettes   Smokeless tobacco: Never   Tobacco comments:    02/06/2012 'stopped smoking 4-5 times"; offered smoking cessation materials; pt declines  Vaping Use   Vaping Use: Never used  Substance and Sexual Activity   Alcohol use: Yes    Alcohol/week: 0.0 standard drinks    Comment: 02/06/2012 "occasional; "like a holiday"   Drug use: No   Sexual activity: Never  Other Topics Concern   Not on file  Social History Narrative   Not on file   Social Determinants of Health   Financial Resource Strain: Not on file  Food Insecurity: Not on file  Transportation Needs: Not on file  Physical Activity: Not on file  Stress: Not on file  Social Connections: Not on file  Intimate Partner Violence: Not on file     BP (!) 180/84   Pulse 63   Ht '4\' 11"'$  (1.499 m)   Wt 125 lb 6.4 oz (56.9 kg)   SpO2 95%   BMI 25.33 kg/m   Physical Exam:  Well appearing NAD HEENT: Unremarkable Neck:  No JVD, no thyromegally Lymphatics:  No adenopathy Back:  No CVA tenderness Lungs:  Clear with no wheezes HEART:  Regular rate rhythm, no murmurs, no rubs, no clicks Abd:  soft, positive bowel sounds, no organomegally, no rebound, no guarding Ext:  2 plus pulses, no edema, no cyanosis, no clubbing Skin:  No rashes no nodules Neuro:  CN II through XII intact, motor grossly intact  EKG - nsr  DEVICE  Normal device function.  See PaceArt for details.   Assess/Plan:  Uncontrolled atrial fib - the patient's rate is well controlled and she is maintaining  NSR PPM -her [employer] DDD PM is working normally. We will recheck in several months. HTN - her bp is up as she is out of her amlodipine for several days.  Tobacco abuse - ongoing. She is encouraged to stop smoking.  Carleene Overlie Nicholas Trompeter,MD

## 2020-09-16 ENCOUNTER — Other Ambulatory Visit: Payer: Self-pay | Admitting: Internal Medicine

## 2020-09-16 ENCOUNTER — Other Ambulatory Visit: Payer: Self-pay | Admitting: Physical Medicine and Rehabilitation

## 2020-09-16 ENCOUNTER — Telehealth: Payer: Self-pay | Admitting: Physical Medicine and Rehabilitation

## 2020-09-16 NOTE — Telephone Encounter (Signed)
Please advise 

## 2020-09-16 NOTE — Telephone Encounter (Signed)
Please find out why patient is requesting refill of antibiotic. If she is having urinary symptoms, she will need to follow-up with primary care provider. Thanks!

## 2020-09-16 NOTE — Telephone Encounter (Signed)
Pt calling saying she could not understand the message that was left for her. Pt stated her cell phone has been messing up and the best call back number is [phone].

## 2020-09-16 NOTE — Telephone Encounter (Signed)
Called patient and scheduled for OV on 8/26.

## 2020-09-16 NOTE — Telephone Encounter (Signed)
Left message #1

## 2020-09-17 ENCOUNTER — Ambulatory Visit: Payer: Medicare Other | Admitting: Physical Medicine and Rehabilitation

## 2020-09-17 ENCOUNTER — Other Ambulatory Visit: Payer: Self-pay

## 2020-09-17 ENCOUNTER — Encounter: Payer: Self-pay | Admitting: Physical Medicine and Rehabilitation

## 2020-09-17 VITALS — BP 145/72 | HR 66

## 2020-09-17 DIAGNOSIS — G8929 Other chronic pain: Secondary | ICD-10-CM

## 2020-09-17 DIAGNOSIS — M4306 Spondylolysis, lumbar region: Secondary | ICD-10-CM | POA: Diagnosis not present

## 2020-09-17 DIAGNOSIS — M545 Low back pain, unspecified: Secondary | ICD-10-CM

## 2020-09-17 DIAGNOSIS — M48061 Spinal stenosis, lumbar region without neurogenic claudication: Secondary | ICD-10-CM

## 2020-09-17 DIAGNOSIS — M7918 Myalgia, other site: Secondary | ICD-10-CM

## 2020-09-17 NOTE — Progress Notes (Signed)
brefFeels like back is better some days. Pain when going from sit to stand. Pain across low back.  Numeric Pain Rating Scale and Functional Assessment Average Pain 8   In the last MONTH (on 0-10 scale) has pain interfered with the following?  1. General activity like being  able to carry out your everyday physical activities such as walking, climbing stairs, carrying groceries, or moving a chair?  Rating(2)

## 2020-09-19 ENCOUNTER — Encounter: Payer: Self-pay | Admitting: Physical Medicine and Rehabilitation

## 2020-09-19 NOTE — Progress Notes (Signed)
Carol Bright - 87 y.o. female MRN [id number]  Date of birth: Jan 14, 1931  Office Visit Note: Visit Date: 09/17/2020 PCP: Leanna Battles, MD Referred by: Leanna Battles, MD  Subjective: Chief Complaint  Patient presents with   Lower Back - Pain   HPI: Carol Bright is a 87 y.o. female who comes in today For evaluation of chronic, worsening and severe diffuse back pain, more severe to bilateral lower back. Patient reports she has pain all over her back but she feels the most severe pain is to her bilateral lower back.  Patient states this pain has been chronic for several years.  Patient reports her pain is exacerbated by moving from a sitting to standing position and activity.  Patient describes pain as soreness and tightness sensation to entire back, and currently rates it as 8 out of 10.  Patient reports some pain relief with topical medications and Tylenol.  Patient's recent CT of the abdomen and pelvis exhibits moderate to severe degenerative changes in the spine.  Patient had right L3-L4 interlaminar epidural steroid injection in October 2020 which she reports significantly helped to relieve her pain.  Patient was recently treated for acute cystitis and reports that she does feel better and currently denies any urinary symptoms.  Patient denies focal weakness, numbness and tingling.  Patient denies recent trauma or falls.  Patient denies bowel and bladder incontinence.  Patient's course is complicated by coronary artery disease, sinus bradycardia with pacemaker placement, hypertension and TIA.  Patient is currently being managed by Dr. [name] with Cardiology and was recently started on Carvedilol.  Patient is also being treated by Dr. Lahoma Rocker with Rheumatology.  Patient currently ambulates with a cane.  Review of Systems  Musculoskeletal:  Positive for back pain and myalgias.  Neurological:  Negative for tingling, sensory change, focal weakness and weakness.  All other  systems reviewed and are negative. Otherwise per HPI.  Assessment & Plan: Visit Diagnoses:    ICD-10-CM   1. Chronic bilateral low back pain, unspecified whether sciatica present  M54.50 Ambulatory referral to Physical Therapy   G89.29     2. Spondylolysis, lumbar region  M43.06 Ambulatory referral to Physical Therapy    3. Spinal stenosis of lumbar region without neurogenic claudication  M48.061     4. Myofascial pain syndrome  M79.18        Plan: Findings:  Chronic, worsening and severe diffuse back pain, more severe to bilateral lower back.  Patient's clinical presentation and exam today are consistent with myofascial pain.  We believe the next step is to refer the patient for physical therapy.  Referral placed for in-house physical therapy today.  Patient voiced concerns today about issues with transportation to and from appointments.  We were able to consult case management and will work with patient to arrange transportation.  Patient encouraged to continue conservative therapies at home and to also increase her dose of Tylenol to 500 mg by mouth 4 times a day.  We spoke with patient about other treatment options and at this time she would like to hold off on receiving an epidural injection until after she has completed physical therapy.  We also spoke with patient regarding follow-up with rheumatology and encouraged her to inquire about diffuse pain during her next visit.  No red flag symptoms noted upon exam today.   Meds & Orders: No orders of the defined types were placed in this encounter.   Orders Placed This Encounter  Procedures  Ambulatory referral to Physical Therapy    Follow-up: No follow-ups on file.   Procedures: No procedures performed      Clinical History: No specialty comments available.   She reports that she has been smoking cigarettes. She has a 25.00 pack-year smoking history. She has never used smokeless tobacco. No results for input(s): HGBA1C, LABURIC  in the last 8760 hours.  Objective:  VS:  HT:    WT:   BMI:     BP:(!) 145/72  HR:66bpm  TEMP: ( )  RESP:  Physical Exam HENT:     Head: Normocephalic and atraumatic.     Right Ear: Tympanic membrane normal.     Left Ear: Tympanic membrane normal.     Nose: Nose normal.     Mouth/Throat:     Mouth: Mucous membranes are dry.  Eyes:     Pupils: Pupils are equal, round, and reactive to light.  Cardiovascular:     Rate and Rhythm: Normal rate.     Pulses: Normal pulses.  Pulmonary:     Effort: Pulmonary effort is normal.  Abdominal:     General: Abdomen is flat. There is no distension.  Musculoskeletal:     Cervical back: Normal range of motion and neck supple.     Comments: Pt is slow to rise from seated position to standing. Good lumbar range of motion. Strong distal strength without clonus, no pain upon palpation of greater trochanters. No pain noted with full ROM of bilateral hips. Sensation intact bilaterally. Walks with cane, gait slow.    Skin:    General: Skin is warm and dry.     Capillary Refill: Capillary refill takes less than 2 seconds.  Neurological:     General: No focal deficit present.     Mental Status: She is alert.  Psychiatric:        Mood and Affect: Mood normal.    Ortho Exam  Imaging: No results found.  Past Medical/Family/Surgical/Social History: Medications & Allergies reviewed per EMR, new medications updated. Patient Active Problem List   Diagnosis Date Noted   Rib pain on left side 03/06/2017   Back pain 12/18/2014   RUQ abdominal pain 07/23/2014   Nausea with vomiting 07/23/2014   CN (constipation) 07/23/2014   Muscle pain 02/10/2014   Dizziness 01/26/2014   TIA (transient ischemic attack) 03/17/2013    Class: Acute   CAD (coronary artery disease) of artery bypass graft 03/17/2013    Class: Chronic   Dysphagia, pharyngoesophageal phase 03/17/2013    Class: Chronic   Altered mental status 03/15/2013    Class: Acute   Pacemaker  05/18/2012   Sinus bradycardia 02/04/2012   Dyspnea on exertion    Orthostatic lightheadedness    Palpitations    Syncope    Chest wall pain 12/27/2010   Dyslipidemia 12/27/2010   HTN (hypertension) 12/27/2010   Tobacco abuse 12/27/2010   Past Medical History:  Diagnosis Date   Borderline diabetes    Breast cancer ([place]) 1996   BREAST CANCER -LEFT BREAST REMOVED -  1996   Cataract    Bil   Daily headache    "recently" (02/06/2012)   Diverticulosis    Dyspnea on exertion    "off and on for years; just worse recently" (02/06/2012)   External hemorrhoids    GERD (gastroesophageal reflux disease)    Guaiac + stool 2005   "went to Dr. Henrene Pastor; took out polyps" (02/06/2012)   H/O hiatal hernia    History of bronchitis    "  occasionally" (02/06/2012)   Hyperlipidemia    Hypertension    Orthostatic lightheadedness    Palpitations    Rheumatoid arthritis(714.0)    PT IS FOLLOWED BY DR Anselm Jungling [facility]   Sinus bradycardia 02/04/2012   Syncope and collapse 02/04/2012   "first time ever" (02/06/2012)   Family History  Problem Relation Age of Onset   Cancer Mother        LEFT BREAST REMOVED   Heart attack Mother    Heart disease Mother    Breast cancer Mother    Past Surgical History:  Procedure Laterality Date   CARDIAC CATHETERIZATION  02/05/2012   "first one ever" (02/06/2012)   COLONOSCOPY  11/2011   CYSTECTOMY  1993   Left shoulder   EYE SURGERY Right    LEFT AND RIGHT HEART CATHETERIZATION WITH CORONARY ANGIOGRAM N/A 02/05/2012   Procedure: LEFT AND RIGHT HEART CATHETERIZATION WITH CORONARY ANGIOGRAM;  Surgeon: Sherren Mocha, MD;  Location: [facility] CATH LAB;  Service: Cardiovascular;  Laterality: N/A;   MASTECTOMY  1995   Left   PERMANENT PACEMAKER INSERTION N/A 02/07/2012   Procedure: PERMANENT PACEMAKER INSERTION;  Surgeon: Evans Lance, MD;  Location: [facility] CATH LAB;  Service: Cardiovascular;  Laterality: N/A;   POLYPECTOMY  ~2005; ~2008   TONSILLECTOMY  1950's    VAGINAL HYSTERECTOMY  1970   Social History   Occupational History   Occupation: retired  Tobacco Use   Smoking status: Every Day    Packs/day: 0.50    Years: 50.00    Pack years: 25.00    Types: Cigarettes   Smokeless tobacco: Never   Tobacco comments:    02/06/2012 'stopped smoking 4-5 times"; offered smoking cessation materials; pt declines  Vaping Use   Vaping Use: Never used  Substance and Sexual Activity   Alcohol use: Yes    Alcohol/week: 0.0 standard drinks    Comment: 02/06/2012 "occasional; "like a holiday"   Drug use: No   Sexual activity: Never

## 2020-10-05 ENCOUNTER — Encounter: Payer: Self-pay | Admitting: Physical Therapy

## 2020-10-05 ENCOUNTER — Other Ambulatory Visit: Payer: Self-pay

## 2020-10-05 ENCOUNTER — Ambulatory Visit (INDEPENDENT_AMBULATORY_CARE_PROVIDER_SITE_OTHER): Payer: Medicare Other | Admitting: Physical Therapy

## 2020-10-05 DIAGNOSIS — R262 Difficulty in walking, not elsewhere classified: Secondary | ICD-10-CM | POA: Diagnosis not present

## 2020-10-05 DIAGNOSIS — M545 Low back pain, unspecified: Secondary | ICD-10-CM | POA: Diagnosis not present

## 2020-10-05 DIAGNOSIS — G8929 Other chronic pain: Secondary | ICD-10-CM

## 2020-10-05 DIAGNOSIS — M6281 Muscle weakness (generalized): Secondary | ICD-10-CM | POA: Diagnosis not present

## 2020-10-05 NOTE — Patient Instructions (Signed)
Access Code: [email or web address] URL: [email or web address] Date: 10/05/2020 Prepared by: Kearney Hard  Exercises Supine Lower Trunk Rotation - 2 x daily - 7 x weekly - 3 reps - 10 seconds hold Supine Posterior Pelvic Tilt - 2 x daily - 7 x weekly - 10 reps - 5 seconds hold Supine March - 2 x daily - 7 x weekly - 10 reps Seated Transversus Abdominis Bracing - 2 x daily - 7 x weekly - 10 reps - 5 seconds hold

## 2020-10-05 NOTE — Therapy (Signed)
[facility] [address] [place], [place], [address] Phone: [phone]   Fax:  [phone]  Physical Therapy Evaluation  Patient Details  Name: Carol Bright MRN: [id number] Date of Birth: [birth year] Referring Provider (PT): Magnus Sinning MD   Encounter Date: 10/05/2020   PT End of Session - 10/05/20 1312     Visit Number 1    Number of Visits 6    Date for PT Re-Evaluation 11/19/20    Authorization Type U[employer]    PT Start Time 1300    PT Stop Time 1340    PT Time Calculation (min) 40 min    Activity Tolerance Patient tolerated treatment well    Behavior During Therapy [facility] for tasks assessed/performed             Past Medical History:  Diagnosis Date   Borderline diabetes    Breast cancer ([place]) 1996   BREAST CANCER -LEFT BREAST REMOVED -  1996   Cataract    Bil   Daily headache    "recently" (02/06/2012)   Diverticulosis    Dyspnea on exertion    "off and on for years; just worse recently" (02/06/2012)   External hemorrhoids    GERD (gastroesophageal reflux disease)    Guaiac + stool 2005   "went to Dr. Henrene Pastor; took out polyps" (02/06/2012)   H/O hiatal hernia    History of bronchitis    "occasionally" (02/06/2012)   Hyperlipidemia    Hypertension    Orthostatic lightheadedness    Palpitations    Rheumatoid arthritis(714.0)    PT IS FOLLOWED BY DR [place]   Sinus bradycardia 02/04/2012   Syncope and collapse 02/04/2012   "first time ever" (02/06/2012)    Past Surgical History:  Procedure Laterality Date   CARDIAC CATHETERIZATION  02/05/2012   "first one ever" (02/06/2012)   COLONOSCOPY  11/2011   CYSTECTOMY  1993   Left shoulder   EYE SURGERY Right    LEFT AND RIGHT HEART CATHETERIZATION WITH CORONARY ANGIOGRAM N/A 02/05/2012   Procedure: LEFT AND RIGHT HEART CATHETERIZATION WITH CORONARY ANGIOGRAM;  Surgeon: Sherren Mocha, MD;  Location: [facility] CATH LAB;  Service: Cardiovascular;  Laterality: N/A;    MASTECTOMY  1995   Left   PERMANENT PACEMAKER INSERTION N/A 02/07/2012   Procedure: PERMANENT PACEMAKER INSERTION;  Surgeon: Evans Lance, MD;  Location: [facility] CATH LAB;  Service: Cardiovascular;  Laterality: N/A;   POLYPECTOMY  ~2005; ~2008   TONSILLECTOMY  1950's   VAGINAL HYSTERECTOMY  1970    There were no vitals filed for this visit.    Subjective Assessment - 10/05/20 1305     Subjective Pt arriving to therpay reporting long histotry of low back pain which extends up and down her spine. Pt reporting 6/10 pain at present in low back. Pt reporting difficulty getting out of bed each morning. Pt lives alone and states it's diffcult picking up objects off the floor are difficult. Pt even reporting some difficulty donning her shirts. Pt reporting an injection helped 2 years ago.    Pertinent History CAD pacemaker, HTN, TIA, HTN, treated by Rheumatology    Limitations House hold activities;Lifting;Other (comment)    Diagnostic tests Images: moderate to severe degenerative changes L3-4 interlaminar epidural injection Oct 2020    Currently in Pain? Yes    Pain Score 6     Pain Location Back    Pain Orientation Lower    Pain Descriptors / Indicators Aching;Sore    Pain Type  Chronic pain    Pain Onset More than a month ago    Pain Frequency Intermittent    Aggravating Factors  bending. lifting, reaching, waking up in the morning    Pain Relieving Factors over the counter meds, prescription predisone    Effect of Pain on Daily Activities difficlutly with the following: bending, dressing, picking up objects, getting out of bed                [facility] PT Assessment - 10/05/20 0001       Assessment   Medical Diagnosis M54.5 chronic low back pain, M43.06 spondylolysis lumbar region    Referring Provider (PT) Magnus Sinning MD    Hand Dominance Right    Prior Therapy no      Precautions   Precautions None      Restrictions   Weight Bearing Restrictions No      Balance Screen    Has the patient fallen in the past 6 months No      [place] residence    [place] to enter    Entrance Stairs-Number of Steps 3    Entrance Stairs-Rails Left      Prior Function   Level of [place] Retired    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, early years/pre    Leisure watch t.v      Cognition   Overall Cognitive Status Within Functional Limits for tasks assessed      Posture/Postural Control   Posture/Postural Control Postural limitations    Postural Limitations Rounded Shoulders;Forward head;Increased lumbar lordosis      ROM / Strength   AROM / PROM / Strength AROM;Strength      AROM   Overall AROM  Deficits      Strength   Strength Assessment Site Knee;Hip    Right/Left Hip Right;Left    Right/Left Knee Right;Left    Right Knee Flexion 5/5    Right Knee Extension 5/5    Left Knee Flexion 5/5    Left Knee Extension 5/5      Palpation   Palpation comment TTP: lumbar paraspinals and lower throaic paraspinals      Transfers   Comments 10 seconds for single sit to stand with UE support      Ambulation/Gait   Gait Comments Pt amb with a straight cane with step through gait pattern with decreased step length and cadance. Pt's cane was adjusted to proper height.      Standardized Balance Assessment   Standardized Balance Assessment Timed Up and Go Test      Timed Up and Go Test   TUG Normal TUG    Normal TUG (seconds) 45    TUG Comments using st cane                        Objective measurements completed on examination: See above findings.       [facility] Adult PT Treatment/Exercise - 10/05/20 0001       Exercises   Exercises Lumbar      Lumbar Exercises: Stretches   Lower Trunk Rotation 2 reps;10 seconds    Other Lumbar Stretch Exercise PPT: x 5 holding 5 seconds      Lumbar Exercises: Seated   Other  Seated Lumbar Exercises ab sets x 5 holding 10 seconds    Other Seated Lumbar Exercises  seated marching x 10      Lumbar Exercises: Supine   Bridge Limitations unable to perform bridge                     PT Education - 10/05/20 1312     Education Details PT POC, HEP    Person(s) Educated Patient    Methods Explanation;Demonstration;Handout;Verbal cues;Tactile cues    Comprehension Returned demonstration;Verbalized understanding              PT Short Term Goals - 10/05/20 1314       PT SHORT TERM GOAL #1   Title Pt will be independent in her initial HEP    Time 3    Period Weeks    Status New    Target Date 10/29/20               PT Long Term Goals - 10/05/20 1618       PT LONG TERM GOAL #1   Title Pt will be independent in her advanced HEP.    Time 6    Period Weeks    Status New    Target Date 11/19/20      PT LONG TERM GOAL #2   Title Pt will be able to perform sit to stand using bilateral UE support with pain </= 3/10 in her low back.    Time 6    Period Weeks    Status New    Target Date 11/19/20      PT LONG TERM GOAL #3   Title Pt will be able to improve her TUG to </= 30 seconds using assistive device.    Time 6    Period Weeks    Status New    Target Date 11/19/20      PT LONG TERM GOAL #4   Title Pt will be able to report </= 3/10 pain when walking 500 feet with her st cane.    Time 6    Period Weeks    Status New    Target Date 11/19/20      PT LONG TERM GOAL #5   Title pt will be able to navigate up and down curb step with assisitve device safely.    Time 6    Period Weeks    Status New    Target Date 11/19/20      Additional Long Term Goals   Additional Long Term Goals Yes      PT LONG TERM GOAL #6   Title Pt will improve her FOTO score from 60%  to 72%.    Time 6    Period Weeks    Status New    Target Date 11/19/20                    Plan - 10/05/20 1628     Clinical Impression Statement Pt  arriving to therapy today for PT evaluation of her chronic low back pain. Pt amb into clinic with straight cane that was extended too high for pt. Pt's cane was adjusted to pt's height. Images revealed moderate to severe degenerative changes to lumba spine. Pt s/p L3-4 interlaminar epidural injection in October 2020 which pt stated help but over the last two years her pain has returned. Pt presenting today with decreased bed mobility, decreased sit to stand, increased pain and overall decreased strength. Pt reporting chest pain during session when attempting to perform supine knee to chest. Pt stated it happens  all the time. Pt's O2 saturation during visit were 98% and HR was 62-66 bpm.  Pt was issued a HEP. As pt was leaving clinic pt has sudden onset of right tooth pain of 9/10. Pt was assisted to a chair until her pain subsided. Pt stating her pain was from where she had a tooth removed and she gets sharp pain from time to time. Skilled PT needed to address pt's impairments with below interventions. Pt lives alone and will rely on [facility] [employer] to bring her to and from therapy visits.    Personal Factors and Comorbidities Comorbidity 3+    Comorbidities PMH; CAD pacemaker, HTN, TIA, HTN, treated by Rheumatology    Examination-Activity Limitations Bed Mobility;Transfers;Other;Dressing;Lift;Sleep;Stand;Sit    Examination-Participation Restrictions Community Activity;Other    Stability/Clinical Decision Making Stable/Uncomplicated    Clinical Decision Making Moderate    Rehab Potential Fair    PT Frequency 1x / week    PT Duration 6 weeks    PT Treatment/Interventions ADLs/Self Care Home Management;Moist Heat;Balance training;Therapeutic exercise;Therapeutic activities;Functional mobility training;Stair training;Gait training;Neuromuscular re-education;Patient/family education;Passive range of motion;Manual techniques;Dry needling;Taping    PT Next Visit Plan try nustep, sit to stand, bed mobility  log rolling, LE strengthening as pt tolerates    PT Home Exercise Plan Access Code: [email or web address]  URL: [email or web address]  Date: 10/05/2020  Prepared by: Kearney Hard    Exercises  Supine Lower Trunk Rotation - 2 x daily - 7 x weekly - 3 reps - 10 seconds hold  Supine Posterior Pelvic Tilt - 2 x daily - 7 x weekly - 10 reps - 5 seconds hold  Supine March - 2 x daily - 7 x weekly - 10 reps  Seated Transversus Abdominis Bracing - 2 x daily - 7 x weekly - 10 reps - 5 seconds hold    Consulted and Agree with Plan of Care Patient             Patient will benefit from skilled therapeutic intervention in order to improve the following deficits and impairments:  Pain, Decreased strength, Decreased mobility, Decreased balance, Impaired flexibility, Postural dysfunction, Improper body mechanics, Decreased activity tolerance, Decreased endurance, Difficulty walking, Decreased range of motion  Visit Diagnosis: Chronic bilateral low back pain without sciatica  Muscle weakness (generalized)  Difficulty in walking, not elsewhere classified     Problem List Patient Active Problem List   Diagnosis Date Noted   Rib pain on left side 03/06/2017   Back pain 12/18/2014   RUQ abdominal pain 07/23/2014   Nausea with vomiting 07/23/2014   CN (constipation) 07/23/2014   Muscle pain 02/10/2014   Dizziness 01/26/2014   TIA (transient ischemic attack) 03/17/2013    Class: Acute   CAD (coronary artery disease) of artery bypass graft 03/17/2013    Class: Chronic   Dysphagia, pharyngoesophageal phase 03/17/2013    Class: Chronic   Altered mental status 03/15/2013    Class: Acute   Pacemaker 05/18/2012   Sinus bradycardia 02/04/2012   Dyspnea on exertion    Orthostatic lightheadedness    Palpitations    Syncope    Chest wall pain 12/27/2010   Dyslipidemia 12/27/2010   HTN (hypertension) 12/27/2010   Tobacco abuse 12/27/2010    Oretha Caprice, PT, MPT 10/05/2020, 4:40 PM  [facility] [address] [place], [place], [address] Phone: [phone]   Fax:  [phone]  Name: Carol Bright MRN: [id number] Date of Birth: July 18, 1930

## 2020-10-12 ENCOUNTER — Other Ambulatory Visit: Payer: Self-pay

## 2020-10-12 ENCOUNTER — Encounter: Payer: Self-pay | Admitting: Rehabilitative and Restorative Service Providers"

## 2020-10-12 ENCOUNTER — Ambulatory Visit: Payer: Medicare Other | Admitting: Rehabilitative and Restorative Service Providers"

## 2020-10-12 DIAGNOSIS — R262 Difficulty in walking, not elsewhere classified: Secondary | ICD-10-CM | POA: Diagnosis not present

## 2020-10-12 DIAGNOSIS — M6281 Muscle weakness (generalized): Secondary | ICD-10-CM | POA: Diagnosis not present

## 2020-10-12 DIAGNOSIS — M545 Low back pain, unspecified: Secondary | ICD-10-CM

## 2020-10-12 DIAGNOSIS — G8929 Other chronic pain: Secondary | ICD-10-CM | POA: Diagnosis not present

## 2020-10-12 NOTE — Therapy (Addendum)
[facility] [address] [place], [place], [address] Phone: [phone]   Fax:  [phone]  Physical Therapy Treatment  Patient Details  Name: Carol Bright MRN: [id number] Date of Birth: [birth year] Referring Provider (PT): Magnus Sinning MD   Encounter Date: 10/12/2020   PT End of Session - 10/12/20 1116     Visit Number 2    Number of Visits 6    Date for PT Re-Evaluation 11/19/20    Authorization Type UHC    Progress Note Due on Visit 10    PT Start Time 1100    PT Stop Time 1127    PT Time Calculation (min) 27 min    Activity Tolerance Patient limited by pain;Patient limited by fatigue    Behavior During Therapy [facility] for tasks assessed/performed             Past Medical History:  Diagnosis Date   Borderline diabetes    Breast cancer ([place]) 1996   BREAST CANCER -LEFT BREAST REMOVED -  1996   Cataract    Bil   Daily headache    "recently" (02/06/2012)   Diverticulosis    Dyspnea on exertion    "off and on for years; just worse recently" (02/06/2012)   External hemorrhoids    GERD (gastroesophageal reflux disease)    Guaiac + stool 2005   "went to Dr. Henrene Pastor; took out polyps" (02/06/2012)   H/O hiatal hernia    History of bronchitis    "occasionally" (02/06/2012)   Hyperlipidemia    Hypertension    Orthostatic lightheadedness    Palpitations    Rheumatoid arthritis(714.0)    PT IS FOLLOWED BY DR [place]   Sinus bradycardia 02/04/2012   Syncope and collapse 02/04/2012   "first time ever" (02/06/2012)    Past Surgical History:  Procedure Laterality Date   CARDIAC CATHETERIZATION  02/05/2012   "first one ever" (02/06/2012)   COLONOSCOPY  11/2011   CYSTECTOMY  1993   Left shoulder   EYE SURGERY Right    LEFT AND RIGHT HEART CATHETERIZATION WITH CORONARY ANGIOGRAM N/A 02/05/2012   Procedure: LEFT AND RIGHT HEART CATHETERIZATION WITH CORONARY ANGIOGRAM;  Surgeon: Sherren Mocha, MD;  Location: [facility] CATH  LAB;  Service: Cardiovascular;  Laterality: N/A;   MASTECTOMY  1995   Left   PERMANENT PACEMAKER INSERTION N/A 02/07/2012   Procedure: PERMANENT PACEMAKER INSERTION;  Surgeon: Evans Lance, MD;  Location: [facility] CATH LAB;  Service: Cardiovascular;  Laterality: N/A;   POLYPECTOMY  ~2005; ~2008   TONSILLECTOMY  1950's   VAGINAL HYSTERECTOMY  1970    There were no vitals filed for this visit.   Subjective Assessment - 10/12/20 1106     Subjective Pt. reported that she felt tired and sore all over but noted 5/10 pain in back today.  Also reported knee pains as well.  Stiffness was reported all over.    Pertinent History CAD pacemaker, HTN, TIA, HTN, treated by Rheumatology    Limitations House hold activities;Lifting;Other (comment)    Diagnostic tests Images: moderate to severe degenerative changes L3-4 interlaminar epidural injection Oct 2020    Currently in Pain? Yes    Pain Score 5     Pain Location Back    Pain Orientation Lower    Pain Descriptors / Indicators Aching;Sore;Tightness    Pain Type Chronic pain    Pain Onset More than a month ago    Pain Frequency Constant    Aggravating Factors  constant tightness,  sore    Pain Relieving Factors rest                               [facility] Adult PT Treatment/Exercise - 10/12/20 0001       Lumbar Exercises: Stretches   Lower Trunk Rotation 5 reps   15 sec x 5 bilateral     Lumbar Exercises: Supine   Other Supine Lumbar Exercises supine maching c abdominal hold 2 x 10 bilateral leg alternating            Nustep lvl 5 5 mins            PT Short Term Goals - 10/12/20 1116       PT SHORT TERM GOAL #1   Title Pt will be independent in her initial HEP    Time 3    Period Weeks    Status On-going    Target Date 10/29/20               PT Long Term Goals - 10/05/20 1618       PT LONG TERM GOAL #1   Title Pt will be independent in her advanced HEP.    Time 6    Period Weeks    Status New     Target Date 11/19/20      PT LONG TERM GOAL #2   Title Pt will be able to perform sit to stand using bilateral UE support with pain </= 3/10 in her low back.    Time 6    Period Weeks    Status New    Target Date 11/19/20      PT LONG TERM GOAL #3   Title Pt will be able to improve her TUG to </= 30 seconds using assistive device.    Time 6    Period Weeks    Status New    Target Date 11/19/20      PT LONG TERM GOAL #4   Title Pt will be able to report </= 3/10 pain when walking 500 feet with her st cane.    Time 6    Period Weeks    Status New    Target Date 11/19/20      PT LONG TERM GOAL #5   Title pt will be able to navigate up and down curb step with assisitve device safely.    Time 6    Period Weeks    Status New    Target Date 11/19/20      Additional Long Term Goals   Additional Long Term Goals Yes      PT LONG TERM GOAL #6   Title Pt will improve her FOTO score from 60%  to 72%.    Time 6    Period Weeks    Status New    Target Date 11/19/20                   Plan - 10/12/20 1122     Clinical Impression Statement Noted generalized weakness and deconditioning that impacts functional mobility as well as specific lumbar extension mobility restriction that impair posture in standing/walking prolonged.  Pt. may continue to benefit from skilled PT services to aide in lowering symptoms, improving functional and structural mobility.    Personal Factors and Comorbidities Comorbidity 3+    Comorbidities PMH; CAD pacemaker, HTN, TIA, HTN, treated by Rheumatology    Examination-Activity Limitations Bed Mobility;Transfers;Other;Dressing;Lift;Sleep;Stand;Sit  Examination-Participation Restrictions Community Activity;Other    Stability/Clinical Decision Making Stable/Uncomplicated    Rehab Potential Fair    PT Frequency 1x / week    PT Duration 6 weeks    PT Treatment/Interventions ADLs/Self Care Home Management;Moist Heat;Balance training;Therapeutic  exercise;Therapeutic activities;Functional mobility training;Stair training;Gait training;Neuromuscular re-education;Patient/family education;Passive range of motion;Manual techniques;Dry needling;Taping    PT Next Visit Plan General strengthening, endurance.  Lumbar mobilty gains.    PT Home Exercise Plan Access Code: [email or web address]  URL: [email or web address]  Date: 10/05/2020  Prepared by: Kearney Hard    Exercises  Supine Lower Trunk Rotation - 2 x daily - 7 x weekly - 3 reps - 10 seconds hold  Supine Posterior Pelvic Tilt - 2 x daily - 7 x weekly - 10 reps - 5 seconds hold  Supine March - 2 x daily - 7 x weekly - 10 reps  Seated Transversus Abdominis Bracing - 2 x daily - 7 x weekly - 10 reps - 5 seconds hold    Consulted and Agree with Plan of Care Patient             Patient will benefit from skilled therapeutic intervention in order to improve the following deficits and impairments:  Pain, Decreased strength, Decreased mobility, Decreased balance, Impaired flexibility, Postural dysfunction, Improper body mechanics, Decreased activity tolerance, Decreased endurance, Difficulty walking, Decreased range of motion  Visit Diagnosis: Chronic bilateral low back pain without sciatica  Muscle weakness (generalized)  Difficulty in walking, not elsewhere classified     Problem List Patient Active Problem List   Diagnosis Date Noted   Rib pain on left side 03/06/2017   Back pain 12/18/2014   RUQ abdominal pain 07/23/2014   Nausea with vomiting 07/23/2014   CN (constipation) 07/23/2014   Muscle pain 02/10/2014   Dizziness 01/26/2014   TIA (transient ischemic attack) 03/17/2013    Class: Acute   CAD (coronary artery disease) of artery bypass graft 03/17/2013    Class: Chronic   Dysphagia, pharyngoesophageal phase 03/17/2013    Class: Chronic   Altered mental status 03/15/2013    Class: Acute   Pacemaker 05/18/2012   Sinus bradycardia 02/04/2012   Dyspnea on exertion     Orthostatic lightheadedness    Palpitations    Syncope    Chest wall pain 12/27/2010   Dyslipidemia 12/27/2010   HTN (hypertension) 12/27/2010   Tobacco abuse 12/27/2010    Scot Jun, PT, DPT, OCS, ATC 10/12/20  11:27 AM    [facility] [address] [place], [place], [address] Phone: [phone]   Fax:  [phone]  Name: Carol Bright MRN: [id number] Date of Birth: 03-28-1930

## 2020-10-18 ENCOUNTER — Encounter: Payer: Medicare Other | Admitting: Physical Therapy

## 2020-10-19 ENCOUNTER — Encounter: Payer: Self-pay | Admitting: Rehabilitative and Restorative Service Providers"

## 2020-10-19 ENCOUNTER — Other Ambulatory Visit: Payer: Self-pay

## 2020-10-19 ENCOUNTER — Ambulatory Visit (INDEPENDENT_AMBULATORY_CARE_PROVIDER_SITE_OTHER): Payer: Medicare Other | Admitting: Rehabilitative and Restorative Service Providers"

## 2020-10-19 DIAGNOSIS — R262 Difficulty in walking, not elsewhere classified: Secondary | ICD-10-CM

## 2020-10-19 DIAGNOSIS — G8929 Other chronic pain: Secondary | ICD-10-CM

## 2020-10-19 DIAGNOSIS — M6281 Muscle weakness (generalized): Secondary | ICD-10-CM

## 2020-10-19 DIAGNOSIS — M545 Low back pain, unspecified: Secondary | ICD-10-CM

## 2020-10-19 NOTE — Therapy (Signed)
[facility] [address] [place], [place], [address] Phone: [phone]   Fax:  [phone]  Physical Therapy Treatment  Patient Details  Name: Carol Bright MRN: [id number] Date of Birth: 1931-01-20 Referring Provider (PT): Magnus Sinning MD   Encounter Date: 10/19/2020   PT End of Session - 10/19/20 1050     Visit Number 3    Number of Visits 6    Date for PT Re-Evaluation 11/19/20    Authorization Type UHC    Progress Note Due on Visit 10    PT Start Time 1056    PT Stop Time 1123    PT Time Calculation (min) 27 min    Activity Tolerance Patient limited by pain;Patient limited by fatigue    Behavior During Therapy [facility] for tasks assessed/performed             Past Medical History:  Diagnosis Date   Borderline diabetes    Breast cancer ([place]) 1996   BREAST CANCER -LEFT BREAST REMOVED -  1996   Cataract    Bil   Daily headache    "recently" (02/06/2012)   Diverticulosis    Dyspnea on exertion    "off and on for years; just worse recently" (02/06/2012)   External hemorrhoids    GERD (gastroesophageal reflux disease)    Guaiac + stool 2005   "went to Dr. Henrene Pastor; took out polyps" (02/06/2012)   H/O hiatal hernia    History of bronchitis    "occasionally" (02/06/2012)   Hyperlipidemia    Hypertension    Orthostatic lightheadedness    Palpitations    Rheumatoid arthritis(714.0)    PT IS FOLLOWED BY DR [place]   Sinus bradycardia 02/04/2012   Syncope and collapse 02/04/2012   "first time ever" (02/06/2012)    Past Surgical History:  Procedure Laterality Date   CARDIAC CATHETERIZATION  02/05/2012   "first one ever" (02/06/2012)   COLONOSCOPY  11/2011   CYSTECTOMY  1993   Left shoulder   EYE SURGERY Right    LEFT AND RIGHT HEART CATHETERIZATION WITH CORONARY ANGIOGRAM N/A 02/05/2012   Procedure: LEFT AND RIGHT HEART CATHETERIZATION WITH CORONARY ANGIOGRAM;  Surgeon: Sherren Mocha, MD;  Location: [facility] CATH  LAB;  Service: Cardiovascular;  Laterality: N/A;   MASTECTOMY  1995   Left   PERMANENT PACEMAKER INSERTION N/A 02/07/2012   Procedure: PERMANENT PACEMAKER INSERTION;  Surgeon: Evans Lance, MD;  Location: [facility] CATH LAB;  Service: Cardiovascular;  Laterality: N/A;   POLYPECTOMY  ~2005; ~2008   TONSILLECTOMY  1950's   VAGINAL HYSTERECTOMY  1970    There were no vitals filed for this visit.   Subjective Assessment - 10/19/20 1059     Subjective Pt. indicated feeling stiff in knee and back upon arrival today but didn't report any specific pain number for back. She indicated she continued to spend most of her time sitting or in bed.    Pertinent History CAD pacemaker, HTN, TIA, HTN, treated by Rheumatology    Limitations House hold activities;Lifting;Other (comment)    Diagnostic tests Images: moderate to severe degenerative changes L3-4 interlaminar epidural injection Oct 2020    Currently in Pain? 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    Referring Provider (PT) Magnus Sinning MD      Posture/Postural Control   Posture Comments Mild forward trunk lean, rounded shoulders, reduced lumbar lordosis c increased thoracic kyphosis noted in standing/walking                           [facility] Adult PT Treatment/Exercise - 10/19/20 0001       Lumbar Exercises: Stretches   Single Knee to Chest Stretch 5 reps;Left;Right   15 sec x 5 bilateral   Lower Trunk Rotation 5 reps   15 sec x 5 bilateral     Lumbar Exercises: Aerobic   Nustep Lvl 5 5 mins x 2 c rest in middle (no Lt  arm use)      Lumbar Exercises: Supine   Other Supine Lumbar Exercises supine maching c abdominal hold 2 x 10 bilateral leg alternating                       PT Short Term Goals - 10/12/20 1116       PT SHORT TERM GOAL #1   Title Pt will be independent in her initial HEP    Time 3    Period Weeks    Status On-going    Target Date 10/29/20               PT Long Term Goals - 10/05/20 1618       PT LONG TERM GOAL #1   Title Pt will be independent in her advanced HEP.    Time 6    Period Weeks    Status New    Target Date 11/19/20      PT LONG TERM GOAL #2   Title Pt will be able to perform sit to stand using bilateral UE support with pain </= 3/10 in her low back.    Time 6    Period Weeks    Status New    Target Date 11/19/20      PT LONG TERM GOAL #3   Title Pt will be able to improve her TUG to </= 30 seconds using assistive device.    Time 6    Period Weeks    Status New    Target Date 11/19/20      PT LONG TERM GOAL #4   Title Pt will be able to report </= 3/10 pain when walking 500 feet with her st cane.    Time 6    Period Weeks    Status New    Target Date 11/19/20      PT LONG TERM GOAL #5   Title pt will be able to navigate up and down curb step with assisitve device safely.    Time 6    Period Weeks    Status New    Target Date 11/19/20      Additional Long Term Goals   Additional Long Term Goals Yes      PT LONG TERM GOAL #6   Title Pt will improve her FOTO score from 60%  to 72%.    Time 6    Period Weeks    Status New  Target Date 11/19/20                   Plan - 10/19/20 1115     Clinical Impression Statement Continued functional and exercise limitations secondary to general fatigue that has continued to limit progression of activity within clinic.  Fair recall of HEP at this time continued.  Rt lumbar symptoms most evident as restriction in observed mobility in clinic.    Personal Factors and  Comorbidities Comorbidity 3+    Comorbidities PMH; CAD pacemaker, HTN, TIA, HTN, treated by Rheumatology    Examination-Activity Limitations Bed Mobility;Transfers;Other;Dressing;Lift;Sleep;Stand;Sit    Examination-Participation Restrictions Community Activity;Other    Stability/Clinical Decision Making Stable/Uncomplicated    Rehab Potential Fair    PT Frequency 1x / week    PT Duration 6 weeks    PT Treatment/Interventions ADLs/Self Care Home Management;Moist Heat;Balance training;Therapeutic exercise;Therapeutic activities;Functional mobility training;Stair training;Gait training;Neuromuscular re-education;Patient/family education;Passive range of motion;Manual techniques;Dry needling;Taping    PT Next Visit Plan General strengthening, endurance progression to tolerance, attempt full visit length next time if fatigue allows.    PT Home Exercise Plan Access Code: [email or web address]  URL: [email or web address]  Date: 10/05/2020  Prepared by: Kearney Hard    Exercises  Supine Lower Trunk Rotation - 2 x daily - 7 x weekly - 3 reps - 10 seconds hold  Supine Posterior Pelvic Tilt - 2 x daily - 7 x weekly - 10 reps - 5 seconds hold  Supine March - 2 x daily - 7 x weekly - 10 reps  Seated Transversus Abdominis Bracing - 2 x daily - 7 x weekly - 10 reps - 5 seconds hold    Consulted and Agree with Plan of Care Patient             Patient will benefit from skilled therapeutic intervention in order to improve the following deficits and impairments:  Pain, Decreased strength, Decreased mobility, Decreased balance, Impaired flexibility, Postural dysfunction, Improper body mechanics, Decreased activity tolerance, Decreased endurance, Difficulty walking, Decreased range of motion  Visit Diagnosis: Chronic bilateral low back pain without sciatica  Muscle weakness (generalized)  Difficulty in walking, not elsewhere classified     Problem List Patient Active Problem List   Diagnosis Date  Noted   Rib pain on left side 03/06/2017   Back pain 12/18/2014   RUQ abdominal pain 07/23/2014   Nausea with vomiting 07/23/2014   CN (constipation) 07/23/2014   Muscle pain 02/10/2014   Dizziness 01/26/2014   TIA (transient ischemic attack) 03/17/2013    Class: Acute   CAD (coronary artery disease) of artery bypass graft 03/17/2013    Class: Chronic   Dysphagia, pharyngoesophageal phase 03/17/2013    Class: Chronic   Altered mental status 03/15/2013    Class: Acute   Pacemaker 05/18/2012   Sinus bradycardia 02/04/2012   Dyspnea on exertion    Orthostatic lightheadedness    Palpitations    Syncope    Chest wall pain 12/27/2010   Dyslipidemia 12/27/2010   HTN (hypertension) 12/27/2010   Tobacco abuse 12/27/2010   Scot Jun, PT, DPT, OCS, ATC 10/19/20  11:23 AM    [facility] [address] [place], [place], [address] Phone: [phone]   Fax:  [phone]  Name: Carol Bright MRN: [id number] Date of Birth: 1930/10/18

## 2020-10-26 ENCOUNTER — Encounter: Payer: Medicare Other | Admitting: Physical Therapy

## 2020-11-02 ENCOUNTER — Encounter: Payer: Self-pay | Admitting: Physical Therapy

## 2020-11-02 ENCOUNTER — Other Ambulatory Visit: Payer: Self-pay

## 2020-11-02 ENCOUNTER — Ambulatory Visit (INDEPENDENT_AMBULATORY_CARE_PROVIDER_SITE_OTHER): Payer: Medicare Other | Admitting: Physical Therapy

## 2020-11-02 DIAGNOSIS — M6281 Muscle weakness (generalized): Secondary | ICD-10-CM | POA: Diagnosis not present

## 2020-11-02 DIAGNOSIS — G8929 Other chronic pain: Secondary | ICD-10-CM | POA: Diagnosis not present

## 2020-11-02 DIAGNOSIS — M545 Low back pain, unspecified: Secondary | ICD-10-CM

## 2020-11-02 NOTE — Therapy (Signed)
[facility] [address] [place], [place], [address] Phone: [phone]   Fax:  [phone]  Physical Therapy Treatment  Patient Details  Name: Carol Bright MRN: [id number] Date of Birth: [birth year] Referring Provider (PT): Magnus Sinning MD   Encounter Date: 11/02/2020   PT End of Session - 11/02/20 1414     Visit Number 4    Number of Visits 6    Date for PT Re-Evaluation 11/19/20    Authorization Type UHC    Progress Note Due on Visit 10    PT Start Time 0932    PT Stop Time 1420    PT Time Calculation (min) 33 min    Activity Tolerance Patient limited by pain;Patient limited by fatigue    Behavior During Therapy [facility] for tasks assessed/performed             Past Medical History:  Diagnosis Date   Borderline diabetes    Breast cancer ([place]) 1996   BREAST CANCER -LEFT BREAST REMOVED -  1996   Cataract    Bil   Daily headache    "recently" (02/06/2012)   Diverticulosis    Dyspnea on exertion    "off and on for years; just worse recently" (02/06/2012)   External hemorrhoids    GERD (gastroesophageal reflux disease)    Guaiac + stool 2005   "went to Dr. Henrene Pastor; took out polyps" (02/06/2012)   H/O hiatal hernia    History of bronchitis    "occasionally" (02/06/2012)   Hyperlipidemia    Hypertension    Orthostatic lightheadedness    Palpitations    Rheumatoid arthritis(714.0)    PT IS FOLLOWED BY DR [place]   Sinus bradycardia 02/04/2012   Syncope and collapse 02/04/2012   "first time ever" (02/06/2012)    Past Surgical History:  Procedure Laterality Date   CARDIAC CATHETERIZATION  02/05/2012   "first one ever" (02/06/2012)   COLONOSCOPY  11/2011   CYSTECTOMY  1993   Left shoulder   EYE SURGERY Right    LEFT AND RIGHT HEART CATHETERIZATION WITH CORONARY ANGIOGRAM N/A 02/05/2012   Procedure: LEFT AND RIGHT HEART CATHETERIZATION WITH CORONARY ANGIOGRAM;  Surgeon: Sherren Mocha, MD;  Location: [facility] CATH  LAB;  Service: Cardiovascular;  Laterality: N/A;   MASTECTOMY  1995   Left   PERMANENT PACEMAKER INSERTION N/A 02/07/2012   Procedure: PERMANENT PACEMAKER INSERTION;  Surgeon: Evans Lance, MD;  Location: [facility] CATH LAB;  Service: Cardiovascular;  Laterality: N/A;   POLYPECTOMY  ~2005; ~2008   TONSILLECTOMY  1950's   VAGINAL HYSTERECTOMY  1970    There were no vitals filed for this visit.   Subjective Assessment - 11/02/20 1353     Subjective Pt arriving today reporting it not being a great day.    Pertinent History CAD pacemaker, HTN, TIA, HTN, treated by Rheumatology    Limitations House hold activities;Lifting;Other (comment)    Diagnostic tests Images: moderate to severe degenerative changes L3-4 interlaminar epidural injection Oct 2020    Currently in Pain? Yes    Pain Score 6     Pain Location Back    Pain Orientation Lower    Pain Descriptors / Indicators Tightness;Aching    Pain Type Chronic pain    Pain Onset More than a month ago                               [facility] Adult PT Treatment/Exercise -  11/02/20 0001       Lumbar Exercises: Seated   Sit to Stand 10 reps    Other Seated Lumbar Exercises marching x 20 total, instructed in abdominal contraction during activity    Other Seated Lumbar Exercises ball squeezes x 10 holding 5 seconds with repeated verbal cues, rows using green theraband 2x10      Lumbar Exercises: Supine   Bridge Limitations limited lift off x 10 unable to hold    Straight Leg Raise 5 reps;2 seconds      Lumbar Exercises: Sidelying   Clam 10 reps                       PT Short Term Goals - 11/02/20 1420       PT SHORT TERM GOAL #1   Title Pt will be independent in her initial HEP    Status On-going               PT Long Term Goals - 11/02/20 1420       PT LONG TERM GOAL #1   Title Pt will be independent in her advanced HEP.    Status On-going      PT LONG TERM GOAL #2   Title Pt will be able  to perform sit to stand using bilateral UE support with pain </= 3/10 in her low back.    Status On-going      PT LONG TERM GOAL #3   Title Pt will be able to improve her TUG to </= 30 seconds using assistive device.      PT LONG TERM GOAL #4   Title Pt will be able to report </= 3/10 pain when walking 500 feet with her st cane.    Status On-going      PT LONG TERM GOAL #5   Title pt will be able to navigate up and down curb step with assisitve device safely.    Status On-going      PT LONG TERM GOAL #6   Title Pt will improve her FOTO score from 60%  to 72%.    Status On-going                   Plan - 11/02/20 1415     Clinical Impression Statement Pt reporting upon arrival that today was a "not so good day". Pt reporting fatigue thoughout session. Pt also complaining of right thigh pain with exercises. Pt needed constant verbal and tactile cues for all exercies. Pt still with limited lumbar mobility. HEP verbally reviwed with pt. Shortened session due to pt's fatigue. Continue skilled PT to maximize funciton.    Personal Factors and Comorbidities Comorbidity 3+    Comorbidities PMH; CAD pacemaker, HTN, TIA, HTN, treated by Rheumatology    Examination-Activity Limitations Bed Mobility;Transfers;Other;Dressing;Lift;Sleep;Stand;Sit    Examination-Participation Restrictions Community Activity;Other    Stability/Clinical Decision Making Stable/Uncomplicated    Rehab Potential Fair    PT Frequency 1x / week    PT Duration 6 weeks    PT Treatment/Interventions ADLs/Self Care Home Management;Moist Heat;Balance training;Therapeutic exercise;Therapeutic activities;Functional mobility training;Stair training;Gait training;Neuromuscular re-education;Patient/family education;Passive range of motion;Manual techniques;Dry needling;Taping    PT Next Visit Plan General strengthening, endurance progression to tolerance    PT Home Exercise Plan Access Code: [email or web address]  URL:  [email or web address]  Date: 10/05/2020  Prepared by: Kearney Hard    Exercises  Supine Lower Trunk Rotation - 2 x daily - 7 x weekly - 3 reps -  10 seconds hold  Supine Posterior Pelvic Tilt - 2 x daily - 7 x weekly - 10 reps - 5 seconds hold  Supine March - 2 x daily - 7 x weekly - 10 reps  Seated Transversus Abdominis Bracing - 2 x daily - 7 x weekly - 10 reps - 5 seconds hold    Consulted and Agree with Plan of Care Patient             Patient will benefit from skilled therapeutic intervention in order to improve the following deficits and impairments:  Pain, Decreased strength, Decreased mobility, Decreased balance, Impaired flexibility, Postural dysfunction, Improper body mechanics, Decreased activity tolerance, Decreased endurance, Difficulty walking, Decreased range of motion  Visit Diagnosis: Chronic bilateral low back pain without sciatica  Muscle weakness (generalized)     Problem List Patient Active Problem List   Diagnosis Date Noted   Rib pain on left side 03/06/2017   Back pain 12/18/2014   RUQ abdominal pain 07/23/2014   Nausea with vomiting 07/23/2014   CN (constipation) 07/23/2014   Muscle pain 02/10/2014   Dizziness 01/26/2014   TIA (transient ischemic attack) 03/17/2013    Class: Acute   CAD (coronary artery disease) of artery bypass graft 03/17/2013    Class: Chronic   Dysphagia, pharyngoesophageal phase 03/17/2013    Class: Chronic   Altered mental status 03/15/2013    Class: Acute   Pacemaker 05/18/2012   Sinus bradycardia 02/04/2012   Dyspnea on exertion    Orthostatic lightheadedness    Palpitations    Syncope    Chest wall pain 12/27/2010   Dyslipidemia 12/27/2010   HTN (hypertension) 12/27/2010   Tobacco abuse 12/27/2010    Oretha Caprice, PT, MPT 11/02/2020, 2:23 PM  [facility] [address] [place], [place], [address] Phone: [phone]   Fax:  [phone]  Name: JONNETTE NUON MRN: [id number] Date of Birth: 01-04-1931

## 2020-11-09 ENCOUNTER — Other Ambulatory Visit: Payer: Self-pay

## 2020-11-09 ENCOUNTER — Ambulatory Visit (INDEPENDENT_AMBULATORY_CARE_PROVIDER_SITE_OTHER): Payer: Medicare Other | Admitting: Physical Therapy

## 2020-11-09 ENCOUNTER — Encounter: Payer: Self-pay | Admitting: Physical Therapy

## 2020-11-09 DIAGNOSIS — M545 Low back pain, unspecified: Secondary | ICD-10-CM

## 2020-11-09 DIAGNOSIS — G8929 Other chronic pain: Secondary | ICD-10-CM | POA: Diagnosis not present

## 2020-11-09 DIAGNOSIS — M6281 Muscle weakness (generalized): Secondary | ICD-10-CM

## 2020-11-09 NOTE — Therapy (Signed)
[facility] [address] [place], [place], [address] Phone: [phone]   Fax:  [phone]  Physical Therapy Treatment  Patient Details  Name: Carol Bright MRN: [id number] Date of Birth: 1930-10-20 Referring Provider (PT): Magnus Sinning MD   Encounter Date: 11/09/2020   PT End of Session - 11/09/20 1407     Visit Number 5    Number of Visits 6    Date for PT Re-Evaluation 11/19/20    Authorization Type UHC    Progress Note Due on Visit 10    PT Start Time 7591    PT Stop Time 1423    PT Time Calculation (min) 38 min    Activity Tolerance Patient limited by pain;Patient limited by fatigue    Behavior During Therapy [facility] for tasks assessed/performed             Past Medical History:  Diagnosis Date   Borderline diabetes    Breast cancer ([place]) 1996   BREAST CANCER -LEFT BREAST REMOVED -  1996   Cataract    Bil   Daily headache    "recently" (02/06/2012)   Diverticulosis    Dyspnea on exertion    "off and on for years; just worse recently" (02/06/2012)   External hemorrhoids    GERD (gastroesophageal reflux disease)    Guaiac + stool 2005   "went to Dr. Henrene Pastor; took out polyps" (02/06/2012)   H/O hiatal hernia    History of bronchitis    "occasionally" (02/06/2012)   Hyperlipidemia    Hypertension    Orthostatic lightheadedness    Palpitations    Rheumatoid arthritis(714.0)    PT IS FOLLOWED BY DR [place]   Sinus bradycardia 02/04/2012   Syncope and collapse 02/04/2012   "first time ever" (02/06/2012)    Past Surgical History:  Procedure Laterality Date   CARDIAC CATHETERIZATION  02/05/2012   "first one ever" (02/06/2012)   COLONOSCOPY  11/2011   CYSTECTOMY  1993   Left shoulder   EYE SURGERY Right    LEFT AND RIGHT HEART CATHETERIZATION WITH CORONARY ANGIOGRAM N/A 02/05/2012   Procedure: LEFT AND RIGHT HEART CATHETERIZATION WITH CORONARY ANGIOGRAM;  Surgeon: Sherren Mocha, MD;  Location: [facility] CATH  LAB;  Service: Cardiovascular;  Laterality: N/A;   MASTECTOMY  1995   Left   PERMANENT PACEMAKER INSERTION N/A 02/07/2012   Procedure: PERMANENT PACEMAKER INSERTION;  Surgeon: Evans Lance, MD;  Location: [facility] CATH LAB;  Service: Cardiovascular;  Laterality: N/A;   POLYPECTOMY  ~2005; ~2008   TONSILLECTOMY  1950's   VAGINAL HYSTERECTOMY  1970    There were no vitals filed for this visit.   Subjective Assessment - 11/09/20 1407     Subjective pt stating today she is tired    Pertinent History CAD pacemaker, HTN, TIA, HTN, treated by Rheumatology    Limitations House hold activities;Lifting;Other (comment)    Diagnostic tests Images: moderate to severe degenerative changes L3-4 interlaminar epidural injection Oct 2020    Currently in Pain? Yes    Pain Score 5     Pain Location Back    Pain Orientation Lower    Pain Onset More than a month ago                               [facility] Adult PT Treatment/Exercise - 11/09/20 0001       Lumbar Exercises: Stretches   Single Knee to Chest Stretch  3 reps;20 seconds    Lower Trunk Rotation 4 reps;20 seconds      Lumbar Exercises: Seated   Long Arc Quad on Chair Strengthening;Both;10 reps    LAQ on Chair Weights (lbs) 2#    Sit to Stand 10 reps    Other Seated Lumbar Exercises marching x 20 total, instructed in abdominal contraction during activity    Other Seated Lumbar Exercises clam shells using green theraband 2x10      Lumbar Exercises: Supine   Bridge Limitations limited lift off x 10 unable to hold                       PT Short Term Goals - 11/09/20 1409       PT SHORT TERM GOAL #1   Title Pt will be independent in her initial HEP    Status Achieved               PT Long Term Goals - 11/09/20 1409       PT LONG TERM GOAL #1   Title Pt will be independent in her advanced HEP.    Status On-going      PT LONG TERM GOAL #2   Title Pt will be able to perform sit to stand using  bilateral UE support with pain </= 3/10 in her low back.    Baseline Pt stating most days she can perform sit to stand with pain </= 3/10.    Status Achieved      PT LONG TERM GOAL #3   Title Pt will be able to improve her TUG to </= 30 seconds using assistive device.    Baseline 30 seconds using straight cane on 11/09/2020.    Status Achieved      PT LONG TERM GOAL #4   Title Pt will be able to report </= 3/10 pain when walking 500 feet with her st cane.    Baseline Pt reproting LE fatigue with walking longer distances.    Status On-going      PT LONG TERM GOAL #5   Title pt will be able to navigate up and down curb step with assisitve device safely.    Baseline unable to perform on 11/09/2020 without UE suppor bilaterally    Status On-going      PT LONG TERM GOAL #6   Title Pt will improve her FOTO score from 60%  to 72%.    Baseline Pt's FOTO dropped from 60% to 42% on 11/09/2020.    Status Not Met                   Plan - 11/09/20 1426     Clinical Impression Statement Pt arriving to therapy again reporting increased fatigue. Pt's FOTO score has dropped from 60% to 42% however pt reproting her back pain is much better and feels she can hold off on getting another injection for now. Pt was able to complete her TUG in 30 seconds using her straight cane. Pt unable to perform step up on curb without assistance. Pt tolerating exercises with rest breaks given as needed. We discussed discharge at next visit with review of pt's HEP.    Personal Factors and Comorbidities Comorbidity 3+    Comorbidities PMH; CAD pacemaker, HTN, TIA, HTN, treated by Rheumatology    Examination-Activity Limitations Bed Mobility;Transfers;Other;Dressing;Lift;Sleep;Stand;Sit    Examination-Participation Restrictions Community Activity;Other    Stability/Clinical Decision Making Stable/Uncomplicated    Rehab Potential Fair  PT Frequency 1x / week    PT Duration 6 weeks    PT  Treatment/Interventions ADLs/Self Care Home Management;Moist Heat;Balance training;Therapeutic exercise;Therapeutic activities;Functional mobility training;Stair training;Gait training;Neuromuscular re-education;Patient/family education;Passive range of motion;Manual techniques;Dry needling;Taping    PT Next Visit Plan Discharge next visit, General strengthening, endurance progression to tolerance    PT Home Exercise Plan Access Code: [email or web address]  URL: [email or web address]  Date: 10/05/2020  Prepared by: Kearney Hard    Exercises  Supine Lower Trunk Rotation - 2 x daily - 7 x weekly - 3 reps - 10 seconds hold  Supine Posterior Pelvic Tilt - 2 x daily - 7 x weekly - 10 reps - 5 seconds hold  Supine March - 2 x daily - 7 x weekly - 10 reps  Seated Transversus Abdominis Bracing - 2 x daily - 7 x weekly - 10 reps - 5 seconds hold    Consulted and Agree with Plan of Care Patient             Patient will benefit from skilled therapeutic intervention in order to improve the following deficits and impairments:  Pain, Decreased strength, Decreased mobility, Decreased balance, Impaired flexibility, Postural dysfunction, Improper body mechanics, Decreased activity tolerance, Decreased endurance, Difficulty walking, Decreased range of motion  Visit Diagnosis: Chronic bilateral low back pain without sciatica  Muscle weakness (generalized)     Problem List Patient Active Problem List   Diagnosis Date Noted   Rib pain on left side 03/06/2017   Back pain 12/18/2014   RUQ abdominal pain 07/23/2014   Nausea with vomiting 07/23/2014   CN (constipation) 07/23/2014   Muscle pain 02/10/2014   Dizziness 01/26/2014   TIA (transient ischemic attack) 03/17/2013    Class: Acute   CAD (coronary artery disease) of artery bypass graft 03/17/2013    Class: Chronic   Dysphagia, pharyngoesophageal phase 03/17/2013    Class: Chronic   Altered mental status 03/15/2013    Class: Acute   Pacemaker  05/18/2012   Sinus bradycardia 02/04/2012   Dyspnea on exertion    Orthostatic lightheadedness    Palpitations    Syncope    Chest wall pain 12/27/2010   Dyslipidemia 12/27/2010   HTN (hypertension) 12/27/2010   Tobacco abuse 12/27/2010    Oretha Caprice, PT, MPT 11/09/2020, 2:29 PM  [facility] [address] [place], [place], [address] Phone: [phone]   Fax:  [phone]  Name: Carol Bright MRN: [id number] Date of Birth: 07/29/1930

## 2020-11-16 ENCOUNTER — Encounter: Payer: Medicare Other | Admitting: Physical Therapy

## 2020-11-18 ENCOUNTER — Other Ambulatory Visit: Payer: Self-pay

## 2020-11-18 ENCOUNTER — Encounter: Payer: Self-pay | Admitting: Rehabilitative and Restorative Service Providers"

## 2020-11-18 ENCOUNTER — Ambulatory Visit (INDEPENDENT_AMBULATORY_CARE_PROVIDER_SITE_OTHER): Payer: Medicare Other | Admitting: Rehabilitative and Restorative Service Providers"

## 2020-11-18 DIAGNOSIS — G8929 Other chronic pain: Secondary | ICD-10-CM | POA: Diagnosis not present

## 2020-11-18 DIAGNOSIS — R262 Difficulty in walking, not elsewhere classified: Secondary | ICD-10-CM | POA: Diagnosis not present

## 2020-11-18 DIAGNOSIS — M6281 Muscle weakness (generalized): Secondary | ICD-10-CM

## 2020-11-18 DIAGNOSIS — M545 Low back pain, unspecified: Secondary | ICD-10-CM | POA: Diagnosis not present

## 2020-11-18 NOTE — Patient Instructions (Signed)
Continue current HEP with addition of trunk extension AROM, sit to stand and SBP (shoulder blade pinches) throughout the day.

## 2020-11-18 NOTE — Therapy (Signed)
[facility] Physical Therapy [address] [place], [place], [address] Phone: [phone]   Fax:  [phone]  Physical Therapy Treatment/DC  Patient Details  Name: Carol Bright MRN: [id number] Date of Birth: [birth year] Referring Provider (PT): Magnus Sinning MD  PHYSICAL THERAPY DISCHARGE SUMMARY  Visits from Start of Care: 6  Current functional level related to goals / functional outcomes: See note- better   Remaining deficits: See note- addressed with HEP   Education / Equipment: Updated HEP   Patient agrees to discharge. Patient goals were partially met. Patient is being discharged due to being pleased with the current functional level.  Encounter Date: 11/18/2020   PT End of Session - 11/18/20 1619     Visit Number 6    Number of Visits 6    Date for PT Re-Evaluation 11/19/20    Authorization Type [employer]    Progress Note Due on Visit 10    PT Start Time 1102    PT Stop Time 1141    PT Time Calculation (min) 39 min    Activity Tolerance Patient limited by fatigue;Patient tolerated treatment well    Behavior During Therapy WFL for tasks assessed/performed             Past Medical History:  Diagnosis Date   Borderline diabetes    Breast cancer ([place]) 1996   BREAST CANCER -LEFT BREAST REMOVED -  1996   Cataract    Bil   Daily headache    "recently" (02/06/2012)   Diverticulosis    Dyspnea on exertion    "off and on for years; just worse recently" (02/06/2012)   External hemorrhoids    GERD (gastroesophageal reflux disease)    Guaiac + stool 2005   "went to Dr. Henrene Pastor; took out polyps" (02/06/2012)   H/O hiatal hernia    History of bronchitis    "occasionally" (02/06/2012)   Hyperlipidemia    Hypertension    Orthostatic lightheadedness    Palpitations    Rheumatoid arthritis(714.0)    PT IS FOLLOWED BY DR Jasper   Sinus bradycardia 02/04/2012   Syncope and collapse 02/04/2012   "first time ever" (02/06/2012)     Past Surgical History:  Procedure Laterality Date   CARDIAC CATHETERIZATION  02/05/2012   "first one ever" (02/06/2012)   COLONOSCOPY  11/2011   CYSTECTOMY  1993   Left shoulder   EYE SURGERY Right    LEFT AND RIGHT HEART CATHETERIZATION WITH CORONARY ANGIOGRAM N/A 02/05/2012   Procedure: LEFT AND RIGHT HEART CATHETERIZATION WITH CORONARY ANGIOGRAM;  Surgeon: Sherren Mocha, MD;  Location: [facility] CATH LAB;  Service: Cardiovascular;  Laterality: N/A;   MASTECTOMY  1995   Left   PERMANENT PACEMAKER INSERTION N/A 02/07/2012   Procedure: PERMANENT PACEMAKER INSERTION;  Surgeon: Evans Lance, MD;  Location: [facility] CATH LAB;  Service: Cardiovascular;  Laterality: N/A;   POLYPECTOMY  ~2005; ~2008   TONSILLECTOMY  1950's   VAGINAL HYSTERECTOMY  1970    There were no vitals filed for this visit.   Subjective Assessment - 11/18/20 1108     Subjective Carol Bright reports her back is much better than before starting PT.    Pertinent History CAD pacemaker, HTN, TIA, HTN, treated by Rheumatology    Limitations House hold activities;Lifting;Other (comment)    Diagnostic tests Images: moderate to severe degenerative changes L3-4 interlaminar epidural injection Oct 2020    Currently in Pain? No/denies    Pain Score 0-No pain    Pain Location Back  Pain Orientation Lower    Pain Descriptors / Indicators Sore    Pain Type Chronic pain    Pain Onset More than a month ago    Pain Frequency Occasional    Aggravating Factors  Prolonged postures    Pain Relieving Factors Uses walker when walking longer distances or takes a break to change position    Effect of Pain on Daily Activities Endurance and posture need continued work    Multiple Pain Sites No                               [facility] Adult PT Treatment/Exercise - 11/18/20 0001       Posture/Postural Control   Posture Comments Mild forward trunk lean, rounded shoulders, reduced lumbar lordosis c increased thoracic kyphosis noted  in standing/walking      Therapeutic Activites    Therapeutic Activities ADL's    ADL's Reviewed seated and standing posture, bed mobility and encouraged walker with longer periods of WB for postural correction (avoid flexion) and to decrease the strain on the R knee.      Lumbar Exercises: Stretches   Lower Trunk Rotation 4 reps;20 seconds      Lumbar Exercises: Standing   Other Standing Lumbar Exercises Shoulder blade pinches and trunk extension AROM with sit to stand 5X each (3 seconds trunk extension and 5 seconds SBP)      Lumbar Exercises: Seated   Sit to Stand 5 reps    Sit to Stand Limitations With scapular pinches and trunk extension AROM at the top    Other Seated Lumbar Exercises Marching x 20 total, reviewed abdominal contraction during activity    Other Seated Lumbar Exercises Seated transversus abdominus contraction 10X 3 seconds      Lumbar Exercises: Supine   Other Supine Lumbar Exercises Supine marching with transversus abdominus contraction    Other Supine Lumbar Exercises Supine transversus abdominus activation                     PT Education - 11/18/20 1617     Education Details Reviewed HEP, posture, body mechanics and added a few functional and postural activities to her HEP.    Person(s) Educated Patient    Methods Explanation;Demonstration;Tactile cues;Verbal cues;Handout    Comprehension Tactile cues required;Verbalized understanding;Returned demonstration;Verbal cues required              PT Short Term Goals - 11/09/20 1409       PT SHORT TERM GOAL #1   Title Pt will be independent in her initial HEP    Status Achieved               PT Long Term Goals - 11/18/20 1618       PT LONG TERM GOAL #1   Title Pt will be independent in her advanced HEP.    Status Achieved      PT LONG TERM GOAL #2   Title Pt will be able to perform sit to stand using bilateral UE support with pain </= 3/10 in her low back.    Baseline Pt stating  most days she can perform sit to stand with pain </= 3/10.    Status Achieved      PT LONG TERM GOAL #3   Title Pt will be able to improve her TUG to </= 30 seconds using assistive device.    Baseline 30 seconds using straight cane  on 11/09/2020.    Status Achieved      PT LONG TERM GOAL #4   Title Pt will be able to report </= 3/10 pain when walking 500 feet with her st cane.    Baseline Pt reproting LE fatigue with walking longer distances.  OK with walker.    Status On-going      PT LONG TERM GOAL #5   Title pt will be able to navigate up and down curb step with assisitve device safely.    Baseline unable to perform on 11/09/2020 without UE suppor bilaterally    Status On-going      PT LONG TERM GOAL #6   Title Pt will improve her FOTO score from 60%  to 72%.    Baseline Pt's FOTO dropped from 60% to 42% on 11/09/2020.    Status Not Met                   Plan - 11/18/20 1620     Clinical Impression Statement Carol Bright reports significant progress with her low back pain since starting PT.  We reviewed her program with attention on practical posture, recommendations to use the walker with longer walks to increase efficiency, decrease R knee pain and decrease postural/low back strain.  Added a few strength/postural activities to her HEP.    Personal Factors and Comorbidities Comorbidity 3+    Comorbidities PMH; CAD pacemaker, HTN, TIA, HTN, treated by Rheumatology    Examination-Activity Limitations Bed Mobility;Transfers;Other;Dressing;Lift;Sleep;Stand;Sit    Examination-Participation Restrictions Community Activity;Other    Stability/Clinical Decision Making Stable/Uncomplicated    Rehab Potential Fair    PT Frequency Other (comment)    PT Duration Other (comment)   DC   PT Treatment/Interventions ADLs/Self Care Home Management;Moist Heat;Balance training;Therapeutic exercise;Therapeutic activities;Functional mobility training;Stair training;Gait training;Neuromuscular  re-education;Patient/family education;Passive range of motion;Manual techniques;Dry needling;Taping    PT Next Visit Plan DC    PT Home Exercise Plan Access Code: [email or web address]    Consulted and Agree with Plan of Care Patient             Patient will benefit from skilled therapeutic intervention in order to improve the following deficits and impairments:  Pain, Decreased strength, Decreased mobility, Decreased balance, Impaired flexibility, Postural dysfunction, Improper body mechanics, Decreased activity tolerance, Decreased endurance, Difficulty walking, Decreased range of motion  Visit Diagnosis: Chronic bilateral low back pain without sciatica  Muscle weakness (generalized)  Difficulty in walking, not elsewhere classified     Problem List Patient Active Problem List   Diagnosis Date Noted   Rib pain on left side 03/06/2017   Back pain 12/18/2014   RUQ abdominal pain 07/23/2014   Nausea with vomiting 07/23/2014   CN (constipation) 07/23/2014   Muscle pain 02/10/2014   Dizziness 01/26/2014   TIA (transient ischemic attack) 03/17/2013    Class: Acute   CAD (coronary artery disease) of artery bypass graft 03/17/2013    Class: Chronic   Dysphagia, pharyngoesophageal phase 03/17/2013    Class: Chronic   Altered mental status 03/15/2013    Class: Acute   Pacemaker 05/18/2012   Sinus bradycardia 02/04/2012   Dyspnea on exertion    Orthostatic lightheadedness    Palpitations    Syncope    Chest wall pain 12/27/2010   Dyslipidemia 12/27/2010   HTN (hypertension) 12/27/2010   Tobacco abuse 12/27/2010    Cherlyn Cushing, PT, MPT 11/18/2020, 4:23 PM   [facility] Physical Therapy [address] [place], [place], [address] Phone: [phone]  Fax:  [phone]  Name: Carol Bright MRN: [id number] Date of Birth: 1930-09-10

## 2021-01-11 ENCOUNTER — Other Ambulatory Visit: Payer: Self-pay | Admitting: Internal Medicine

## 2021-01-11 DIAGNOSIS — F17200 Nicotine dependence, unspecified, uncomplicated: Secondary | ICD-10-CM

## 2021-02-11 ENCOUNTER — Ambulatory Visit
Admission: RE | Admit: 2021-02-11 | Discharge: 2021-02-11 | Disposition: A | Discharge status: Home / Self Care | Payer: Medicare Other | Source: Ambulatory Visit | Attending: Internal Medicine | Admitting: Internal Medicine

## 2021-02-11 DIAGNOSIS — F17200 Nicotine dependence, unspecified, uncomplicated: Secondary | ICD-10-CM

## 2021-02-23 ENCOUNTER — Encounter: Payer: Self-pay | Admitting: Internal Medicine

## 2021-02-23 ENCOUNTER — Ambulatory Visit (INDEPENDENT_AMBULATORY_CARE_PROVIDER_SITE_OTHER): Payer: Medicare Other | Admitting: Internal Medicine

## 2021-02-23 ENCOUNTER — Other Ambulatory Visit: Payer: Self-pay

## 2021-02-23 VITALS — BP 172/80 | HR 62 | Ht 59.0 in | Wt 118.0 lb

## 2021-02-23 DIAGNOSIS — I10 Essential (primary) hypertension: Secondary | ICD-10-CM | POA: Diagnosis not present

## 2021-02-23 DIAGNOSIS — R55 Syncope and collapse: Secondary | ICD-10-CM | POA: Diagnosis not present

## 2021-02-23 DIAGNOSIS — R001 Bradycardia, unspecified: Secondary | ICD-10-CM

## 2021-02-23 MED ORDER — METOPROLOL SUCCINATE ER 50 MG PO TB24: 50.0000 mg | ORAL_TABLET | Freq: Every day | ORAL | 3 refills | Status: DC

## 2021-02-23 MED ORDER — LOSARTAN POTASSIUM 25 MG PO TABS: 25.0000 mg | ORAL_TABLET | Freq: Every day | ORAL | 3 refills | Status: DC

## 2021-02-23 NOTE — Patient Instructions (Signed)
Medication Instructions:  Increase your Toprol to 50 mg daily Start Losartan 25 mg daily   *If you need a refill on your cardiac medications before your next appointment, please call your pharmacy*   Lab Work: none If you have labs (blood work) drawn today and your tests are completely normal, you will receive your results only by: [place] (if you have MyChart) OR A paper copy in the mail If you have any lab test that is abnormal or we need to change your treatment, we will call you to review the results.   Testing/Procedures: None    Follow-Up: At [facility], you and your health needs are our priority.  As part of our continuing mission to provide you with exceptional heart care, we have created designated Provider Care Teams.  These Care Teams include your primary Cardiologist (physician) and Advanced Practice Providers (APPs -  Physician Assistants and Nurse Practitioners) who all work together to provide you with the care you need, when you need it.  We recommend signing up for the patient portal called "MyChart".  Sign up information is provided on this After Visit Summary.  MyChart is used to connect with patients for Virtual Visits (Telemedicine).  Patients are able to view lab/test results, encounter notes, upcoming appointments, etc.  Non-urgent messages can be sent to your provider as well.   To learn more about what you can do with MyChart, go to [email or web address].    Your next appointment:   4 week(s)  The format for your next appointment:   In Person  Provider:   [facility]N Clinic with the Pharm D   If primary card or EP is not listed click here to update    :1}    Other Instructions

## 2021-02-23 NOTE — Progress Notes (Signed)
Cardiology Office Note   Date:  02/23/2021   ID:  Carol Bright, DOB January 29, 1930, MRN [id number]  PCP:  Donnajean Lopes, MD  Cardiologist:   Dorris Carnes, MD   Pt presents for f/u of HTN     History of Present Illness: Carol Bright is a 88 y.o. female with a history of chest pain,, HTN, RA  She is s/p PPM for bradycardia   The pt follows with Beckie Salts     Since seen she denies CP   Breathing is stable    She says her appetite is down and her wt is down    Blood pressure today is high  Current Meds  Medication Sig   amLODipine (NORVASC) 5 MG tablet Take 1 tablet (5 mg total) by mouth in the morning and at bedtime.   cetirizine (ZYRTEC) 10 MG tablet Take 10 mg by mouth daily as needed for allergies (allergies).    famotidine (PEPCID) 10 MG tablet Take 10 mg by mouth 2 (two) times daily.   folic acid (FOLVITE) 1 MG tablet Take 2 mg by mouth daily.   hydrocortisone (ANUSOL-HC) 2.5 % rectal cream Place 1 application rectally at bedtime.   Lidocaine-Menthol (1ST RELIEF SPRAY EX) Apply topically 4 (four) times daily as needed.   methotrexate (RHEUMATREX) 2.5 MG tablet Take 25 mg by mouth once a week. Takes 10 tablets on Sunday. Caution:Chemotherapy. Protect from light.   metoprolol succinate (TOPROL-XL) 25 MG 24 hr tablet Take 1 tablet by mouth daily.   polyethylene glycol (MIRALAX / GLYCOLAX) packet Take 17 g by mouth every other day.   predniSONE (DELTASONE) 1 MG tablet Take 3 mg by mouth daily.   rosuvastatin (CRESTOR) 10 MG tablet TAKE 1 TABLET(10 MG) BY MOUTH DAILY   sulfaSALAzine (AZULFIDINE) 500 MG tablet Take 500 mg by mouth 2 (two) times daily.   timolol (TIMOPTIC) 0.5 % ophthalmic solution Place 1 drop into both eyes 2 (two) times daily.   Vitamin D, Ergocalciferol, (DRISDOL) 1.25 MG (50000 UNIT) CAPS capsule Take 50,000 Units by mouth once a week.     Allergies:   Morphine, Morphine and related, Penicillins, and Penicillin g   Past Medical History:  Diagnosis Date    Borderline diabetes    Breast cancer ([place]) 1996   BREAST CANCER -LEFT BREAST REMOVED -  1996   Cataract    Bil   Daily headache    "recently" (02/06/2012)   Diverticulosis    Dyspnea on exertion    "off and on for years; just worse recently" (02/06/2012)   External hemorrhoids    GERD (gastroesophageal reflux disease)    Guaiac + stool 2005   "went to Dr. Henrene Pastor; took out polyps" (02/06/2012)   H/O hiatal hernia    History of bronchitis    "occasionally" (02/06/2012)   Hyperlipidemia    Hypertension    Orthostatic lightheadedness    Palpitations    Rheumatoid arthritis(714.0)    PT IS FOLLOWED BY DR [place]   Sinus bradycardia 02/04/2012   Syncope and collapse 02/04/2012   "first time ever" (02/06/2012)    Past Surgical History:  Procedure Laterality Date   CARDIAC CATHETERIZATION  02/05/2012   "first one ever" (02/06/2012)   COLONOSCOPY  11/2011   CYSTECTOMY  1993   Left shoulder   EYE SURGERY Right    LEFT AND RIGHT HEART CATHETERIZATION WITH CORONARY ANGIOGRAM N/A 02/05/2012   Procedure: LEFT AND RIGHT HEART CATHETERIZATION WITH CORONARY ANGIOGRAM;  Surgeon: Sherren Mocha, MD;  Location: [facility] CATH LAB;  Service: Cardiovascular;  Laterality: N/A;   MASTECTOMY  1995   Left   PERMANENT PACEMAKER INSERTION N/A 02/07/2012   Procedure: PERMANENT PACEMAKER INSERTION;  Surgeon: Evans Lance, MD;  Location: [facility] CATH LAB;  Service: Cardiovascular;  Laterality: N/A;   POLYPECTOMY  ~2005; ~2008   TONSILLECTOMY  1950's   VAGINAL HYSTERECTOMY  1970     Social History:  The patient  reports that she has been smoking cigarettes. She has a 25.00 pack-year smoking history. She has never used smokeless tobacco. She reports current alcohol use. She reports that she does not use drugs.   Family History:  The patient's family history includes Breast cancer in her mother; Cancer in her mother; Heart attack in her mother; Heart disease in her mother.    ROS:  Please see the  history of present illness. All other systems are reviewed and  Negative to the above problem except as noted.    PHYSICAL EXAM: VS:  BP (!) 172/80    Pulse 62    Ht 4\' 11"  (1.499 m)    Wt 118 lb (53.5 kg)    SpO2 97%    BMI 23.83 kg/m   GEN:  Well nourished 88 yo  in no acute distress HEENT: normal  Neck: JVP NL  no carotid bruits, Cardiac: RRR; no murmur,  No LE edema  Respiratory:  Rhonchi.   GI: soft, nontender, nondistended, + BS  No hepatomegaly  MS: no deformity Moving all extremities   Skin: warm and dry, no rash Neuro:  Strength and sensation are intact Psych: euthymic mood, full affect   EKG:  EKG is not ordered today.    Lipid Panel    Component Value Date/Time   CHOL 183 05/18/2017 0834   TRIG 85 05/18/2017 0834   HDL 73 05/18/2017 0834   CHOLHDL 2.5 05/18/2017 0834   CHOLHDL 2.9 03/17/2013 1310   VLDL 21 03/17/2013 1310   LDLCALC 93 05/18/2017 0834      Wt Readings from Last 3 Encounters:  02/23/21 118 lb (53.5 kg)  09/15/20 125 lb 6.4 oz (56.9 kg)  10/07/19 134 lb (60.8 kg)      ASSESSMENT AND PLAN:  1  HTN  BP is high    I would recomm increasing toprol to 50 mg daily (she was switched from coreg to toprol by Threasa Beards)   I would also add losartan 25 mg      Follow up in a few wks in HTN clinic  2  HL   LDL in Sept 88  HDL 54  trig 44  Ctoninue Crestor  Increase to 20 mg      3  Dizziness  Pt denies    4  S/p PPM   Pt follows with G Taylor   5  Wt loss  Pt says appetite down   REcomm she try to push protein foods    6  Hx tob use  Still smoking some     F/U in HTN clinic as noted   Current medicines are reviewed at length with the patient today.  The patient does not have concerns regarding medicines.  Signed, Dorris Carnes, MD  02/23/2021 4:28 PM    [facility] [place], [place], [place]  [address] Phone: [phone]; Fax: [phone]

## 2021-03-23 ENCOUNTER — Ambulatory Visit: Payer: Medicare Other

## 2021-03-23 NOTE — Progress Notes (Deleted)
Patient ID: Carol Bright                 DOB: 18-Feb-1930                      MRN: [id number] ? ? ? ? ?HPI: ?Carol Bright is a 88 y.o. female referred by Dr. Harrington Challenger to HTN clinic. PMH is significant for chest pain, HTN, HLD, RA, PPM for bradycardia, tobacco use, and RA. She was last seen by Dr Harrington Challenger 02/23/21 where her BP was elevated at 172/80. Her Toprol was increased to 50mg  daily and losartan 25mg  daily was started. She presents to PharmD for follow up. ? ?Needs to quit smoking ?Needs bmet ?Inc losartan if needed and bmet stable ? ?Current HTN meds: amlodipine 5mg  BID, losartan 25mg  daily, Toprol 50mg  daily ? ?BP goal: <130/10mmHg ? ?Family History: The patient's family history includes Breast cancer in her mother; Cancer in her mother; Heart attack in her mother; Heart disease in her mother.  ? ?Social History: The patient  reports that she has been smoking cigarettes. She has a 25.00 pack-year smoking history. She has never used smokeless tobacco. She reports current alcohol use. She reports that she does not use drugs.  ? ?Diet:  ? ?Exercise:  ? ?Home BP readings:  ? ?Wt Readings from Last 3 Encounters:  ?02/23/21 118 lb (53.5 kg)  ?09/15/20 125 lb 6.4 oz (56.9 kg)  ?10/07/19 134 lb (60.8 kg)  ? ?BP Readings from Last 3 Encounters:  ?02/23/21 (!) 172/80  ?09/17/20 (!) 145/72  ?09/15/20 (!) 180/84  ? ?Pulse Readings from Last 3 Encounters:  ?02/23/21 62  ?09/17/20 66  ?09/15/20 63  ? ? ?Renal function: ?CrCl cannot be calculated (Patient's most recent lab result is older than the maximum 21 days allowed.). ? ?Past Medical History:  ?Diagnosis Date  ? Borderline diabetes   ? Breast cancer ([place]) 1996  ? BREAST CANCER -LEFT BREAST REMOVED -  1996  ? Cataract   ? Bil  ? Daily headache   ? "recently" (02/06/2012)  ? Diverticulosis   ? Dyspnea on exertion   ? "off and on for years; just worse recently" (02/06/2012)  ? External hemorrhoids   ? GERD (gastroesophageal reflux disease)   ? Guaiac + stool 2005  ? "went to Dr.  Henrene Pastor; took out polyps" (02/06/2012)  ? H/O hiatal hernia   ? History of bronchitis   ? "occasionally" (02/06/2012)  ? Hyperlipidemia   ? Hypertension   ? Orthostatic lightheadedness   ? Palpitations   ? Rheumatoid arthritis(714.0)   ? PT IS FOLLOWED BY DR [place]  ? Sinus bradycardia 02/04/2012  ? Syncope and collapse 02/04/2012  ? "first time ever" (02/06/2012)  ? ? ?Current Outpatient Medications on File Prior to Visit  ?Medication Sig Dispense Refill  ? amLODipine (NORVASC) 5 MG tablet Take 1 tablet (5 mg total) by mouth in the morning and at bedtime. 180 tablet 3  ? cetirizine (ZYRTEC) 10 MG tablet Take 10 mg by mouth daily as needed for allergies (allergies).     ? famotidine (PEPCID) 10 MG tablet Take 10 mg by mouth 2 (two) times daily.    ? folic acid (FOLVITE) 1 MG tablet Take 2 mg by mouth daily.    ? hydrocortisone (ANUSOL-HC) 2.5 % rectal cream Place 1 application rectally at bedtime. 30 g 1  ? Lidocaine-Menthol (1ST RELIEF SPRAY EX) Apply topically 4 (four) times daily as needed.    ?  losartan (COZAAR) 25 MG tablet Take 1 tablet (25 mg total) by mouth daily. 90 tablet 3  ? methotrexate (RHEUMATREX) 2.5 MG tablet Take 25 mg by mouth once a week. Takes 10 tablets on Sunday. Caution:Chemotherapy. Protect from light.    ? metoprolol succinate (TOPROL-XL) 50 MG 24 hr tablet Take 1 tablet (50 mg total) by mouth daily. Take with or immediately following a meal. 90 tablet 3  ? polyethylene glycol (MIRALAX / GLYCOLAX) packet Take 17 g by mouth every other day.    ? predniSONE (DELTASONE) 1 MG tablet Take 3 mg by mouth daily.    ? rosuvastatin (CRESTOR) 10 MG tablet TAKE 1 TABLET(10 MG) BY MOUTH DAILY 90 tablet 1  ? sulfaSALAzine (AZULFIDINE) 500 MG tablet Take 500 mg by mouth 2 (two) times daily.    ? timolol (TIMOPTIC) 0.5 % ophthalmic solution Place 1 drop into both eyes 2 (two) times daily.    ? Vitamin D, Ergocalciferol, (DRISDOL) 1.25 MG (50000 UNIT) CAPS capsule Take 50,000 Units by mouth  once a week.    ? ?No current facility-administered medications on file prior to visit.  ? ? ?Allergies  ?Allergen Reactions  ? Morphine Anaphylaxis  ? Morphine And Related   ?  Thought she had a stroke after taking this medication. Also couldn't talk.  ? Penicillins Hives  ? Penicillin G Rash  ? ? ? ?Assessment/Plan: ? ?1. Hypertension -  ? ?

## 2021-03-24 ENCOUNTER — Other Ambulatory Visit: Payer: Self-pay

## 2021-03-24 ENCOUNTER — Ambulatory Visit (INDEPENDENT_AMBULATORY_CARE_PROVIDER_SITE_OTHER): Payer: Medicare Other | Admitting: Pharmacist

## 2021-03-24 ENCOUNTER — Encounter (INDEPENDENT_AMBULATORY_CARE_PROVIDER_SITE_OTHER): Payer: Self-pay

## 2021-03-24 VITALS — BP 132/64

## 2021-03-24 DIAGNOSIS — I10 Essential (primary) hypertension: Secondary | ICD-10-CM | POA: Diagnosis not present

## 2021-03-24 NOTE — Progress Notes (Signed)
Patient ID: Carol Bright                 DOB: 10-31-1930                      MRN: [id number] ? ? ? ? ?HPI: ?Carol Bright is a 88 y.o. female referred by Dr. Harrington Challenger to HTN clinic. PMH is significant for chest pain,, HTN, RA, and bradycardia s/p PPM. At last visit with Dr. Harrington Challenger on 02/23/21, patient blood pressure was 172/80. Metoprolol was increased to 50mg  daily and losartan 25mg  daily was added. ? ?Patient presents today to the HTN clinic. She states she feels a little weak today, has only eaten a waffle and water with her medications. This is not unusual for her. She denies any dizziness, SOB or swelling. She is still smoking but is trying to give it up. She did not smoke for 3 days when she was at her great granddaughter cheer competition. She has been checking her blood pressure at home, but hasn't for the last 3 days because her home cuff broke. She has to use her right arm because she has a mastectomy on her left. Reports home blood pressures in the 160's/70-80. Does not bring in any readings however.  ? ?Current HTN meds: metoprolol succinate 50mg  daily, losartan 25mg  daily, amlodipine 5mg  daily ?Previously tried: carvedilol (changed to metoprolol by PCP) ?BP goal: <140/90 ? ?Family History: The patient's family history includes Breast cancer in her mother; Cancer in her mother; Heart attack in her mother; Heart disease in her mother.  ? ?Social History: The patient reports that she has been smoking cigarettes. She has a 25.00 pack-year smoking history. She has never used smokeless tobacco. She reports current alcohol use. She reports that she does not use drugs.  ? ?Diet: 1 cup of coffee in the AM,  ? ?Exercise: walks with a cane ? ?Home BP readings: 160/70 ? ?Wt Readings from Last 3 Encounters:  ?02/23/21 118 lb (53.5 kg)  ?09/15/20 125 lb 6.4 oz (56.9 kg)  ?10/07/19 134 lb (60.8 kg)  ? ?BP Readings from Last 3 Encounters:  ?02/23/21 (!) 172/80  ?09/17/20 (!) 145/72  ?09/15/20 (!) 180/84  ? ?Pulse Readings  from Last 3 Encounters:  ?02/23/21 62  ?09/17/20 66  ?09/15/20 63  ? ? ?Renal function: ?CrCl cannot be calculated (Patient's most recent lab result is older than the maximum 21 days allowed.). ? ?Past Medical History:  ?Diagnosis Date  ? Borderline diabetes   ? Breast cancer ([place]) 1996  ? BREAST CANCER -LEFT BREAST REMOVED -  1996  ? Cataract   ? Bil  ? Daily headache   ? "recently" (02/06/2012)  ? Diverticulosis   ? Dyspnea on exertion   ? "off and on for years; just worse recently" (02/06/2012)  ? External hemorrhoids   ? GERD (gastroesophageal reflux disease)   ? Guaiac + stool 2005  ? "went to Dr. Henrene Pastor; took out polyps" (02/06/2012)  ? H/O hiatal hernia   ? History of bronchitis   ? "occasionally" (02/06/2012)  ? Hyperlipidemia   ? Hypertension   ? Orthostatic lightheadedness   ? Palpitations   ? Rheumatoid arthritis(714.0)   ? PT IS FOLLOWED BY DR Blanchard  ? Sinus bradycardia 02/04/2012  ? Syncope and collapse 02/04/2012  ? "first time ever" (02/06/2012)  ? ? ?Current Outpatient Medications on File Prior to Visit  ?Medication Sig Dispense Refill  ? amLODipine (NORVASC) 5 MG  tablet Take 1 tablet (5 mg total) by mouth in the morning and at bedtime. 180 tablet 3  ? cetirizine (ZYRTEC) 10 MG tablet Take 10 mg by mouth daily as needed for allergies (allergies).     ? famotidine (PEPCID) 10 MG tablet Take 10 mg by mouth 2 (two) times daily.    ? folic acid (FOLVITE) 1 MG tablet Take 2 mg by mouth daily.    ? hydrocortisone (ANUSOL-HC) 2.5 % rectal cream Place 1 application rectally at bedtime. 30 g 1  ? Lidocaine-Menthol (1ST RELIEF SPRAY EX) Apply topically 4 (four) times daily as needed.    ? losartan (COZAAR) 25 MG tablet Take 1 tablet (25 mg total) by mouth daily. 90 tablet 3  ? methotrexate (RHEUMATREX) 2.5 MG tablet Take 25 mg by mouth once a week. Takes 10 tablets on Sunday. Caution:Chemotherapy. Protect from light.    ? metoprolol succinate (TOPROL-XL) 50 MG 24 hr tablet Take 1 tablet (50 mg  total) by mouth daily. Take with or immediately following a meal. 90 tablet 3  ? polyethylene glycol (MIRALAX / GLYCOLAX) packet Take 17 g by mouth every other day.    ? predniSONE (DELTASONE) 1 MG tablet Take 3 mg by mouth daily.    ? rosuvastatin (CRESTOR) 10 MG tablet TAKE 1 TABLET(10 MG) BY MOUTH DAILY 90 tablet 1  ? sulfaSALAzine (AZULFIDINE) 500 MG tablet Take 500 mg by mouth 2 (two) times daily.    ? timolol (TIMOPTIC) 0.5 % ophthalmic solution Place 1 drop into both eyes 2 (two) times daily.    ? Vitamin D, Ergocalciferol, (DRISDOL) 1.25 MG (50000 UNIT) CAPS capsule Take 50,000 Units by mouth once a week.    ? ?No current facility-administered medications on file prior to visit.  ? ? ?Allergies  ?Allergen Reactions  ? Morphine Anaphylaxis  ? Morphine And Related   ?  Thought she had a stroke after taking this medication. Also couldn't talk.  ? Penicillins Hives  ? Penicillin G Rash  ? ? ?There were no vitals taken for this visit. ? ? ?Assessment/Plan: ? ?1. Hypertension - Blood pressure is at goal of <140/90 in clinic today. Patient struggles with transportation. I have given her a blood pressure cuff from the patient care fund. Reviewed how to use it an provided her with batteries. Compared its reading to manual cuff reading and they were similar. No medication changes. Continue metoprolol succinate 50mg  daily, losartan 25mg  daily and amlodipine 5mg  daily. I have asked her to check her blood pressure once a day at home. Will follow up in 3-4 weeks. Will make it a telephone visit due to transportation issues. Check BMP today sine losartan 25mg  daily was added at last visit.  ? ? ?Thank you ? ?Ramond Dial, Pharm.D, BCPS, CPP ?[id number] [place], [place] [address]  ?Phone: [phone]; Fax: [phone]  ? ? ?

## 2021-03-24 NOTE — Patient Instructions (Addendum)
Please continue taking metoprolol succinate 50mg  daily, losartan 25mg  daily, amlodipine 5mg  daily ? ?Please check your blood pressure once a day at home. Make sure you rest 5 min prior to checking ? ?Make sure you are drinking water and eating protein ? ?

## 2021-03-25 ENCOUNTER — Telehealth: Payer: Self-pay | Admitting: Pharmacist

## 2021-03-25 ENCOUNTER — Other Ambulatory Visit: Payer: Self-pay | Admitting: Internal Medicine

## 2021-03-25 DIAGNOSIS — M5414 Radiculopathy, thoracic region: Secondary | ICD-10-CM

## 2021-03-25 LAB — BASIC METABOLIC PANEL
BUN/Creatinine Ratio: 13 (ref 12–28)
BUN: 11 mg/dL (ref 8–27)
CO2: 22 mmol/L (ref 20–29)
Calcium: 9.2 mg/dL (ref 8.7–10.3)
Chloride: 106 mmol/L (ref 96–106)
Creatinine, Ser: 0.84 mg/dL (ref 0.57–1.00)
Glucose: 80 mg/dL (ref 70–99)
Potassium: 4.6 mmol/L (ref 3.5–5.2)
Sodium: 141 mmol/L (ref 134–144)
eGFR: 66 mL/min/{1.73_m2} (ref >59)

## 2021-03-25 NOTE — Telephone Encounter (Signed)
Called pt and reviewed lab work. No med changes needed. ?

## 2021-04-15 ENCOUNTER — Ambulatory Visit (INDEPENDENT_AMBULATORY_CARE_PROVIDER_SITE_OTHER): Payer: Medicare Other | Admitting: Pharmacist

## 2021-04-15 ENCOUNTER — Other Ambulatory Visit: Payer: Self-pay

## 2021-04-15 DIAGNOSIS — I10 Essential (primary) hypertension: Secondary | ICD-10-CM

## 2021-04-15 NOTE — Progress Notes (Signed)
Patient ID: SYNDA BAGENT                 DOB: November 03, 1930                      MRN: [id number] ? ? ? ? ?HPI: ?Carol Bright is a 88 y.o. female referred by Dr. Harrington Challenger to HTN clinic. PMH is significant for chest pain,, HTN, RA, and bradycardia s/p PPM. At last visit with Dr. Harrington Challenger on 02/23/21, patient blood pressure was 172/80. Metoprolol was increased to '50mg'$  daily and losartan '25mg'$  daily was added. At last visit with HTN clinic patients blood pressure was 132/64. She was provided with a blood pressure cuff. ? ?Todays visit was done via telephone. She states she feels a little weak today, This is not unusual for her. She denies any dizziness, SOB or swelling. She is still smoking but is trying to give it up. Still smokes with her AM coffee.  She has been checking her blood pressure at home. The highest it has been was 149/65. Most in the 130's or low 140's. HR remains at 60.  ? ?Current HTN meds: metoprolol succinate '50mg'$  daily, losartan '25mg'$  daily, amlodipine '5mg'$  daily ?Previously tried: carvedilol (changed to metoprolol by PCP) ?BP goal: <140/90 ? ?Family History: The patient's family history includes Breast cancer in her mother; Cancer in her mother; Heart attack in her mother; Heart disease in her mother.  ? ?Social History: The patient reports that she has been smoking cigarettes. She has a 25.00 pack-year smoking history. She has never used smokeless tobacco. She reports current alcohol use. She reports that she does not use drugs.  ? ?Diet: 1 cup of coffee in the AM,  ? ?Exercise: walks with a cane ? ?Home BP readings: no more than 149/65 , 139/65, 139/64 HR 61 140/64 142/63 149/61 134/63 HR in the 60's ? ?Wt Readings from Last 3 Encounters:  ?02/23/21 118 lb (53.5 kg)  ?09/15/20 125 lb 6.4 oz (56.9 kg)  ?10/07/19 134 lb (60.8 kg)  ? ?BP Readings from Last 3 Encounters:  ?03/24/21 132/64  ?02/23/21 (!) 172/80  ?09/17/20 (!) 145/72  ? ?Pulse Readings from Last 3 Encounters:  ?02/23/21 62  ?09/17/20 66  ?09/15/20 63   ? ? ?Renal function: ?CrCl cannot be calculated (Patient's most recent lab result is older than the maximum 21 days allowed.). ? ?Past Medical History:  ?Diagnosis Date  ? Borderline diabetes   ? Breast cancer ([place]) 1996  ? BREAST CANCER -LEFT BREAST REMOVED -  1996  ? Cataract   ? Bil  ? Daily headache   ? "recently" (02/06/2012)  ? Diverticulosis   ? Dyspnea on exertion   ? "off and on for years; just worse recently" (02/06/2012)  ? External hemorrhoids   ? GERD (gastroesophageal reflux disease)   ? Guaiac + stool 2005  ? "went to Dr. Henrene Pastor; took out polyps" (02/06/2012)  ? H/O hiatal hernia   ? History of bronchitis   ? "occasionally" (02/06/2012)  ? Hyperlipidemia   ? Hypertension   ? Orthostatic lightheadedness   ? Palpitations   ? Rheumatoid arthritis(714.0)   ? PT IS FOLLOWED BY DR [place]  ? Sinus bradycardia 02/04/2012  ? Syncope and collapse 02/04/2012  ? "first time ever" (02/06/2012)  ? ? ?Current Outpatient Medications on File Prior to Visit  ?Medication Sig Dispense Refill  ? amLODipine (NORVASC) 5 MG tablet Take 1 tablet (5 mg total) by mouth  in the morning and at bedtime. 180 tablet 3  ? cetirizine (ZYRTEC) 10 MG tablet Take 10 mg by mouth daily as needed for allergies (allergies).     ? famotidine (PEPCID) 10 MG tablet Take 10 mg by mouth 2 (two) times daily.    ? folic acid (FOLVITE) 1 MG tablet Take 2 mg by mouth daily.    ? hydrocortisone (ANUSOL-HC) 2.5 % rectal cream Place 1 application rectally at bedtime. 30 g 1  ? Lidocaine-Menthol (1ST RELIEF SPRAY EX) Apply topically 4 (four) times daily as needed.    ? losartan (COZAAR) 25 MG tablet Take 1 tablet (25 mg total) by mouth daily. 90 tablet 3  ? methotrexate (RHEUMATREX) 2.5 MG tablet Take 25 mg by mouth once a week. Takes 10 tablets on Sunday. Caution:Chemotherapy. Protect from light.    ? metoprolol succinate (TOPROL-XL) 50 MG 24 hr tablet Take 1 tablet (50 mg total) by mouth daily. Take with or immediately following a meal. 90  tablet 3  ? polyethylene glycol (MIRALAX / GLYCOLAX) packet Take 17 g by mouth every other day.    ? predniSONE (DELTASONE) 1 MG tablet Take 3 mg by mouth daily.    ? rosuvastatin (CRESTOR) 10 MG tablet TAKE 1 TABLET(10 MG) BY MOUTH DAILY 90 tablet 1  ? sulfaSALAzine (AZULFIDINE) 500 MG tablet Take 500 mg by mouth 2 (two) times daily.    ? timolol (TIMOPTIC) 0.5 % ophthalmic solution Place 1 drop into both eyes 2 (two) times daily.    ? Vitamin D, Ergocalciferol, (DRISDOL) 1.25 MG (50000 UNIT) CAPS capsule Take 50,000 Units by mouth once a week.    ? ?No current facility-administered medications on file prior to visit.  ? ? ?Allergies  ?Allergen Reactions  ? Morphine Anaphylaxis  ? Morphine And Related   ?  Thought she had a stroke after taking this medication. Also couldn't talk.  ? Penicillins Hives  ? Penicillin G Rash  ? ? ?There were no vitals taken for this visit. ? ? ?Assessment/Plan: ? ?1. Hypertension - Blood pressure is at goal of <140/90 most of the time. Continue metoprolol succinate '50mg'$  daily, losartan '25mg'$  daily, amlodipine '5mg'$  daily. Follow up as needed. Patient instructed to call if BP is consistently > 140/90. ? ? ?Thank you ? ?Ramond Dial, Pharm.D, BCPS, CPP ?[id number], [place], [place] [address]  ?Phone: [phone]; Fax: [phone]  ? ? ?

## 2021-05-29 ENCOUNTER — Other Ambulatory Visit: Payer: Self-pay | Admitting: Internal Medicine

## 2021-09-12 ENCOUNTER — Emergency Department (HOSPITAL_COMMUNITY)
Admission: EM | Admit: 2021-09-12 | Discharge: 2021-09-12 | Disposition: A | Discharge status: Home / Self Care | Payer: Medicare Other | Attending: Emergency Medicine | Admitting: Emergency Medicine

## 2021-09-12 ENCOUNTER — Encounter (HOSPITAL_COMMUNITY): Payer: Self-pay

## 2021-09-12 ENCOUNTER — Emergency Department (HOSPITAL_COMMUNITY): Payer: Medicare Other

## 2021-09-12 ENCOUNTER — Other Ambulatory Visit: Payer: Self-pay

## 2021-09-12 DIAGNOSIS — R0789 Other chest pain: Secondary | ICD-10-CM | POA: Insufficient documentation

## 2021-09-12 DIAGNOSIS — R079 Chest pain, unspecified: Secondary | ICD-10-CM | POA: Diagnosis present

## 2021-09-12 DIAGNOSIS — Z950 Presence of cardiac pacemaker: Secondary | ICD-10-CM | POA: Insufficient documentation

## 2021-09-12 DIAGNOSIS — M25511 Pain in right shoulder: Secondary | ICD-10-CM | POA: Diagnosis not present

## 2021-09-12 LAB — CBC
HCT: 34.2 % — ABNORMAL LOW (ref 36.0–46.0)
Hemoglobin: 10.8 g/dL — ABNORMAL LOW (ref 12.0–15.0)
MCH: 30.7 pg (ref 26.0–34.0)
MCHC: 31.6 g/dL (ref 30.0–36.0)
MCV: 97.2 fL (ref 80.0–100.0)
Platelets: 386 10*3/uL (ref 150–400)
RBC: 3.52 MIL/uL — ABNORMAL LOW (ref 3.87–5.11)
RDW: 13.1 % (ref 11.5–15.5)
WBC: 8.2 10*3/uL (ref 4.0–10.5)
nRBC: 0 % (ref 0.0–0.2)

## 2021-09-12 LAB — BASIC METABOLIC PANEL
Anion gap: 8 (ref 5–15)
BUN: 10 mg/dL (ref 8–23)
CO2: 21 mmol/L — ABNORMAL LOW (ref 22–32)
Calcium: 9.1 mg/dL (ref 8.9–10.3)
Chloride: 111 mmol/L (ref 98–111)
Creatinine, Ser: 0.8 mg/dL (ref 0.44–1.00)
GFR, Estimated: >60 mL/min (ref >60)
Glucose, Bld: 91 mg/dL (ref 70–99)
Potassium: 4.1 mmol/L (ref 3.5–5.1)
Sodium: 140 mmol/L (ref 135–145)

## 2021-09-12 LAB — TROPONIN I (HIGH SENSITIVITY)
Troponin I (High Sensitivity): 5 ng/L (ref <18)
Troponin I (High Sensitivity): 6 ng/L (ref <18)

## 2021-09-12 NOTE — ED Provider Notes (Signed)
[facility] EMERGENCY DEPARTMENT Provider Note   CSN: [id number] Arrival date & time: 09/12/21  5397     History  Chief Complaint  Patient presents with   Chest Pain    Carol Bright is a 88 y.o. female.  57-year-old female with prior medical history as detailed below presents for evaluation.  Patient reports that she has been having pain to the right side of her anterior chest involving the area around her pacemaker and up into her right shoulder.  The pain began at least 1 to 2 weeks ago.  Patient's pain is worse with movement of her right shoulder.  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  She denies any specific injury.  She did reports that her pacemaker has been in place for more than 10 years.  She denies any fever, cough, nausea, vomiting, shortness of breath, back pain, other complaint.  She has not taken anything for the pain.  She decided that she should be "checked out" given the continued discomfort that she has been experiencing.  She reports that her pacemaker does not have a defibrillator.  She denies symptoms consistent with defibrillator discharge.    The history is provided by the patient and medical records.  Chest Pain Pain location:  R chest Pain quality: sharp   Pain radiates to:  Does not radiate      Home Medications Prior to Admission medications   Medication Sig Start Date End Date Taking? Authorizing Provider  amLODipine (NORVASC) 5 MG tablet Take 1 tablet (5 mg total) by mouth in the morning and at bedtime. 09/15/20   Evans Lance, MD  cetirizine (ZYRTEC) 10 MG tablet Take 10 mg by mouth daily as needed for allergies (allergies).     [provider]  famotidine (PEPCID) 10 MG tablet Take 10 mg by mouth 2 (two) times daily.    [provider]  folic acid (FOLVITE) 1 MG tablet Take 2 mg by mouth daily.    [provider]  hydrocortisone  (ANUSOL-HC) 2.5 % rectal cream Place 1 application rectally at bedtime. 10/07/19   Irene Shipper, MD  Lidocaine-Menthol (1ST RELIEF SPRAY EX) Apply topically 4 (four) times daily as needed.    [provider]  losartan (COZAAR) 25 MG tablet Take 1 tablet (25 mg total) by mouth daily. 02/23/21   Fay Records, MD  methotrexate (RHEUMATREX) 2.5 MG tablet Take 25 mg by mouth once a week. Takes 10 tablets on Sunday. Caution:Chemotherapy. Protect from light.    [provider]  metoprolol succinate (TOPROL-XL) 50 MG 24 hr tablet Take 1 tablet (50 mg total) by mouth daily. Take with or immediately following a meal. 02/23/21   Fay Records, MD  polyethylene glycol [facility] / Floria Raveling) packet Take 17 g by mouth every other day.    [provider]  predniSONE (DELTASONE) 1 MG tablet Take 3 mg by mouth daily. 06/18/18   [provider]  rosuvastatin (CRESTOR) 10 MG tablet TAKE 1 TABLET(10 MG) BY MOUTH DAILY 05/30/21   Fay Records, MD  sulfaSALAzine (AZULFIDINE) 500 MG tablet Take 500 mg by mouth 2 (two) times daily. 09/12/19   [provider]  timolol (TIMOPTIC) 0.5 % ophthalmic solution Place 1 drop into both eyes 2 (two) times daily. 08/03/16   [provider]  Vitamin D, Ergocalciferol, (DRISDOL) 1.25 MG (50000 UNIT) CAPS capsule Take 50,000 Units by mouth  once a week. 09/26/19   [provider]      Allergies    Morphine, Morphine and related, Penicillins, and Penicillin g    Review of Systems   Review of Systems  Cardiovascular:  Positive for chest pain.  All other systems reviewed and are negative.   Physical Exam Updated Vital Signs BP (!) 162/79 (BP Location: Right Arm)   Pulse (!) 59   Temp 97.7 F (36.5 C) (Oral)   Resp 16   Ht '4\' 11"'$  (1.499 m)   Wt 53.5 kg   SpO2 100%   BMI 23.83 kg/m  Physical Exam Chest:     Chest wall: Tenderness present.     Comments: Patient's pain is completely reproducible with gentle palpation  overlying the anterior aspect of the right shoulder.  The and overlying the pacemaker is without erythema, edema, or evidence of fluid buildup.  She has minimal reproducible pain with palpation just superior to the pacemaker pocket itself.  Again, no overlying erythema, edema, signs of infection etc.    ED Results / Procedures / Treatments   Labs (all labs ordered are listed, but only abnormal results are displayed) Labs Reviewed  BASIC METABOLIC PANEL - Abnormal; Notable for the following components:      Result Value   CO2 21 (*)    All other components within normal limits  CBC - Abnormal; Notable for the following components:   RBC 3.52 (*)    Hemoglobin 10.8 (*)    HCT 34.2 (*)    All other components within normal limits  TROPONIN I (HIGH SENSITIVITY)  TROPONIN I (HIGH SENSITIVITY)    EKG EKG Interpretation  Date/Time:  Monday September 12 2021 09:16:44 EDT Ventricular Rate:  62 PR Interval:  304 QRS Duration: 76 QT Interval:  378 QTC Calculation: 383 R Axis:   78 Text Interpretation: Atrial-paced rhythm with prolonged AV conduction Nonspecific ST abnormality Abnormal ECG When compared with ECG of 14-Nov-2015 14:43, PREVIOUS ECG IS PRESENT Confirmed by Dene Gentry [phone]) on 09/12/2021 11:34:55 AM  Radiology DG Chest 2 View  Result Date: 09/12/2021 CLINICAL DATA:  Provided history: Chest pain. EXAM: CHEST - 2 VIEW COMPARISON:  Chest CT 02/11/2021. Prior chest radiographs 11/14/2015 and earlier. FINDINGS: Right chest dual lead implantable cardiac device. Mild cardiomegaly, unchanged. Aortic atherosclerosis. No appreciable airspace consolidation or pulmonary edema. No evidence of pleural effusion or pneumothorax. No acute bony abnormality identified. Spondylosis and dextrocurvature of the thoracic spine. IMPRESSION: No evidence of acute cardiopulmonary abnormality. Mild cardiomegaly. Aortic Atherosclerosis (ICD10-I70.0). Electronically Signed   By: Kellie Simmering D.O.   On:  09/12/2021 09:43    Procedures Procedures    Medications Ordered in ED Medications - No data to display  ED Course/ Medical Decision Making/ A&P                           Medical Decision Making Amount and/or Complexity of Data Reviewed Labs: ordered. Radiology: ordered.    Medical Screen Complete  This patient presented to the ED with complaint of right shoulder and right chest pain.  This complaint involves an extensive number of treatment options. The initial differential diagnosis includes, but is not limited to, musculoskeletal pain, ACS, pulmonary pathology, metabolic abnormality, etc.  This presentation is: Acute, Chronic, Self-Limited, Previously Undiagnosed, Uncertain Prognosis, and Complicated  Patient presents with reproducible right-sided anterior chest wall pain.  This pain is likely muscular in origin.  Patient's pacemaker is right-sided.  Patient does report some mild discomfort near the site of the pacemaker.  Pacemaker pocket appears to be without evidence of infection.  Pacemaker itself is more than 86 years old.  Exam is consistent with likely musculoskeletal pain.  Screening labs obtained are without significant abnormality.  Patient reassured by ED evaluation.  Importance of close follow-up stressed.  Strict return precautions given and understood.    Additional history obtained:  External records from outside sources obtained and reviewed including prior ED visits and prior Inpatient records.    Lab Tests:  I ordered and personally interpreted labs.  The pertinent results include: CBC, BMP, troponin   Imaging Studies ordered:  I ordered imaging studies including chest x-ray  I independently visualized and interpreted obtained imaging which showed NAD I agree with the radiologist interpretation.   Cardiac Monitoring:  The patient was maintained on a cardiac monitor.  I personally viewed and interpreted the cardiac monitor which showed  an underlying rhythm of: NSR Problem List / ED Course:  Chest wall pain   Reevaluation:  After the interventions noted above, I reevaluated the patient and found that they have: improved  Disposition:  After consideration of the diagnostic results and the patients response to treatment, I feel that the patent would benefit from close outpatient followup.          Final Clinical Impression(s) / ED Diagnoses Final diagnoses:  Chest wall pain    Rx / DC Orders ED Discharge Orders     None         Valarie Merino, MD 09/12/21 1414

## 2021-09-12 NOTE — ED Triage Notes (Addendum)
Patient has been having right sided chest pain since 1pm yesterday with pain radiating down right arm.  Reports it has been intermittent.  Medic keeps talking about pacemaker keeps firing but unsure what that means.  Pacemaker is on the right side. Complains of dizziness as well.  Pacemaker is [employer]

## 2021-09-12 NOTE — Discharge Instructions (Addendum)
Return for any problem.  ?

## 2021-12-14 ENCOUNTER — Other Ambulatory Visit (HOSPITAL_COMMUNITY): Payer: Self-pay | Admitting: Internal Medicine

## 2021-12-14 DIAGNOSIS — M546 Pain in thoracic spine: Secondary | ICD-10-CM

## 2021-12-21 ENCOUNTER — Encounter (HOSPITAL_COMMUNITY)
Admission: RE | Admit: 2021-12-21 | Discharge: 2021-12-21 | Disposition: A | Discharge status: Home / Self Care | Payer: Medicare Other | Source: Ambulatory Visit | Attending: Internal Medicine | Admitting: Internal Medicine

## 2021-12-21 ENCOUNTER — Ambulatory Visit (HOSPITAL_COMMUNITY)
Admission: RE | Admit: 2021-12-21 | Discharge: 2021-12-21 | Disposition: A | Discharge status: Home / Self Care | Payer: Medicare Other | Source: Ambulatory Visit | Attending: Internal Medicine | Admitting: Internal Medicine

## 2021-12-21 DIAGNOSIS — M546 Pain in thoracic spine: Secondary | ICD-10-CM | POA: Insufficient documentation

## 2021-12-21 MED ORDER — TECHNETIUM TC 99M MEDRONATE IV KIT
20.0000 | PACK | Freq: Once | INTRAVENOUS | Status: AC | PRN
  Administered 2021-12-21: 20.9 via INTRAVENOUS

## 2022-01-09 ENCOUNTER — Other Ambulatory Visit: Payer: Self-pay | Admitting: Internal Medicine

## 2022-01-09 DIAGNOSIS — R911 Solitary pulmonary nodule: Secondary | ICD-10-CM

## 2022-01-31 ENCOUNTER — Encounter: Payer: Self-pay | Admitting: Internal Medicine

## 2022-01-31 ENCOUNTER — Ambulatory Visit: Payer: Medicare Other | Attending: Internal Medicine | Admitting: Internal Medicine

## 2022-01-31 VITALS — BP 132/60 | HR 64 | Ht 59.0 in | Wt 126.0 lb

## 2022-01-31 DIAGNOSIS — I495 Sick sinus syndrome: Secondary | ICD-10-CM | POA: Diagnosis not present

## 2022-01-31 DIAGNOSIS — Z950 Presence of cardiac pacemaker: Secondary | ICD-10-CM

## 2022-01-31 DIAGNOSIS — I4891 Unspecified atrial fibrillation: Secondary | ICD-10-CM | POA: Insufficient documentation

## 2022-01-31 DIAGNOSIS — I10 Essential (primary) hypertension: Secondary | ICD-10-CM

## 2022-01-31 LAB — CUP PACEART INCLINIC DEVICE CHECK
Date Time Interrogation Session: 20240109114930
Implantable Lead Connection Status: 753985
Implantable Lead Connection Status: 753985
Implantable Lead Implant Date: 20140115
Implantable Lead Implant Date: 20140115
Implantable Lead Location: 753859
Implantable Lead Location: 753860
Implantable Lead Model: 4135
Implantable Lead Model: 4136
Implantable Lead Serial Number: 29298114
Implantable Lead Serial Number: 29326965
Implantable Pulse Generator Implant Date: 20140115
Lead Channel Impedance Value: 448 Ohm
Lead Channel Impedance Value: 561 Ohm
Lead Channel Pacing Threshold Amplitude: 0.8 V
Lead Channel Pacing Threshold Amplitude: 1.4 V
Lead Channel Pacing Threshold Pulse Width: 0.4 ms
Lead Channel Pacing Threshold Pulse Width: 0.4 ms
Lead Channel Sensing Intrinsic Amplitude: 13.7 mV
Lead Channel Sensing Intrinsic Amplitude: 4.5 mV
Lead Channel Setting Pacing Amplitude: 2 V
Lead Channel Setting Pacing Amplitude: 2.4 V
Lead Channel Setting Pacing Pulse Width: 0.4 ms
Lead Channel Setting Sensing Sensitivity: 2.5 mV
Pulse Gen Serial Number: 111372
Zone Setting Status: 755011

## 2022-01-31 NOTE — Progress Notes (Signed)
HPI Carol Bright returns today for followup. She is a patient of Dr. Harrington Challenger'. She has a h/o HTN, sinus node dysfunction s/p PPM insertion, HTN, and denies chest pain or sob. She admits to non-compliance. She has become a bit more sedentary but she still walks with the assistance of a cane. She has not had syncope. She denies peripheral edema. She notes that she feel well. No edema.  Allergies  Allergen Reactions   Morphine Anaphylaxis   Morphine And Related     Thought she had a stroke after taking this medication. Also couldn't talk.   Penicillins Hives   Penicillin G Rash     Current Outpatient Medications  Medication Sig Dispense Refill   amLODipine (NORVASC) 5 MG tablet Take 1 tablet (5 mg total) by mouth in the morning and at bedtime. 180 tablet 3   cetirizine (ZYRTEC) 10 MG tablet Take 10 mg by mouth daily as needed for allergies (allergies).      famotidine (PEPCID) 10 MG tablet Take 10 mg by mouth 2 (two) times daily.     folic acid (FOLVITE) 1 MG tablet Take 2 mg by mouth daily.     hydrocortisone (ANUSOL-HC) 2.5 % rectal cream Place 1 application rectally at bedtime. 30 g 1   Lidocaine-Menthol (1ST RELIEF SPRAY EX) Apply topically 4 (four) times daily as needed.     losartan (COZAAR) 25 MG tablet Take 1 tablet (25 mg total) by mouth daily. 90 tablet 3   methotrexate (RHEUMATREX) 2.5 MG tablet Take 25 mg by mouth once a week. Takes 10 tablets on Sunday. Caution:Chemotherapy. Protect from light.     metoprolol succinate (TOPROL-XL) 50 MG 24 hr tablet Take 1 tablet (50 mg total) by mouth daily. Take with or immediately following a meal. 90 tablet 3   polyethylene glycol (MIRALAX / GLYCOLAX) packet Take 17 g by mouth every other day.     predniSONE (DELTASONE) 1 MG tablet Take 3 mg by mouth daily.     rosuvastatin (CRESTOR) 10 MG tablet TAKE 1 TABLET(10 MG) BY MOUTH DAILY 90 tablet 1   sulfaSALAzine (AZULFIDINE) 500 MG tablet Take 500 mg by mouth 2 (two) times daily.     timolol  (TIMOPTIC) 0.5 % ophthalmic solution Place 1 drop into both eyes 2 (two) times daily.     Vitamin D, Ergocalciferol, (DRISDOL) 1.25 MG (50000 UNIT) CAPS capsule Take 50,000 Units by mouth once a week.     No current facility-administered medications for this visit.     Past Medical History:  Diagnosis Date   Borderline diabetes    Breast cancer ([place]) 1996   BREAST CANCER -LEFT BREAST REMOVED -  1996   Cataract    Bil   Daily headache    "recently" (02/06/2012)   Diverticulosis    Dyspnea on exertion    "off and on for years; just worse recently" (02/06/2012)   External hemorrhoids    GERD (gastroesophageal reflux disease)    Guaiac + stool 2005   "went to Dr. Henrene Pastor; took out polyps" (02/06/2012)   H/O hiatal hernia    History of bronchitis    "occasionally" (02/06/2012)   Hyperlipidemia    Hypertension    Orthostatic lightheadedness    Palpitations    Rheumatoid arthritis(714.0)    PT IS FOLLOWED BY DR McLouth   Sinus bradycardia 02/04/2012   Syncope and collapse 02/04/2012   "first time ever" (02/06/2012)    ROS:   All  systems reviewed and negative except as noted in the HPI.   Past Surgical History:  Procedure Laterality Date   CARDIAC CATHETERIZATION  02/05/2012   "first one ever" (02/06/2012)   COLONOSCOPY  11/2011   CYSTECTOMY  1993   Left shoulder   EYE SURGERY Right    LEFT AND RIGHT HEART CATHETERIZATION WITH CORONARY ANGIOGRAM N/A 02/05/2012   Procedure: LEFT AND RIGHT HEART CATHETERIZATION WITH CORONARY ANGIOGRAM;  Surgeon: Sherren Mocha, MD;  Location: [facility] CATH LAB;  Service: Cardiovascular;  Laterality: N/A;   MASTECTOMY  1995   Left   PERMANENT PACEMAKER INSERTION N/A 02/07/2012   Procedure: PERMANENT PACEMAKER INSERTION;  Surgeon: Evans Lance, MD;  Location: [facility] CATH LAB;  Service: Cardiovascular;  Laterality: N/A;   POLYPECTOMY  ~2005; ~2008   TONSILLECTOMY  1950's   VAGINAL HYSTERECTOMY  1970     Family History  Problem  Relation Age of Onset   Cancer Mother        LEFT BREAST REMOVED   Heart attack Mother    Heart disease Mother    Breast cancer Mother      Social History   Socioeconomic History   Marital status: Widowed    Spouse name: Not on file   Number of children: 3   Years of education: Not on file   Highest education level: Not on file  Occupational History   Occupation: retired  Tobacco Use   Smoking status: Every Day    Packs/day: 0.50    Years: 50.00    Total pack years: 25.00    Types: Cigarettes   Smokeless tobacco: Never   Tobacco comments:    02/06/2012 'stopped smoking 4-5 times"; offered smoking cessation materials; pt declines  Vaping Use   Vaping Use: Never used  Substance and Sexual Activity   Alcohol use: Yes    Alcohol/week: 0.0 standard drinks of alcohol    Comment: 02/06/2012 "occasional; "like a holiday"   Drug use: No   Sexual activity: Never  Other Topics Concern   Not on file  Social History Narrative   Not on file   Social Determinants of Health   Financial Resource Strain: Not on file  Food Insecurity: Not on file  Transportation Needs: Not on file  Physical Activity: Not on file  Stress: Not on file  Social Connections: Not on file  Intimate Partner Violence: Not on file     BP 132/60   Pulse 64   Ht '4\' 11"'$  (1.499 m)   Wt 126 lb (57.2 kg)   SpO2 98%   BMI 25.45 kg/m   Physical Exam:  Well appearing NAD HEENT: Unremarkable Neck:  No JVD, no thyromegally Lymphatics:  No adenopathy Back:  No CVA tenderness Lungs:  Clear with no wheezes HEART:  Regular rate rhythm, no murmurs, no rubs, no clicks Abd:  soft, positive bowel sounds, no organomegally, no rebound, no guarding Ext:  2 plus pulses, no edema, no cyanosis, no clubbing Skin:  No rashes no nodules Neuro:  CN II through XII intact, motor grossly intact  EKG - nsr with first degree AV block  DEVICE  Normal device function.  See PaceArt for details.    Assess/Plan:  Paroxysmal atrial fib - the patient's rate is well controlled and she is maintaining NSR PPM -her [employer] DDD PM is working normally. We will recheck in several months. She is not dependent and her PM has been in 10 years. Her device is on an advisory and because  she is not dependent and still has 4 years on the battery, we will not plan on a gen change out unless her battery shows premature depletion. HTN - her bp is up as she is out of her amlodipine for several days.  Tobacco abuse - ongoing. She is encouraged to stop smoking.   Carol Overlie Dariyah Garduno,MD

## 2022-01-31 NOTE — Patient Instructions (Addendum)
Medication Instructions:  Your physician recommends that you continue on your current medications as directed. Please refer to the Current Medication list given to you today.  *If you need a refill on your cardiac medications before your next appointment, please call your pharmacy*  Lab Work: None ordered.  If you have labs (blood work) drawn today and your tests are completely normal, you will receive your results only by: [place] (if you have MyChart) OR A paper copy in the mail If you have any lab test that is abnormal or we need to change your treatment, we will call you to review the results.  Testing/Procedures: None ordered.  Follow-Up: At [facility], you and your health needs are our priority.  As part of our continuing mission to provide you with exceptional heart care, we have created designated Provider Care Teams.  These Care Teams include your primary Cardiologist (physician) and Advanced Practice Providers (APPs -  Physician Assistants and Nurse Practitioners) who all work together to provide you with the care you need, when you need it.  We recommend signing up for the patient portal called "MyChart".  Sign up information is provided on this After Visit Summary.  MyChart is used to connect with patients for Virtual Visits (Telemedicine).  Patients are able to view lab/test results, encounter notes, upcoming appointments, etc.  Non-urgent messages can be sent to your provider as well.   To learn more about what you can do with MyChart, go to [email or web address].    Your next appointment:    6 months with device clinic  1 year with Dr. Lovena Le

## 2022-04-06 ENCOUNTER — Telehealth: Payer: Self-pay | Admitting: *Deleted

## 2022-04-06 NOTE — Telephone Encounter (Signed)
Clearance faxed as requested

## 2022-04-06 NOTE — Telephone Encounter (Signed)
   Pre-operative Risk Assessment    Patient Name: Carol Bright  DOB: 15-Jul-1930 MRN: [id number]     Request for Surgical Clearance    Procedure:  Dental Extraction - Amount of Teeth to be Pulled:  16 TEETH SURGICALLY EXTRACTED  Date of Surgery:  Clearance TBD                                 Surgeon:  DR. Perlie Gold, DDS Surgeon's Group or Practice Name:  [place] Phone number:  [phone] Fax number:  [phone]   Type of Clearance Requested:   - Medical ; NO MEDICATIONS LISTED AS NEEDING TO BE HELD; HOWEVER [facility] IS ASKING FOR DEVICE CLEARANCE PER SPEAKING WITH THE [facility] OFFICE THIS MORNING; I WILL BRING THE FORM THAT WAS FAXED TO OUR OFFICE TO OUR DEVICE RN   Type of Anesthesia:  Local ; POSSIBLE LAUGHING GAS MAY BE USED   Additional requests/questions:    Jiles Prows   04/06/2022, 9:28 AM

## 2022-05-09 ENCOUNTER — Other Ambulatory Visit: Payer: Self-pay | Admitting: *Deleted

## 2022-05-09 ENCOUNTER — Other Ambulatory Visit: Payer: Self-pay | Admitting: Internal Medicine

## 2022-07-03 ENCOUNTER — Other Ambulatory Visit: Payer: Self-pay | Admitting: Internal Medicine

## 2022-08-03 ENCOUNTER — Ambulatory Visit: Payer: Medicare Other | Attending: Internal Medicine

## 2022-08-07 ENCOUNTER — Other Ambulatory Visit: Payer: Self-pay | Admitting: Internal Medicine

## 2022-08-22 ENCOUNTER — Other Ambulatory Visit: Payer: Self-pay | Admitting: Internal Medicine

## 2022-10-09 ENCOUNTER — Other Ambulatory Visit: Payer: Self-pay | Admitting: Internal Medicine

## 2022-12-27 ENCOUNTER — Other Ambulatory Visit: Payer: Self-pay | Admitting: Internal Medicine

## 2022-12-27 DIAGNOSIS — F17200 Nicotine dependence, unspecified, uncomplicated: Secondary | ICD-10-CM

## 2023-01-04 ENCOUNTER — Ambulatory Visit: Payer: Medicare Other | Admitting: Podiatry

## 2023-01-04 ENCOUNTER — Encounter: Payer: Self-pay | Admitting: Podiatry

## 2023-01-04 DIAGNOSIS — M21621 Bunionette of right foot: Secondary | ICD-10-CM

## 2023-01-04 DIAGNOSIS — M21622 Bunionette of left foot: Secondary | ICD-10-CM | POA: Diagnosis not present

## 2023-01-04 DIAGNOSIS — M7751 Other enthesopathy of right foot: Secondary | ICD-10-CM | POA: Diagnosis not present

## 2023-01-04 DIAGNOSIS — M7752 Other enthesopathy of left foot: Secondary | ICD-10-CM

## 2023-01-04 MED ORDER — TRIAMCINOLONE ACETONIDE 10 MG/ML IJ SUSP
10.0000 mg | Freq: Once | INTRAMUSCULAR | Status: AC
  Administered 2023-01-04: 10 mg via INTRA_ARTICULAR

## 2023-01-04 NOTE — Progress Notes (Signed)
Subjective:   Patient ID: Carol Bright, female   DOB: 89 y.o.   MRN: [id number]   HPI Patient presents with caregiver with painful lesions around the fifth metatarsal of both feet that are making it hard to walk.  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       Objective:  Physical Exam Vitals and nursing note reviewed.  Constitutional:      Appearance: She is well-developed.  Pulmonary:     Effort: Pulmonary effort is normal.  Musculoskeletal:        General: Normal range of motion.  Skin:    General: Skin is warm.  Neurological:     Mental Status: She is alert.     Neurovascular status was found to be intact muscle strength adequate range of motion adequate with patient found to have moderate diminishment of forefoot strength.  Patient has significant fluid buildup around the fifth metatarsal head bilateral with prominence of the metatarsals and keratotic lesions which have formed secondarily.  Good digital perfusion and is oriented well     Assessment:  Inflammatory capsulitis of the fifth MPJs of both feet with lesion formation painful     Plan:  H&P reviewed I went ahead today I did sterile prep injected the plantar capsule fifth MPJ bilateral 3 mg dexamethasone Kenalog 5 mg Xylocaine I did courtesy debridement of lesions advised on cushioned shoes reappoint to recheck as needed

## 2023-01-10 ENCOUNTER — Ambulatory Visit
Admission: RE | Admit: 2023-01-10 | Discharge: 2023-01-10 | Disposition: A | Discharge status: Home / Self Care | Payer: Medicare Other | Source: Ambulatory Visit | Attending: Internal Medicine | Admitting: Internal Medicine

## 2023-01-10 DIAGNOSIS — F17200 Nicotine dependence, unspecified, uncomplicated: Secondary | ICD-10-CM

## 2023-08-05 ENCOUNTER — Other Ambulatory Visit: Payer: Self-pay | Admitting: Internal Medicine

## 2023-08-27 ENCOUNTER — Other Ambulatory Visit: Payer: Self-pay | Admitting: Internal Medicine

## 2023-09-29 ENCOUNTER — Encounter (HOSPITAL_COMMUNITY): Payer: Self-pay | Admitting: *Deleted

## 2023-09-29 ENCOUNTER — Emergency Department (HOSPITAL_COMMUNITY)
Admission: EM | Admit: 2023-09-29 | Discharge: 2023-09-29 | Disposition: A | Discharge status: Home / Self Care | Attending: Emergency Medicine | Admitting: Emergency Medicine

## 2023-09-29 ENCOUNTER — Other Ambulatory Visit: Payer: Self-pay

## 2023-09-29 ENCOUNTER — Emergency Department (HOSPITAL_COMMUNITY)

## 2023-09-29 DIAGNOSIS — Z8673 Personal history of transient ischemic attack (TIA), and cerebral infarction without residual deficits: Secondary | ICD-10-CM | POA: Diagnosis not present

## 2023-09-29 DIAGNOSIS — Z79899 Other long term (current) drug therapy: Secondary | ICD-10-CM | POA: Diagnosis not present

## 2023-09-29 DIAGNOSIS — N3000 Acute cystitis without hematuria: Secondary | ICD-10-CM | POA: Insufficient documentation

## 2023-09-29 DIAGNOSIS — I10 Essential (primary) hypertension: Secondary | ICD-10-CM | POA: Diagnosis not present

## 2023-09-29 DIAGNOSIS — I2510 Atherosclerotic heart disease of native coronary artery without angina pectoris: Secondary | ICD-10-CM | POA: Insufficient documentation

## 2023-09-29 DIAGNOSIS — Z720 Tobacco use: Secondary | ICD-10-CM | POA: Diagnosis not present

## 2023-09-29 DIAGNOSIS — M545 Low back pain, unspecified: Secondary | ICD-10-CM | POA: Diagnosis not present

## 2023-09-29 DIAGNOSIS — R109 Unspecified abdominal pain: Secondary | ICD-10-CM | POA: Diagnosis present

## 2023-09-29 LAB — COMPREHENSIVE METABOLIC PANEL WITH GFR
ALT: 14 U/L (ref 0–44)
AST: 23 U/L (ref 15–41)
Albumin: 3.8 g/dL (ref 3.5–5.0)
Alkaline Phosphatase: 82 U/L (ref 38–126)
Anion gap: 11 (ref 5–15)
BUN: 10 mg/dL (ref 8–23)
CO2: 22 mmol/L (ref 22–32)
Calcium: 9.1 mg/dL (ref 8.9–10.3)
Chloride: 109 mmol/L (ref 98–111)
Creatinine, Ser: 0.79 mg/dL (ref 0.44–1.00)
GFR, Estimated: >60 mL/min (ref >60)
Glucose, Bld: 126 mg/dL — ABNORMAL HIGH (ref 70–99)
Potassium: 3.8 mmol/L (ref 3.5–5.1)
Sodium: 142 mmol/L (ref 135–145)
Total Bilirubin: 0.6 mg/dL (ref 0.0–1.2)
Total Protein: 7.5 g/dL (ref 6.5–8.1)

## 2023-09-29 LAB — URINALYSIS, ROUTINE W REFLEX MICROSCOPIC
Bacteria, UA: NONE SEEN
Bilirubin Urine: NEGATIVE
Glucose, UA: NEGATIVE mg/dL
Ketones, ur: NEGATIVE mg/dL
Nitrite: NEGATIVE
Protein, ur: NEGATIVE mg/dL
Specific Gravity, Urine: 1.011 (ref 1.005–1.030)
pH: 6 (ref 5.0–8.0)

## 2023-09-29 LAB — I-STAT CHEM 8, ED
BUN: 11 mg/dL (ref 8–23)
Calcium, Ion: 1.16 mmol/L (ref 1.15–1.40)
Chloride: 109 mmol/L (ref 98–111)
Creatinine, Ser: 0.8 mg/dL (ref 0.44–1.00)
Glucose, Bld: 123 mg/dL — ABNORMAL HIGH (ref 70–99)
HCT: 36 % (ref 36.0–46.0)
Hemoglobin: 12.2 g/dL (ref 12.0–15.0)
Potassium: 3.7 mmol/L (ref 3.5–5.1)
Sodium: 143 mmol/L (ref 135–145)
TCO2: 21 mmol/L — ABNORMAL LOW (ref 22–32)

## 2023-09-29 LAB — CBC
HCT: 35 % — ABNORMAL LOW (ref 36.0–46.0)
Hemoglobin: 11.2 g/dL — ABNORMAL LOW (ref 12.0–15.0)
MCH: 31.3 pg (ref 26.0–34.0)
MCHC: 32 g/dL (ref 30.0–36.0)
MCV: 97.8 fL (ref 80.0–100.0)
Platelets: 264 K/uL (ref 150–400)
RBC: 3.58 MIL/uL — ABNORMAL LOW (ref 3.87–5.11)
RDW: 15.1 % (ref 11.5–15.5)
WBC: 5.7 K/uL (ref 4.0–10.5)
nRBC: 0 % (ref 0.0–0.2)

## 2023-09-29 LAB — LIPASE, BLOOD: Lipase: 32 U/L (ref 11–51)

## 2023-09-29 LAB — TROPONIN I (HIGH SENSITIVITY)
Troponin I (High Sensitivity): 5 ng/L (ref <18)
Troponin I (High Sensitivity): 5 ng/L (ref <18)

## 2023-09-29 MED ORDER — ONDANSETRON HCL 4 MG/2ML IJ SOLN
4.0000 mg | Freq: Once | INTRAMUSCULAR | Status: AC
  Administered 2023-09-29: 4 mg via INTRAVENOUS
  Filled 2023-09-29: qty 2

## 2023-09-29 MED ORDER — LIDOCAINE 5 % EX PTCH: 1.0000 | MEDICATED_PATCH | CUTANEOUS | 0 refills | Status: AC

## 2023-09-29 MED ORDER — IOHEXOL 350 MG/ML SOLN
75.0000 mL | Freq: Once | INTRAVENOUS | Status: AC | PRN
  Administered 2023-09-29: 75 mL via INTRAVENOUS

## 2023-09-29 MED ORDER — HYDROCODONE-ACETAMINOPHEN 5-325 MG PO TABS: 1.0000 | ORAL_TABLET | Freq: Four times a day (QID) | ORAL | 0 refills | Status: DC | PRN

## 2023-09-29 MED ORDER — FENTANYL CITRATE PF 50 MCG/ML IJ SOSY
50.0000 ug | PREFILLED_SYRINGE | Freq: Once | INTRAMUSCULAR | Status: AC
  Administered 2023-09-29: 50 ug via INTRAVENOUS
  Filled 2023-09-29: qty 1

## 2023-09-29 MED ORDER — CEFADROXIL 500 MG PO CAPS: 500.0000 mg | ORAL_CAPSULE | Freq: Two times a day (BID) | ORAL | 0 refills | Status: DC

## 2023-09-29 MED ORDER — CEFADROXIL 500 MG PO CAPS: 500.0000 mg | ORAL_CAPSULE | Freq: Two times a day (BID) | ORAL | 0 refills | Status: AC

## 2023-09-29 MED ORDER — OXYCODONE-ACETAMINOPHEN 5-325 MG PO TABS
1.0000 | ORAL_TABLET | Freq: Once | ORAL | Status: AC
  Administered 2023-09-29: 1 via ORAL

## 2023-09-29 MED ORDER — LIDOCAINE 5 % EX PTCH: 1.0000 | MEDICATED_PATCH | CUTANEOUS | 0 refills | Status: DC

## 2023-09-29 MED ORDER — SODIUM CHLORIDE 0.9 % IV SOLN
1.0000 g | Freq: Once | INTRAVENOUS | Status: AC
  Administered 2023-09-29: 1 g via INTRAVENOUS
  Filled 2023-09-29: qty 10

## 2023-09-29 MED ORDER — OXYCODONE-ACETAMINOPHEN 5-325 MG PO TABS
ORAL_TABLET | ORAL | Status: AC
  Filled 2023-09-29: qty 1

## 2023-09-29 NOTE — ED Notes (Signed)
 The pt had some difficulty swallowing the percocet  she reports that she has difficulty swallowing off and on but not a constant thing  she did get the percocet down

## 2023-09-29 NOTE — ED Notes (Signed)
 Pt states something is putting pressure on my back & it is causing me to have to urinate really bad. Pt states urinating doesn't relieve the pain. EDP made aware via secure chat.

## 2023-09-29 NOTE — Discharge Instructions (Addendum)
 Take the medications as needed to help with your pain.  You can also supplement with over-the-counter medications.  Take the antibiotics to treat the urinary tract infection.  Follow-up with your doctor to be rechecked next week to make sure you are improving

## 2023-09-29 NOTE — ED Provider Notes (Signed)
 [place] EMERGENCY DEPARTMENT AT [facility] Provider Note   CSN: [id number] Arrival date & time: 09/29/23  1534     Patient presents with: Back Pain and Chest Pain   Carol Bright is a 89 y.o. female.    Back Pain Associated symptoms: chest pain   Chest Pain Associated symptoms: back pain      Patient has history of coronary artery disease, tobacco use, back pain, atrial fibrillation, hypertension, TIA.  Patient presents ED with complaints of back chest abdominal pain.  Patient initially was having pain in her lower back for couple of days she has been trying some topical medications without relief.  Patient states she then started having pain throughout her abdomen and even moved up into her chest.  She denies any vomiting diarrhea constipation.  She has had urinary frequency however.  No fevers or chills  Prior to Admission medications   Medication Sig Start Date End Date Taking? Authorizing Provider  cefadroxil  (DURICEF) 500 MG capsule Take 1 capsule (500 mg total) by mouth 2 (two) times daily for 7 days. 09/29/23 10/06/23 Yes Randol Simmonds, MD  HYDROcodone -acetaminophen  (NORCO/VICODIN) 5-325 MG tablet Take 1 tablet by mouth every 6 (six) hours as needed. 09/29/23  Yes Randol Simmonds, MD  lidocaine  (LIDODERM ) 5 % Place 1 patch onto the skin daily. Remove & Discard patch within 12 hours or as directed by MD 09/29/23  Yes Randol Simmonds, MD  amLODipine  (NORVASC ) 5 MG tablet TAKE 1 TABLET(5 MG) BY MOUTH IN THE MORNING AND AT BEDTIME 08/28/23   Waddell Danelle ORN, MD  cetirizine (ZYRTEC) 10 MG tablet Take 10 mg by mouth daily as needed for allergies (allergies).     [provider]  famotidine  (PEPCID ) 10 MG tablet Take 10 mg by mouth 2 (two) times daily.    [provider]  folic acid  (FOLVITE ) 1 MG tablet Take 2 mg by mouth daily.    [provider]  hydrocortisone  (ANUSOL -HC) 2.5 % rectal cream Place 1 application rectally at bedtime. 10/07/19   Abran Norleen SAILOR, MD   Lidocaine -Menthol (1ST RELIEF SPRAY EX) Apply topically 4 (four) times daily as needed.    [provider]  losartan  (COZAAR ) 25 MG tablet TAKE 1 TABLET(25 MG) BY MOUTH DAILY 10/10/22   Okey Vina GAILS, MD  methotrexate  (RHEUMATREX) 2.5 MG tablet Take 25 mg by mouth once a week. Takes 10 tablets on Sunday. Caution:Chemotherapy. Protect from light.    [provider]  metoprolol  succinate (TOPROL -XL) 50 MG 24 hr tablet Take 1 tablet (50 mg total) by mouth daily. Take with or immediately following a meal. 02/23/21   Okey Vina GAILS, MD  polyethylene glycol (MIRALAX  / GLYCOLAX ) packet Take 17 g by mouth every other day.    [provider]  predniSONE  (DELTASONE ) 1 MG tablet Take 3 mg by mouth daily. 06/18/18   [provider]  rosuvastatin  (CRESTOR ) 10 MG tablet TAKE 1 TABLET(10 MG) BY MOUTH DAILY 08/06/23   Ross, Paula V, MD  sulfaSALAzine (AZULFIDINE) 500 MG tablet Take 500 mg by mouth 2 (two) times daily. 09/12/19   [provider]  timolol  (TIMOPTIC ) 0.5 % ophthalmic solution Place 1 drop into both eyes 2 (two) times daily. 08/03/16   [provider]  Vitamin D, Ergocalciferol, (DRISDOL) 1.25 MG (50000 UNIT) CAPS capsule Take 50,000 Units by mouth once a week. 09/26/19   [provider]    Allergies: Morphine , Morphine  and codeine, Penicillins, and Penicillin g  Review of Systems  Cardiovascular:  Positive for chest pain.  Musculoskeletal:  Positive for back pain.    Updated Vital Signs BP (!) 174/79 (BP Location: Right Arm)   Pulse 74   Temp 97.9 F (36.6 C)   Resp 18   Ht 1.499 m (4' 11)   Wt 57.2 kg   SpO2 100%   BMI 25.47 kg/m   Physical Exam Vitals and nursing note reviewed.  Constitutional:      Appearance: She is well-developed. She is not diaphoretic.  HENT:     Head: Normocephalic and atraumatic.     Right Ear: External ear normal.     Left Ear: External ear normal.  Eyes:     General: No scleral icterus.        Right eye: No discharge.        Left eye: No discharge.     Conjunctiva/sclera: Conjunctivae normal.  Neck:     Trachea: No tracheal deviation.  Cardiovascular:     Rate and Rhythm: Normal rate and regular rhythm.     Pulses:          Dorsalis pedis pulses are 2+ on the right side and 2+ on the left side.  Pulmonary:     Effort: Pulmonary effort is normal. No respiratory distress.     Breath sounds: Normal breath sounds. No stridor. No wheezing or rales.  Abdominal:     General: Bowel sounds are normal. There is no distension.     Palpations: Abdomen is soft.     Tenderness: There is generalized abdominal tenderness. There is no guarding or rebound.  Musculoskeletal:        General: No deformity.     Cervical back: Neck supple.     Thoracic back: No deformity or tenderness.     Lumbar back: Tenderness present. No deformity.  Skin:    General: Skin is warm and dry.     Findings: No rash.  Neurological:     General: No focal deficit present.     Mental Status: She is alert.     Cranial Nerves: No cranial nerve deficit, dysarthria or facial asymmetry.     Sensory: No sensory deficit.     Motor: No abnormal muscle tone or seizure activity.     Coordination: Coordination normal.  Psychiatric:        Mood and Affect: Mood normal.     (all labs ordered are listed, but only abnormal results are displayed) Labs Reviewed  COMPREHENSIVE METABOLIC PANEL WITH GFR - Abnormal; Notable for the following components:      Result Value   Glucose, Bld 126 (*)    All other components within normal limits  CBC - Abnormal; Notable for the following components:   RBC 3.58 (*)    Hemoglobin 11.2 (*)    HCT 35.0 (*)    All other components within normal limits  URINALYSIS, ROUTINE W REFLEX MICROSCOPIC - Abnormal; Notable for the following components:   Hgb urine dipstick SMALL (*)    Leukocytes,Ua LARGE (*)    All other components within normal limits  I-STAT CHEM 8, ED - Abnormal; Notable  for the following components:   Glucose, Bld 123 (*)    TCO2 21 (*)    All other components within normal limits  LIPASE, BLOOD  TROPONIN I (HIGH SENSITIVITY)  TROPONIN I (HIGH SENSITIVITY)    EKG: EKG Interpretation Date/Time:  Saturday September 29 2023 15:56:18 EDT Ventricular Rate:  60 PR Interval:  256 QRS Duration:  86 QT Interval:  382 QTC Calculation: 382 R Axis:   67  Text Interpretation: Atrial-paced rhythm with prolonged AV conduction Abnormal ECG When compared with ECG of 12-Sep-2021 09:16, No significant change since last tracing Confirmed by Randol Simmonds [phone]) on 09/29/2023 4:19:28 PM  Radiology: CT Angio Chest PE W and/or Wo Contrast Result Date: 09/29/2023 EXAM: CTA CHEST PE WITHOUT AND WITH CONTRAST CT ABDOMEN AND PELVIS WITH CONTRAST 09/29/2023 07:57:35 PM TECHNIQUE: CTA of the chest was performed after the administration of intravenous contrast. Multiplanar reformatted images are provided for review. MIP images are provided for review. CT of the abdomen and pelvis was performed with the administration of intravenous contrast. Automated exposure control, iterative reconstruction, and/or weight based adjustment of the mA/kV was utilized to reduce the radiation dose to as low as reasonably achievable. COMPARISON: CT 01/09/2022 and CT abdomen and pelvis 09/03/2020. CLINICAL HISTORY: Pulmonary embolism (PE) suspected, high prob. Back pain, chest pain, and abdominal pain. FINDINGS: CHEST: PULMONARY ARTERIES: Pulmonary arteries are adequately opacified for evaluation. No intraluminal filling defect to suggest pulmonary embolism. Main pulmonary artery is normal in caliber. MEDIASTINUM: No mediastinal lymphadenopathy. No pericardial effusion. Coronary artery and aortic atherosclerotic calcifications. The heart and pericardium demonstrate no acute abnormality. There is no acute abnormality of the thoracic aorta. LUNGS AND PLEURA: Ground glass nodules in the right middle lobe are stable  dating back to 04/17/2016. No follow-up recommended. Lungs are otherwise clear. No pleural effusion or pneumothorax. SOFT TISSUES AND BONES: Right chest wall pacemaker. Degenerative spondylosis with fusion of anterior osteophytes in the mid thoracic spine. No acute fracture. ABDOMEN AND PELVIS: LIVER: The liver is unremarkable. GALLBLADDER AND BILE DUCTS: Gallbladder is unremarkable. No biliary ductal dilatation. SPLEEN: Spleen demonstrates no acute abnormality. PANCREAS: Pancreas demonstrates no acute abnormality. ADRENAL GLANDS: Adrenal glands demonstrate no acute abnormality. KIDNEYS, URETERS AND BLADDER: No stones in the kidneys or ureters. No hydronephrosis. No perinephric or periureteral stranding. Urinary bladder is unremarkable. GI AND BOWEL: Stomach and duodenal sweep demonstrate no acute abnormality. There is no bowel obstruction. No abnormal bowel wall thickening or distension. Normal appendix. Colonic diverticulosis without evidence of diverticulitis. REPRODUCTIVE: Hysterectomy. PERITONEUM AND RETROPERITONEUM: No ascites or free air. LYMPH NODES: No lymphadenopathy. BONES AND SOFT TISSUES: No acute abnormality of the visualized bones. No focal soft tissue abnormality. VASCULATURE: Advanced aortic atherosclerotic calcification in the abdominal aorta. No aortic aneurysm. 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 IMPRESSION: 1. No pulmonary embolism. 2. No acute abnormality in the chest, abdomen, or pelvis. 3. 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 This is favored chronic given appearance CT abdomen and pelvis 09/03/2020. The external iliac and proximal outflow arteries are opacified. Electronically signed by: Norman Gatlin MD 09/29/2023 08:20 PM EDT RP Workstation: [id number]   CT ABDOMEN PELVIS W CONTRAST Result Date: 09/29/2023 EXAM: CTA CHEST PE WITHOUT AND WITH CONTRAST CT  ABDOMEN AND PELVIS WITH CONTRAST 09/29/2023 07:57:35 PM TECHNIQUE: CTA of the chest was performed after the administration of intravenous contrast. Multiplanar reformatted images are provided for review. MIP images are provided for review. CT of the abdomen and pelvis was performed with the administration of intravenous contrast. Automated exposure control, iterative reconstruction, and/or weight based adjustment of the mA/kV was utilized to reduce the radiation dose to as low as reasonably achievable. COMPARISON: CT 01/09/2022 and CT abdomen and pelvis 09/03/2020. CLINICAL HISTORY: Pulmonary embolism (PE) suspected, high prob. Back pain, chest  pain, and abdominal pain. FINDINGS: CHEST: PULMONARY ARTERIES: Pulmonary arteries are adequately opacified for evaluation. No intraluminal filling defect to suggest pulmonary embolism. Main pulmonary artery is normal in caliber. MEDIASTINUM: No mediastinal lymphadenopathy. No pericardial effusion. Coronary artery and aortic atherosclerotic calcifications. The heart and pericardium demonstrate no acute abnormality. There is no acute abnormality of the thoracic aorta. LUNGS AND PLEURA: Ground glass nodules in the right middle lobe are stable dating back to 04/17/2016. No follow-up recommended. Lungs are otherwise clear. No pleural effusion or pneumothorax. SOFT TISSUES AND BONES: Right chest wall pacemaker. Degenerative spondylosis with fusion of anterior osteophytes in the mid thoracic spine. No acute fracture. ABDOMEN AND PELVIS: LIVER: The liver is unremarkable. GALLBLADDER AND BILE DUCTS: Gallbladder is unremarkable. No biliary ductal dilatation. SPLEEN: Spleen demonstrates no acute abnormality. PANCREAS: Pancreas demonstrates no acute abnormality. ADRENAL GLANDS: Adrenal glands demonstrate no acute abnormality. KIDNEYS, URETERS AND BLADDER: No stones in the kidneys or ureters. No hydronephrosis. No perinephric or periureteral stranding. Urinary bladder is unremarkable. GI  AND BOWEL: Stomach and duodenal sweep demonstrate no acute abnormality. There is no bowel obstruction. No abnormal bowel wall thickening or distension. Normal appendix. Colonic diverticulosis without evidence of diverticulitis. REPRODUCTIVE: Hysterectomy. PERITONEUM AND RETROPERITONEUM: No ascites or free air. LYMPH NODES: No lymphadenopathy. BONES AND SOFT TISSUES: No acute abnormality of the visualized bones. No focal soft tissue abnormality. VASCULATURE: Advanced aortic atherosclerotic calcification in the abdominal aorta. No aortic aneurysm. 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 IMPRESSION: 1. No pulmonary embolism. 2. No acute abnormality in the chest, abdomen, or pelvis. 3. 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 This is favored chronic given appearance CT abdomen and pelvis 09/03/2020. The external iliac and proximal outflow arteries are opacified. Electronically signed by: Norman Gatlin MD 09/29/2023 08:20 PM EDT RP Workstation: [id number]     Procedures   Medications Ordered in the ED  cefTRIAXone  (ROCEPHIN ) 1 g in sodium chloride  0.9 % 100 mL IVPB (1 g Intravenous New Bag/Given 09/29/23 2047)  oxyCODONE -acetaminophen  (PERCOCET/ROXICET) 5-325 MG per tablet 1 tablet (1 tablet Oral Given 09/29/23 1555)  ondansetron  (ZOFRAN ) injection 4 mg (4 mg Intravenous Given 09/29/23 1653)  fentaNYL  (SUBLIMAZE ) injection 50 mcg (50 mcg Intravenous Given 09/29/23 1654)  iohexol  (OMNIPAQUE ) 350 MG/ML injection 75 mL (75 mLs Intravenous Contrast Given 09/29/23 1958)    Clinical Course as of 09/29/23 2105  Sat Sep 29, 2023  1741 Urinalysis, Routine w reflex microscopic -Urine, Clean Catch(!) Ua and bacteria noted  [JK]  1741 Comprehensive metabolic panel(!) nl [JK]  2021 Prior notes reviewed.  Patient has tolerated Ceftin in the give her a dose of antibiotics for the possible UTI [JK]  2032  CT of chest does not show any evidence of PE.  Otherwise no acute abnormality in the chest abdomen or pelvis.  Severe stenosis and possible occlusion of the left common iliac artery appears to be chronic.  Patient does not have any evidence of lower extremity arterial ischemia [JK]    Clinical Course User Index [JK] Randol Simmonds, MD                                 Medical Decision Making Problems Addressed: Acute cystitis without hematuria: acute illness or injury that poses a threat to life or bodily functions Low back pain without sciatica, unspecified back pain laterality, unspecified chronicity: chronic illness or  injury with exacerbation, progression, or side effects of treatment  Amount and/or Complexity of Data Reviewed Labs: ordered. Decision-making details documented in ED Course. Radiology: ordered and independent interpretation performed.  Risk Prescription drug management.   Patient presented to the ED for evaluation of back pain.  Patient states the pain then started moving up into her chest and abdomen area.  Patient's ED workup does not show any evidence of hepatitis or pancreatitis.  CT scan of her abdomen pelvis does not show any signs of diverticulitis colitis or obstruction.  No ureteral lithiasis.  Cardiac workup is reassuring.  No signs of ischemia.  CT angio does not show any PE or other acute abnormality.  CT scan of the abdomen did show occlusion of the left common iliac artery.  This appears to be chronic in nature.  Patient does have pulses in her lower extremity and is not complaining of any symptoms to suggest acute vascular compromise.  Patient has been having some urinary symptoms.  Her urinalysis is abnormal.  She does appear to have a UTI.  It is possible she could have a component of pyelonephritis but I suspect this back pain is more likely musculoskeletal in nature.  Will discharge home with course of antibiotics.  Evaluation and diagnostic testing in the  emergency department does not suggest an emergent condition requiring admission or immediate intervention beyond what has been performed at this time.  The patient is safe for discharge and has been instructed to return immediately for worsening symptoms, change in symptoms or any other concerns.     Final diagnoses:  Acute cystitis without hematuria  Low back pain without sciatica, unspecified back pain laterality, unspecified chronicity    ED Discharge Orders          Ordered    cefadroxil  (DURICEF) 500 MG capsule  2 times daily        09/29/23 2103    HYDROcodone -acetaminophen  (NORCO/VICODIN) 5-325 MG tablet  Every 6 hours PRN        09/29/23 2104    lidocaine  (LIDODERM ) 5 %  Every 24 hours        09/29/23 2105               Randol Simmonds, MD 09/29/23 2105

## 2023-09-29 NOTE — ED Triage Notes (Addendum)
 Patient reports back pain yesterday, worse at 0300, chest pain at 0900. Patient states she kept taking her meds hoping it would go away but it didn't work. She took prednisone , tylenol , blood pressure pill, and zyrtec.

## 2023-10-09 ENCOUNTER — Other Ambulatory Visit: Payer: Self-pay | Admitting: Physical Medicine and Rehabilitation

## 2023-10-09 DIAGNOSIS — M5416 Radiculopathy, lumbar region: Secondary | ICD-10-CM

## 2023-11-02 ENCOUNTER — Other Ambulatory Visit: Payer: Self-pay | Admitting: *Deleted

## 2023-11-02 DIAGNOSIS — I771 Stricture of artery: Secondary | ICD-10-CM

## 2023-11-08 ENCOUNTER — Other Ambulatory Visit: Payer: Self-pay

## 2023-11-08 ENCOUNTER — Ambulatory Visit: Admitting: Physical Medicine and Rehabilitation

## 2023-11-08 VITALS — BP 172/84 | HR 59

## 2023-11-08 DIAGNOSIS — M5416 Radiculopathy, lumbar region: Secondary | ICD-10-CM | POA: Diagnosis not present

## 2023-11-08 MED ORDER — METHYLPREDNISOLONE ACETATE 40 MG/ML IJ SUSP
40.0000 mg | Freq: Once | INTRAMUSCULAR | Status: AC
  Administered 2023-11-08: 40 mg

## 2023-11-08 NOTE — Progress Notes (Signed)
 Pain Scale   Average Pain 7 Patient advising she has lower back pain radiating to left leg pain is constant.        +Driver, -BT, -Dye Allergies.

## 2023-11-12 NOTE — Progress Notes (Signed)
 Rogers [name] - 89 y.o. female MRN [id number]  Date of birth: 1930-03-31  Office Visit Note: Visit Date: 11/08/2023 PCP: Yolande Toribio MATSU, MD Referred by: Yolande Toribio MATSU, MD  Subjective: Chief Complaint  Patient presents with   Lower Back - Pain   HPI:  Carol Bright is a 89 y.o. female who comes in today at the request of Duwaine Pouch, FNP for planned Left L4-5 Lumbar Interlaminar epidural steroid injection with fluoroscopic guidance.  The patient has failed conservative care including home exercise, medications, time and activity modification.  This injection will be diagnostic and hopefully therapeutic.  Please see requesting physician notes for further details and justification.   ROS Otherwise per HPI.  Assessment & Plan: Visit Diagnoses:    ICD-10-CM   1. Lumbar radiculopathy  M54.16 XR C-ARM NO REPORT    Epidural Steroid injection    methylPREDNISolone  acetate (DEPO-MEDROL ) injection 40 mg      Plan: No additional findings.   Meds & Orders:  Meds ordered this encounter  Medications   methylPREDNISolone  acetate (DEPO-MEDROL ) injection 40 mg    Orders Placed This Encounter  Procedures   XR C-ARM NO REPORT   Epidural Steroid injection    Follow-up: Return for visit to requesting provider as needed.   Procedures: No procedures performed  Lumbar Epidural Steroid Injection - Interlaminar Approach with Fluoroscopic Guidance  Patient: Carol Bright      Date of Birth: Jul 10, 1930 MRN: [id number] PCP: Yolande Toribio MATSU, MD      Visit Date: 11/08/2023   Universal Protocol:     Consent Given By: the patient  Position: PRONE  Additional Comments: Vital signs were monitored before and after the procedure. Patient was prepped and draped in the usual sterile fashion. The correct patient, procedure, and site was verified.   Injection Procedure Details:   Procedure diagnoses: Lumbar radiculopathy [M54.16]   Meds Administered:  Meds ordered this  encounter  Medications   methylPREDNISolone  acetate (DEPO-MEDROL ) injection 40 mg     Laterality: Left  Location/Site:  L4-5  Needle: 3.5 in., 20 ga. Tuohy  Needle Placement: Paramedian epidural  Findings:   -Comments: Excellent flow of contrast into the epidural space.  Procedure Details: Using a paramedian approach from the side mentioned above, the region overlying the inferior lamina was localized under fluoroscopic visualization and the soft tissues overlying this structure were infiltrated with 4 ml. of 1% Lidocaine  without Epinephrine. The Tuohy needle was inserted into the epidural space using a paramedian approach.   The epidural space was localized using loss of resistance along with counter oblique bi-planar fluoroscopic views.  After negative aspirate for air, blood, and CSF, a 2 ml. volume of Isovue -250 was injected into the epidural space and the flow of contrast was observed. Radiographs were obtained for documentation purposes.    The injectate was administered into the level noted above.   Additional Comments:  The patient tolerated the procedure well Dressing: 2 x 2 sterile gauze and Band-Aid    Post-procedure details: Patient was observed during the procedure. Post-procedure instructions were reviewed.  Patient left the clinic in stable condition.   Clinical History: No specialty comments available.     Objective:  VS:  HT:    WT:   BMI:     BP:(!) 172/84  HR:(!) 59bpm  TEMP: ( )  RESP:  Physical Exam Vitals and nursing note reviewed.  Constitutional:      General: She is not in acute distress.  Appearance: Normal appearance. She is not ill-appearing.  HENT:     Head: Normocephalic and atraumatic.     Right Ear: External ear normal.     Left Ear: External ear normal.  Eyes:     Extraocular Movements: Extraocular movements intact.  Cardiovascular:     Rate and Rhythm: Normal rate.     Pulses: Normal pulses.  Pulmonary:     Effort:  Pulmonary effort is normal. No respiratory distress.  Abdominal:     General: There is no distension.     Palpations: Abdomen is soft.  Musculoskeletal:        General: Tenderness present.     Cervical back: Neck supple.     Right lower leg: No edema.     Left lower leg: No edema.     Comments: Patient has good distal strength with no pain over the greater trochanters.  No clonus or focal weakness.  Skin:    Findings: No erythema, lesion or rash.  Neurological:     General: No focal deficit present.     Mental Status: She is alert and oriented to person, place, and time.     Sensory: No sensory deficit.     Motor: No weakness or abnormal muscle tone.     Coordination: Coordination normal.  Psychiatric:        Mood and Affect: Mood normal.        Behavior: Behavior normal.      Imaging: No results found.

## 2023-11-12 NOTE — Procedures (Signed)
 Lumbar Epidural Steroid Injection - Interlaminar Approach with Fluoroscopic Guidance  Patient: Carol Bright      Date of Birth: July 10, 1930 MRN: [id number] PCP: Yolande Toribio MATSU, MD      Visit Date: 11/08/2023   Universal Protocol:     Consent Given By: the patient  Position: PRONE  Additional Comments: Vital signs were monitored before and after the procedure. Patient was prepped and draped in the usual sterile fashion. The correct patient, procedure, and site was verified.   Injection Procedure Details:   Procedure diagnoses: Lumbar radiculopathy [M54.16]   Meds Administered:  Meds ordered this encounter  Medications   methylPREDNISolone  acetate (DEPO-MEDROL ) injection 40 mg     Laterality: Left  Location/Site:  L4-5  Needle: 3.5 in., 20 ga. Tuohy  Needle Placement: Paramedian epidural  Findings:   -Comments: Excellent flow of contrast into the epidural space.  Procedure Details: Using a paramedian approach from the side mentioned above, the region overlying the inferior lamina was localized under fluoroscopic visualization and the soft tissues overlying this structure were infiltrated with 4 ml. of 1% Lidocaine  without Epinephrine. The Tuohy needle was inserted into the epidural space using a paramedian approach.   The epidural space was localized using loss of resistance along with counter oblique bi-planar fluoroscopic views.  After negative aspirate for air, blood, and CSF, a 2 ml. volume of Isovue -250 was injected into the epidural space and the flow of contrast was observed. Radiographs were obtained for documentation purposes.    The injectate was administered into the level noted above.   Additional Comments:  The patient tolerated the procedure well Dressing: 2 x 2 sterile gauze and Band-Aid    Post-procedure details: Patient was observed during the procedure. Post-procedure instructions were reviewed.  Patient left the clinic in stable  condition.

## 2023-11-15 NOTE — Progress Notes (Unsigned)
 Patient ID: Carol Bright, female   DOB: 1930-06-20, 89 y.o.   MRN: [id number]  Reason for Consult: No chief complaint on file.   Referred by Angelia Pierce, NP  Subjective:     HPI Carol Bright is a 89 y.o. female presenting to discuss findings of her recent CT scan.  She presented to the emergency department on 09/29/2023 with a couple day history of chest and low back pain.  A CT scan was obtained which demonstrated atherosclerotic disease with stenosis of the left iliac.  She was found to have a UTI and discharged from the ED on antibiotics. She denies lower extremity pain with walking.  She denies rest pain or slow/nonhealing.  Past Medical History:  Diagnosis Date   Borderline diabetes    Breast cancer (HCC) 1996   BREAST CANCER -LEFT BREAST REMOVED -  1996   Cataract    Bil   Daily headache    recently (02/06/2012)   Diverticulosis    Dyspnea on exertion    off and on for years; just worse recently (02/06/2012)   External hemorrhoids    GERD (gastroesophageal reflux disease)    Guaiac + stool 2005   went to Dr. Abran; took out polyps (02/06/2012)   H/O hiatal hernia    History of bronchitis    occasionally (02/06/2012)   Hyperlipidemia    Hypertension    Orthostatic lightheadedness    Palpitations    Rheumatoid arthritis(714.0)    PT IS FOLLOWED BY DR ANGEL  [facility]   Sinus bradycardia 02/04/2012   Syncope and collapse 02/04/2012   first time ever (02/06/2012)   Family History  Problem Relation Age of Onset   Cancer Mother        LEFT BREAST REMOVED   Heart attack Mother    Heart disease Mother    Breast cancer Mother    Past Surgical History:  Procedure Laterality Date   CARDIAC CATHETERIZATION  02/05/2012   first one ever (02/06/2012)   COLONOSCOPY  11/2011   CYSTECTOMY  1993   Left shoulder   EYE SURGERY Right    LEFT AND RIGHT HEART CATHETERIZATION WITH CORONARY ANGIOGRAM N/A 02/05/2012   Procedure: LEFT AND RIGHT HEART  CATHETERIZATION WITH CORONARY ANGIOGRAM;  Surgeon: Ozell Fell, MD;  Location: [facility] CATH LAB;  Service: Cardiovascular;  Laterality: N/A;   MASTECTOMY  1995   Left   PERMANENT PACEMAKER INSERTION N/A 02/07/2012   Procedure: PERMANENT PACEMAKER INSERTION;  Surgeon: Danelle LELON Birmingham, MD;  Location: [facility] CATH LAB;  Service: Cardiovascular;  Laterality: N/A;   POLYPECTOMY  ~2005; ~2008   TONSILLECTOMY  1950's   VAGINAL HYSTERECTOMY  1970    Short Social History:  Social History   Tobacco Use   Smoking status: Every Day    Current packs/day: 0.50    Average packs/day: 0.5 packs/day for 50.0 years (25.0 ttl pk-yrs)    Types: Cigarettes   Smokeless tobacco: Never   Tobacco comments:    02/06/2012 'stopped smoking 4-5 times; offered smoking cessation materials; pt declines  Substance Use Topics   Alcohol use: Yes    Alcohol/week: 0.0 standard drinks of alcohol    Comment: 02/06/2012 occasional; like a holiday    Allergies  Allergen Reactions   Morphine  Anaphylaxis   Morphine  And Codeine     Thought she had a stroke after taking this medication. Also couldn't talk.   Penicillins Hives   Penicillin G Rash    Current Outpatient Medications  Medication  Sig Dispense Refill   amLODipine  (NORVASC ) 5 MG tablet TAKE 1 TABLET(5 MG) BY MOUTH IN THE MORNING AND AT BEDTIME 180 tablet 1   cetirizine (ZYRTEC) 10 MG tablet Take 10 mg by mouth daily as needed for allergies (allergies).      famotidine  (PEPCID ) 10 MG tablet Take 10 mg by mouth 2 (two) times daily.     folic acid  (FOLVITE ) 1 MG tablet Take 2 mg by mouth daily.     HYDROcodone -acetaminophen  (NORCO/VICODIN) 5-325 MG tablet Take 1 tablet by mouth every 6 (six) hours as needed. 10 tablet 0   hydrocortisone  (ANUSOL -HC) 2.5 % rectal cream Place 1 application rectally at bedtime. 30 g 1   lidocaine  (LIDODERM ) 5 % Place 1 patch onto the skin daily. Remove & Discard patch within 12 hours or as directed by MD 10 patch 0   Lidocaine -Menthol  (1ST RELIEF SPRAY EX) Apply topically 4 (four) times daily as needed.     losartan  (COZAAR ) 25 MG tablet TAKE 1 TABLET(25 MG) BY MOUTH DAILY 15 tablet 0   methotrexate  (RHEUMATREX) 2.5 MG tablet Take 25 mg by mouth once a week. Takes 10 tablets on Sunday. Caution:Chemotherapy. Protect from light.     metoprolol  succinate (TOPROL -XL) 50 MG 24 hr tablet Take 1 tablet (50 mg total) by mouth daily. Take with or immediately following a meal. 90 tablet 3   polyethylene glycol (MIRALAX  / GLYCOLAX ) packet Take 17 g by mouth every other day.     predniSONE  (DELTASONE ) 1 MG tablet Take 3 mg by mouth daily.     rosuvastatin  (CRESTOR ) 10 MG tablet TAKE 1 TABLET(10 MG) BY MOUTH DAILY 90 tablet 2   sulfaSALAzine (AZULFIDINE) 500 MG tablet Take 500 mg by mouth 2 (two) times daily.     timolol  (TIMOPTIC ) 0.5 % ophthalmic solution Place 1 drop into both eyes 2 (two) times daily.     Vitamin D, Ergocalciferol, (DRISDOL) 1.25 MG (50000 UNIT) CAPS capsule Take 50,000 Units by mouth once a week.     No current facility-administered medications for this visit.    REVIEW OF SYSTEMS  All other systems were reviewed and are negative     Objective:  Objective   There were no vitals filed for this visit. There is no height or weight on file to calculate BMI.  Physical Exam General: no acute distress Abdomen: non-tender, no pulsatile mass*** Neuro: alert, no focal deficit Extremities: no edema, cyanosis or wounds*** Vascular:   Right: ***  Left: ***  Data: CT independently reviewed Atherosclerotic disease noted throughout aorta bilateral iliac systems.  There appears to be a severe stenosis of the left common iliac artery although evaluation is limited due to the scan not being arterial timed.  There is also moderate calcific disease of bilateral common femoral arteries.  ABI ***     Assessment/Plan:   Carol Bright is a 89 y.o. female with asymptomatic PAD.  I explained that there is no indication  for intervention for asymptomatic lower extremity arterial disease.  She is able to walk without claudication and does not have any symptoms of chronic limb-threatening ischemia.  Will plan to just continue with annual surveillance given her depressed ABI. Regarding other vascular screening, she did not have her carotid back in 2015 which demonstrated no flow limiting stenosis bilaterally and recent CT did not demonstrate any aortic aneurysm. Follow-up in 1 year with a repeat ABI   Norman GORMAN Serve MD [facility]

## 2023-11-16 ENCOUNTER — Ambulatory Visit (INDEPENDENT_AMBULATORY_CARE_PROVIDER_SITE_OTHER): Admitting: Vascular Surgery

## 2023-11-16 ENCOUNTER — Ambulatory Visit (HOSPITAL_COMMUNITY)
Admission: RE | Admit: 2023-11-16 | Discharge: 2023-11-16 | Disposition: A | Discharge status: Home / Self Care | Source: Ambulatory Visit | Attending: Vascular Surgery | Admitting: Vascular Surgery

## 2023-11-16 ENCOUNTER — Encounter: Payer: Self-pay | Admitting: Vascular Surgery

## 2023-11-16 VITALS — BP 156/62 | HR 60 | Temp 97.8°F

## 2023-11-16 DIAGNOSIS — I739 Peripheral vascular disease, unspecified: Secondary | ICD-10-CM

## 2023-11-16 DIAGNOSIS — I771 Stricture of artery: Secondary | ICD-10-CM | POA: Diagnosis present

## 2023-11-16 LAB — VAS US ABI WITH/WO TBI
Left ABI: 0.58
Right ABI: 0.93

## 2024-01-30 ENCOUNTER — Other Ambulatory Visit (HOSPITAL_COMMUNITY): Payer: Self-pay | Admitting: Internal Medicine

## 2024-01-30 DIAGNOSIS — R1310 Dysphagia, unspecified: Secondary | ICD-10-CM

## 2024-02-04 ENCOUNTER — Ambulatory Visit (HOSPITAL_COMMUNITY)
Admission: RE | Admit: 2024-02-04 | Discharge: 2024-02-04 | Disposition: A | Discharge status: Home / Self Care | Source: Ambulatory Visit | Attending: Internal Medicine | Admitting: Internal Medicine

## 2024-02-04 DIAGNOSIS — R1310 Dysphagia, unspecified: Secondary | ICD-10-CM | POA: Insufficient documentation

## 2024-02-21 ENCOUNTER — Telehealth: Payer: Self-pay | Admitting: Emergency Medicine

## 2024-02-21 ENCOUNTER — Ambulatory Visit: Attending: Physician Assistant | Admitting: Cardiology

## 2024-02-21 ENCOUNTER — Encounter: Payer: Self-pay | Admitting: Cardiology

## 2024-02-21 VITALS — BP 120/64 | HR 60 | Ht 59.0 in | Wt 120.0 lb

## 2024-02-21 DIAGNOSIS — I25709 Atherosclerosis of coronary artery bypass graft(s), unspecified, with unspecified angina pectoris: Secondary | ICD-10-CM | POA: Diagnosis not present

## 2024-02-21 DIAGNOSIS — Z950 Presence of cardiac pacemaker: Secondary | ICD-10-CM

## 2024-02-21 DIAGNOSIS — R079 Chest pain, unspecified: Secondary | ICD-10-CM | POA: Diagnosis not present

## 2024-02-21 DIAGNOSIS — I10 Essential (primary) hypertension: Secondary | ICD-10-CM

## 2024-02-21 DIAGNOSIS — R002 Palpitations: Secondary | ICD-10-CM

## 2024-02-21 DIAGNOSIS — I495 Sick sinus syndrome: Secondary | ICD-10-CM

## 2024-02-21 DIAGNOSIS — R42 Dizziness and giddiness: Secondary | ICD-10-CM | POA: Diagnosis not present

## 2024-02-21 DIAGNOSIS — I25701 Atherosclerosis of coronary artery bypass graft(s), unspecified, with angina pectoris with documented spasm: Secondary | ICD-10-CM

## 2024-02-21 MED ORDER — ISOSORBIDE MONONITRATE ER 30 MG PO TB24: 15.0000 mg | ORAL_TABLET | Freq: Every day | ORAL | 3 refills | Status: AC

## 2024-02-21 NOTE — H&P (View-Only) (Signed)
 " Electrophysiology Office Note:   ID:  [place], Harder [birth year], MRN [id number]  Primary Cardiologist: Vina Gull, MD Electrophysiologist: Donnice DELENA Primus, MD      History of Present Illness:   Carol Bright is a 89 y.o. female with h/o sinus node dysfunction s/p PPM, paroxysmal atrial fibrillation, PAD, non-obstructive CAD, hypertension, RA seen today for overdue electrophysiology followup.   Per note review today, patient was admitted to the hospital 03/15/13 with shortness of breath, chest pain, back pain, flank pain. While admitted, developed acute onset nausea following administration of IV morphine  and was subsequently noted to be unresponsive with left facial droop, fixed stare. Symptoms felt to be either TIA or seizure. Neurological workup in the hospital was reassuring with no intracranial hemorrhage, mass/lesion, or evidence of infarction. EEG was normal. Per notes from outpatient neurology followup, unlikely seizure, more suggestive of hypotensive/hypoperfusion episode secondary to morphine .   Patient's last office visit was 01/31/2022.  At that visit, it was noted that patient's [facility] scientific pacemaker was on advisory but given ~3.5 yrs battery estimated remaining and lack of dependence, gen change deferred with plans for close follow up. Unfortunately, patient was then lost to follow up and has not been seen since.   Since her last follow-up with our office, patient reports that she has become more frail with decreased mobility.  She is able to complete ADLs around the house but otherwise is minimally active citing leg weakness.  Regarding cardiac symptoms, patient denies sensation of palpitations or skipped heartbeats.  She reports chronic dizziness though denies presyncopal sensation. Today she states that over the summer she began to notice occasional nocturnal chest pressure and shortness of breath.  Has not found that she experiences this with exertion though is minimally  active.  Symptom frequency is relatively low, a few times a month.  Has not found anything that relieves her symptoms but states that they go away on their own within about 15 minutes.  Furthermore patient reports that over the past few months she has noticed sharp right sided neck pain.  Patient has not discussed this with any other provider.  She suspects this pain may be from sleeping in an unusual position and states that pain is reproducible with specific neck movements.  Patient was recently seen by ENT regarding hoarseness of her voice and difficulty swallowing, was referred by her primary care provider.  This visit note also indicates that patient has had facial and dental pain with swelling recently. There was a recommendation from ENT to seen dentist or oral surgeon.   Review of systems complete and found to be negative unless listed in HPI.   EP Information / Studies Reviewed:    EKG is ordered today. Personal review as below.  EKG Interpretation Date/Time:  Thursday February 21 2024 10:01:44 EST Ventricular Rate:  60 PR Interval:  248 QRS Duration:  80 QT Interval:  390 QTC Calculation: 390 R Axis:   72  Text Interpretation: Atrial-paced rhythm with prolonged AV conduction When compared with ECG of 29-Sep-2023 15:56, No significant change was found Confirmed by Trudy Birmingham [phone]) on 02/21/2024 10:04:06 AM    PPM Interrogation-  reviewed in detail today,  See PACEART report.  Arrhythmia/Device History PPM-[employer] [id number] Advantio implanted 02/07/2012  Physical Exam:   VS:  BP 120/64   Pulse 60   Ht 4' 11 (1.499 m)   Wt 120 lb (54.4 kg)   SpO2 98%   BMI 24.24 kg/m  Wt Readings from Last 3 Encounters:  02/21/24 120 lb (54.4 kg)  09/29/23 126 lb 1.7 oz (57.2 kg)  01/31/22 126 lb (57.2 kg)     GEN: No acute distress  NECK: No JVD; No carotid bruits appreciated CARDIAC: Regular rate and rhythm. Faint systolic murmur.  RESPIRATORY:  Clear to auscultation  without rales, wheezing or rhonchi  ABDOMEN: Soft, non-tender, non-distended EXTREMITIES:  No edema; No deformity   ASSESSMENT AND PLAN:    SND s/p [employer] PPM  Patient has a Environmental Manager [phone] Advantio implanted 02/07/2012.  This device is on advisory for early activation of safety mode, secondary to risk of abnormal battery behavior. Per recommendation from [employer], no device interrogation today given risk of activating safety mode. Given that 2 years ago, est battery life was ~3.21yrs, recommends generator change-out. Case reviewed with Dr. Almetta, scheduled 02/26/24. Check pre-procedure labs today. Letter and soap given. Discussed indication for procedure with patient as well as potential complications including but not limited to infection and bleeding.   Paroxysmal atrial fibrillation Prior office visit notes indicate that we have been monitoring patient's device for incidence of atrial fibrillation, though not clear to me if this is ever truly been documented in the past. AP/VS on ECG today. No interrogation, see above.  Non-obstructive CAD Patient with known significant vascular disease had LHC in 2014 showing non-obstructive CAD. Has been lost to follow up with cardiology for several years. Today reports developing nighttime chest pressure with dyspnea this summer, now experiences a few times per month. No exertional component though is minimally active with weakness. Symptoms are concerning for angina. EKG today without obvious ischemic changes. Discussed patient with DOD Dr. Wonda today, will start Imdur  15mg  and arrange general cardiology follow up. Could consider Myoview  stress testing.   Hypertension Blood pressure is well-controlled on current antihypertensive regimen. Continue current medications and dosing. Adding Imdur  15mg  as above.  Poor dentition Recent ENT notes from 1/16 document concerns for patient's poor dentition causing recent facial pain  and swelling. No systemic symptoms reported today. Does have right sided neck pain that could be referred. Of note, also on chronic prednisone  for RA. CBC drawn today. Will make Dr. Almetta aware.   Peripheral vascular disease Follows annually with vascular surgery.   Hyperlipidemia Continue statin.   Tobacco use Still smokes occasionally. Cessation strongly encouraged today.   Healthcare access limitation Patient reports significant challenge with access to transportation to her appointments. She is interested in speaking with LCSW, will place referral.      Disposition:   Follow up with EP Team as usual post procedure. Next available appointment with general cardiology to re-establish.   Signed, Artist Pouch, PA-C  "

## 2024-02-21 NOTE — Progress Notes (Addendum)
 " Electrophysiology Office Note:   ID:  Carol Bright, Carol Bright [birth year], MRN [id number]  Primary Cardiologist: Vina Gull, MD Electrophysiologist: Donnice DELENA Primus, MD      History of Present Illness:   Carol Bright is a 89 y.o. female with h/o sinus node dysfunction s/p PPM, paroxysmal atrial fibrillation, PAD, non-obstructive CAD, hypertension, RA seen today for overdue electrophysiology followup.   Per note review today, patient was admitted to the hospital 03/15/13 with shortness of breath, chest pain, back pain, flank pain. While admitted, developed acute onset nausea following administration of IV morphine  and was subsequently noted to be unresponsive with left facial droop, fixed stare. Symptoms felt to be either TIA or seizure. Neurological workup in the hospital was reassuring with no intracranial hemorrhage, mass/lesion, or evidence of infarction. EEG was normal. Per notes from outpatient neurology followup, unlikely seizure, more suggestive of hypotensive/hypoperfusion episode secondary to morphine .   Patient's last office visit was 01/31/2022.  At that visit, it was noted that patient's [facility] scientific pacemaker was on advisory but given ~3.5 yrs battery estimated remaining and lack of dependence, gen change deferred with plans for close follow up. Unfortunately, patient was then lost to follow up and has not been seen since.   Since her last follow-up with our office, patient reports that she has become more frail with decreased mobility.  She is able to complete ADLs around the house but otherwise is minimally active citing leg weakness.  Regarding cardiac symptoms, patient denies sensation of palpitations or skipped heartbeats.  She reports chronic dizziness though denies presyncopal sensation. Today she states that over the summer she began to notice occasional nocturnal chest pressure and shortness of breath.  Has not found that she experiences this with exertion though is minimally  active.  Symptom frequency is relatively low, a few times a month.  Has not found anything that relieves her symptoms but states that they go away on their own within about 15 minutes.  Furthermore patient reports that over the past few months she has noticed sharp right sided neck pain.  Patient has not discussed this with any other provider.  She suspects this pain may be from sleeping in an unusual position and states that pain is reproducible with specific neck movements.  Patient was recently seen by ENT regarding hoarseness of her voice and difficulty swallowing, was referred by her primary care provider.  This visit note also indicates that patient has had facial and dental pain with swelling recently. There was a recommendation from ENT to seen dentist or oral surgeon.   Review of systems complete and found to be negative unless listed in HPI.   EP Information / Studies Reviewed:    EKG is ordered today. Personal review as below.  EKG Interpretation Date/Time:  Thursday February 21 2024 10:01:44 EST Ventricular Rate:  60 PR Interval:  248 QRS Duration:  80 QT Interval:  390 QTC Calculation: 390 R Axis:   72  Text Interpretation: Atrial-paced rhythm with prolonged AV conduction When compared with ECG of 29-Sep-2023 15:56, No significant change was found Confirmed by Trudy Birmingham [phone]) on 02/21/2024 10:04:06 AM    PPM Interrogation-  reviewed in detail today,  See PACEART report.  Arrhythmia/Device History PPM-[employer] [id number] Advantio implanted 02/07/2012  Physical Exam:   VS:  BP 120/64   Pulse 60   Ht 4' 11 (1.499 m)   Wt 120 lb (54.4 kg)   SpO2 98%   BMI 24.24 kg/m  Wt Readings from Last 3 Encounters:  02/21/24 120 lb (54.4 kg)  09/29/23 126 lb 1.7 oz (57.2 kg)  01/31/22 126 lb (57.2 kg)     GEN: No acute distress  NECK: No JVD; No carotid bruits appreciated CARDIAC: Regular rate and rhythm. Faint systolic murmur.  RESPIRATORY:  Clear to auscultation  without rales, wheezing or rhonchi  ABDOMEN: Soft, non-tender, non-distended EXTREMITIES:  No edema; No deformity   ASSESSMENT AND PLAN:    SND s/p [employer] PPM  Patient has a Environmental Manager [phone] Advantio implanted 02/07/2012.  This device is on advisory for early activation of safety mode, secondary to risk of abnormal battery behavior. Per recommendation from [employer], no device interrogation today given risk of activating safety mode. Given that 2 years ago, est battery life was ~3.21yrs, recommends generator change-out. Case reviewed with Dr. Almetta, scheduled 02/26/24. Check pre-procedure labs today. Letter and soap given. Discussed indication for procedure with patient as well as potential complications including but not limited to infection and bleeding.   Paroxysmal atrial fibrillation Prior office visit notes indicate that we have been monitoring patient's device for incidence of atrial fibrillation, though not clear to me if this is ever truly been documented in the past. AP/VS on ECG today. No interrogation, see above.  Non-obstructive CAD Patient with known significant vascular disease had LHC in 2014 showing non-obstructive CAD. Has been lost to follow up with cardiology for several years. Today reports developing nighttime chest pressure with dyspnea this summer, now experiences a few times per month. No exertional component though is minimally active with weakness. Symptoms are concerning for angina. EKG today without obvious ischemic changes. Discussed patient with DOD Dr. Wonda today, will start Imdur  15mg  and arrange general cardiology follow up. Could consider Myoview  stress testing.   Hypertension Blood pressure is well-controlled on current antihypertensive regimen. Continue current medications and dosing. Adding Imdur  15mg  as above.  Poor dentition Recent ENT notes from 1/16 document concerns for patient's poor dentition causing recent facial pain  and swelling. No systemic symptoms reported today. Does have right sided neck pain that could be referred. Of note, also on chronic prednisone  for RA. CBC drawn today. Will make Dr. Almetta aware.   Peripheral vascular disease Follows annually with vascular surgery.   Hyperlipidemia Continue statin.   Tobacco use Still smokes occasionally. Cessation strongly encouraged today.   Healthcare access limitation Patient reports significant challenge with access to transportation to her appointments. She is interested in speaking with LCSW, will place referral.      Disposition:   Follow up with EP Team as usual post procedure. Next available appointment with general cardiology to re-establish.   Signed, Artist Pouch, PA-C  "

## 2024-02-21 NOTE — Telephone Encounter (Signed)
 Re-entered lab orders for lab, due to requisition number not working. [facility] stated it was fixed and would contact us  if needing anything further.

## 2024-02-21 NOTE — Patient Instructions (Signed)
 Medication Instructions:   START TAKING :  IMDUR  15 MG ONCE A  DAY    *If you need a refill on your cardiac medications before your next appointment, please call your pharmacy*   Lab Work:    PLEASE GO DOWN STAIRS  [facility]  FIRST FLOOR   ( GET OFF ELEVATORS WALK TOWARDS WAITING AREA LAB LOCATED BY PHARMACY):  BMET AND CBC TODAY      If you have labs (blood work) drawn today and your tests are completely normal, you will receive your results only by: MyChart Message (if you have MyChart) OR A paper copy in the mail If you have any lab test that is abnormal or we need to change your treatment, we will call you to review the results.    Testing/Procedures:  SEE LETTERS  ATTACHED FOR PROCEDURES      Follow-Up: At [facility], you and your health needs are our priority.  As part of our continuing mission to provide you with exceptional heart care, our providers are all part of one team.  This team includes your primary Cardiologist (physician) and Advanced Practice Providers or APPs (Physician Assistants and Nurse Practitioners) who all work together to provide you with the care you need, when you need it.  Your next appointment:  NEXT AVAILABLE   Provider:   One of our Advanced Practice Providers (APPs): Morse Clause, PA-C  Hanh Waddell Daniels, PA-C  Saddie Cleaves, NP  Olivia Pavy, PA-C Miriam Shams, NP  Leontine Salen, PA-C Josefa Beauvais, NP  [facility], PA-C [place], PA-C  [place], PA-C [place], [place]  Damien Braver, NP Jon Hails, PA-C  Waddell Donath, PA-C Dayna Dunn, PA-C  [place], PA-C [place], [place] Glendia Ferrier, PA-C Callie Goodrich, PA-C  Katlyn West, NP Thom Sluder, PA-C  Alyssa White, NP Rollo Louder, PA-C Xika Zhao, NP    Lamarr Satterfield, NP            We recommend signing up for the patient portal called MyChart.  Sign up information is provided on this After Visit Summary.  MyChart is used to connect with patients for  Virtual Visits (Telemedicine).  Patients are able to view lab/test results, encounter notes, upcoming appointments, etc.  Non-urgent messages can be sent to your provider as well.   To learn more about what you can do with MyChart, go to [email or web address].   Other Instructions

## 2024-02-22 ENCOUNTER — Telehealth: Payer: Self-pay | Admitting: Licensed Clinical Social Worker

## 2024-02-22 ENCOUNTER — Ambulatory Visit: Payer: Self-pay | Admitting: Cardiology

## 2024-02-22 LAB — CBC
Hematocrit: 33.2 % — ABNORMAL LOW (ref 34.0–46.6)
Hemoglobin: 10.4 g/dL — ABNORMAL LOW (ref 11.1–15.9)
MCH: 31.4 pg (ref 26.6–33.0)
MCHC: 31.3 g/dL — ABNORMAL LOW (ref 31.5–35.7)
MCV: 100 fL — ABNORMAL HIGH (ref 79–97)
Platelets: 254 10*3/uL (ref 150–450)
RBC: 3.31 x10E6/uL — ABNORMAL LOW (ref 3.77–5.28)
RDW: 13.8 % (ref 11.7–15.4)
WBC: 6.2 10*3/uL (ref 3.4–10.8)

## 2024-02-22 LAB — BASIC METABOLIC PANEL WITH GFR
BUN/Creatinine Ratio: 12 (ref 12–28)
BUN: 10 mg/dL (ref 10–36)
CO2: 20 mmol/L (ref 20–29)
Calcium: 8.9 mg/dL (ref 8.7–10.3)
Chloride: 106 mmol/L (ref 96–106)
Creatinine, Ser: 0.86 mg/dL (ref 0.57–1.00)
Glucose: 85 mg/dL (ref 70–99)
Potassium: 4.1 mmol/L (ref 3.5–5.2)
Sodium: 142 mmol/L (ref 134–144)
eGFR: 63 mL/min/{1.73_m2}

## 2024-02-25 ENCOUNTER — Telehealth: Payer: Self-pay

## 2024-02-25 NOTE — Pre-Procedure Instructions (Signed)
 Instructed patient on the following items: Arrival time 0730- new arrival time Nothing to eat or drink after midnight No meds AM of procedure Responsible person to drive you home and stay with you for 24 hrs Wash with special soap night before and morning of procedure

## 2024-02-25 NOTE — Telephone Encounter (Signed)
 Pt called me back and stated that her Darden and Daughter are bringing her to the hospital - Her Daughter will take her home and stay with her.   She said that's the plan for now and if anything changes she will call and let us  know.  I told her if it did change and she didn't have anyone to take her home or to stay with her we could keep her overnight. She understands and will let us  know if we need to plan on keeping her.

## 2024-02-26 ENCOUNTER — Encounter (HOSPITAL_COMMUNITY)
Admission: RE | Disposition: A | Discharge status: Home / Self Care | Payer: Self-pay | Source: Home / Self Care | Attending: Student in an Organized Health Care Education/Training Program

## 2024-02-26 ENCOUNTER — Ambulatory Visit (HOSPITAL_COMMUNITY)
Admission: RE | Admit: 2024-02-26 | Source: Home / Self Care | Admitting: Student in an Organized Health Care Education/Training Program

## 2024-02-26 ENCOUNTER — Other Ambulatory Visit: Payer: Self-pay

## 2024-02-26 DIAGNOSIS — I480 Paroxysmal atrial fibrillation: Secondary | ICD-10-CM | POA: Insufficient documentation

## 2024-02-26 DIAGNOSIS — F17210 Nicotine dependence, cigarettes, uncomplicated: Secondary | ICD-10-CM | POA: Insufficient documentation

## 2024-02-26 DIAGNOSIS — M069 Rheumatoid arthritis, unspecified: Secondary | ICD-10-CM | POA: Insufficient documentation

## 2024-02-26 DIAGNOSIS — I495 Sick sinus syndrome: Secondary | ICD-10-CM | POA: Insufficient documentation

## 2024-02-26 DIAGNOSIS — I2510 Atherosclerotic heart disease of native coronary artery without angina pectoris: Secondary | ICD-10-CM | POA: Insufficient documentation

## 2024-02-26 DIAGNOSIS — Z45010 Encounter for checking and testing of cardiac pacemaker pulse generator [battery]: Secondary | ICD-10-CM | POA: Insufficient documentation

## 2024-02-26 DIAGNOSIS — E785 Hyperlipidemia, unspecified: Secondary | ICD-10-CM | POA: Insufficient documentation

## 2024-02-26 DIAGNOSIS — I739 Peripheral vascular disease, unspecified: Secondary | ICD-10-CM | POA: Insufficient documentation

## 2024-02-26 DIAGNOSIS — Z79899 Other long term (current) drug therapy: Secondary | ICD-10-CM | POA: Insufficient documentation

## 2024-02-26 DIAGNOSIS — I10 Essential (primary) hypertension: Secondary | ICD-10-CM | POA: Insufficient documentation

## 2024-02-26 DIAGNOSIS — Z7952 Long term (current) use of systemic steroids: Secondary | ICD-10-CM | POA: Insufficient documentation

## 2024-02-26 MED ORDER — SODIUM CHLORIDE 0.9 % IV SOLN
INTRAVENOUS | Status: AC
  Administered 2024-02-26: 80 mg
  Filled 2024-02-26: qty 2

## 2024-02-26 MED ORDER — LIDOCAINE HCL (PF) 1 % IJ SOLN
INTRAMUSCULAR | Status: AC
  Filled 2024-02-26: qty 60

## 2024-02-26 MED ORDER — CHLORHEXIDINE GLUCONATE 4 % EX SOLN: 4.0000 | Freq: Once | CUTANEOUS | Status: DC

## 2024-02-26 MED ORDER — LIDOCAINE HCL (PF) 1 % IJ SOLN
INTRAMUSCULAR | Status: DC | PRN
  Administered 2024-02-26: 60 mL

## 2024-02-26 MED ORDER — CEFAZOLIN SODIUM-DEXTROSE 2-4 GM/100ML-% IV SOLN
INTRAVENOUS | Status: AC
  Administered 2024-02-26: 2 g via INTRAVENOUS
  Filled 2024-02-26: qty 100

## 2024-02-26 MED ORDER — CEFAZOLIN SODIUM-DEXTROSE 2-4 GM/100ML-% IV SOLN: 2.0000 g | INTRAVENOUS | Status: AC

## 2024-02-26 MED ORDER — SODIUM CHLORIDE 0.9 % IV SOLN: INTRAVENOUS | Status: DC

## 2024-02-26 MED ORDER — SODIUM CHLORIDE 0.9 % IV SOLN: 80.0000 mg | INTRAVENOUS | Status: AC

## 2024-02-26 MED ORDER — POVIDONE-IODINE 10 % EX SWAB: 2.0000 | Freq: Once | CUTANEOUS | Status: DC

## 2024-02-26 NOTE — Op Note (Signed)
" ° °  Procedure:  LEFT sided BSCI DC-PM generator change  Pre-op Diagnosis: SND   Post-op Diagnosis: same  Procedure Date: 02/26/24  Attending: Adina Primus, MD   Anesthesia: monitored anesthesia care  Procedure Report:  The LEFT shoulder was prepped with chlorhexidine  scrub. 30 cc lidocaine  was injected at the planned incision site. The subcutaneous tissue was dissected with electrocautery and blunt dissection. The pocket overlying the device was dissected to allow for removal of the device. The leads were then removed from the implanted device.  The new device was then connected to the existing leads. The pocket was irrigated with Gentamicin  solution. A TYRX pouch was used.  The pocket was closed with continuous 2-0 and 3-0 StrataFix suture. Dermabond was used to close the superficial layer.    The device was evaluated by the representative of the cardiac device manufacturer under the supervision of the attending physician with implant parameters listed below.   Complications: none Estimated blood loss: less than 50 mL  Implanted Hardware: Implant Name Type Inv. Item Serial No. Manufacturer Lot No. LRB No. Used Action  POUCH AIGIS-R ANTIBACT PPM MED - [id number] Mesh General POUCH AIGIS-R ANTIBACT PPM MED  [employer] [id number] N/A 1 Implanted  PACEMAKER ACCOLADE GR - [id number] Pacemaker PACEMAKER ACCOLADE GR [id number] [employer]  N/A 1 Implanted  BOST [id number] [employer]IO [id number]   [id number] [employer]   1 Explanted   Device Information: PPM Generator: BSCI Accolade MRI EL L331, SN: [id number], DOI 02/26/24  Lead Interrogation Data: RA: 3.9 mV / 445 ohms / 0.7 V @ 0.4 ms RV: 10.4 mV / 527 ohms / 1.4 V @ 0.4 ms  Summary: 1. Successful LEFT BSCI DC PPM generator replacement  Recommendations: Routine post-procedure care with bedrest for 1 hour No heparin  (IV or subcutaneous) for 48 hours. No enoxaparin  (IV or subcutaneous) for 7 days.  Discharge after  post-procedure bedrest.  Donnice DELENA Primus, MD [facility]  Cardiac Electrophysiology      "

## 2024-02-26 NOTE — Discharge Instructions (Signed)

## 2024-02-28 ENCOUNTER — Encounter (HOSPITAL_COMMUNITY): Payer: Self-pay | Admitting: Student in an Organized Health Care Education/Training Program

## 2024-02-28 ENCOUNTER — Encounter: Payer: Self-pay | Admitting: Emergency Medicine

## 2024-03-11 ENCOUNTER — Ambulatory Visit

## 2024-04-07 ENCOUNTER — Ambulatory Visit: Admitting: Emergency Medicine

## 2024-04-08 ENCOUNTER — Ambulatory Visit

## 2024-05-27 ENCOUNTER — Ambulatory Visit: Admitting: Student

## 2024-07-08 ENCOUNTER — Ambulatory Visit

## 2024-10-07 ENCOUNTER — Ambulatory Visit

## 2025-01-06 ENCOUNTER — Ambulatory Visit

## 2025-04-07 ENCOUNTER — Ambulatory Visit

## 2025-07-07 ENCOUNTER — Ambulatory Visit
